# Patient Record
Sex: Female | Born: 1970 | Race: White | Hispanic: No | Smoking: Former smoker
Health system: Southern US, Community
[De-identification: ages and names within clinical notes are randomized; demographics above are authoritative.]

## PROBLEM LIST (undated history)

## (undated) DIAGNOSIS — R112 Nausea with vomiting, unspecified: Secondary | ICD-10-CM

## (undated) DIAGNOSIS — F419 Anxiety disorder, unspecified: Secondary | ICD-10-CM

## (undated) DIAGNOSIS — M1990 Unspecified osteoarthritis, unspecified site: Secondary | ICD-10-CM

## (undated) DIAGNOSIS — Z9889 Other specified postprocedural states: Secondary | ICD-10-CM

## (undated) DIAGNOSIS — M519 Unspecified thoracic, thoracolumbar and lumbosacral intervertebral disc disorder: Secondary | ICD-10-CM

## (undated) DIAGNOSIS — T7840XA Allergy, unspecified, initial encounter: Secondary | ICD-10-CM

## (undated) DIAGNOSIS — I10 Essential (primary) hypertension: Secondary | ICD-10-CM

## (undated) DIAGNOSIS — F32A Depression, unspecified: Secondary | ICD-10-CM

## (undated) DIAGNOSIS — D219 Benign neoplasm of connective and other soft tissue, unspecified: Secondary | ICD-10-CM

## (undated) DIAGNOSIS — J45909 Unspecified asthma, uncomplicated: Secondary | ICD-10-CM

## (undated) DIAGNOSIS — R32 Unspecified urinary incontinence: Secondary | ICD-10-CM

## (undated) DIAGNOSIS — Z8739 Personal history of other diseases of the musculoskeletal system and connective tissue: Secondary | ICD-10-CM

## (undated) DIAGNOSIS — N939 Abnormal uterine and vaginal bleeding, unspecified: Secondary | ICD-10-CM

## (undated) DIAGNOSIS — M75100 Unspecified rotator cuff tear or rupture of unspecified shoulder, not specified as traumatic: Secondary | ICD-10-CM

## (undated) DIAGNOSIS — K219 Gastro-esophageal reflux disease without esophagitis: Secondary | ICD-10-CM

## (undated) DIAGNOSIS — F329 Major depressive disorder, single episode, unspecified: Secondary | ICD-10-CM

## (undated) DIAGNOSIS — K5792 Diverticulitis of intestine, part unspecified, without perforation or abscess without bleeding: Secondary | ICD-10-CM

## (undated) DIAGNOSIS — R011 Cardiac murmur, unspecified: Secondary | ICD-10-CM

## (undated) HISTORY — DX: Unspecified thoracic, thoracolumbar and lumbosacral intervertebral disc disorder: M51.9

## (undated) HISTORY — PX: DIAGNOSTIC LAPAROSCOPY: SUR761

## (undated) HISTORY — PX: MOUTH SURGERY: SHX715

## (undated) HISTORY — PX: GYNECOLOGIC CRYOSURGERY: SHX857

## (undated) HISTORY — PX: PELVIC LAPAROSCOPY: SHX162

## (undated) HISTORY — PX: COLPOSCOPY: SHX161

## (undated) HISTORY — DX: Unspecified urinary incontinence: R32

## (undated) HISTORY — DX: Anxiety disorder, unspecified: F41.9

## (undated) HISTORY — PX: SHOULDER ARTHROSCOPY: SHX128

## (undated) HISTORY — PX: KNEE ARTHROSCOPY: SHX127

## (undated) HISTORY — PX: APPENDECTOMY: SHX54

## (undated) HISTORY — DX: Essential (primary) hypertension: I10

## (undated) HISTORY — DX: Abnormal uterine and vaginal bleeding, unspecified: N93.9

## (undated) HISTORY — DX: Unspecified rotator cuff tear or rupture of unspecified shoulder, not specified as traumatic: M75.100

## (undated) HISTORY — DX: Unspecified asthma, uncomplicated: J45.909

## (undated) HISTORY — DX: Personal history of other diseases of the musculoskeletal system and connective tissue: Z87.39

## (undated) HISTORY — DX: Diverticulitis of intestine, part unspecified, without perforation or abscess without bleeding: K57.92

## (undated) HISTORY — DX: Allergy, unspecified, initial encounter: T78.40XA

## (undated) HISTORY — PX: ANOPLASTY: SHX426

## (undated) HISTORY — DX: Depression, unspecified: F32.A

## (undated) HISTORY — DX: Cardiac murmur, unspecified: R01.1

## (undated) HISTORY — DX: Benign neoplasm of connective and other soft tissue, unspecified: D21.9

## (undated) HISTORY — DX: Major depressive disorder, single episode, unspecified: F32.9

## (undated) SURGERY — Surgical Case
Anesthesia: *Unknown

---

## 1998-03-23 ENCOUNTER — Other Ambulatory Visit: Admission: RE | Admit: 1998-03-23 | Discharge: 1998-03-23 | Payer: Self-pay | Admitting: Obstetrics & Gynecology

## 1998-06-19 ENCOUNTER — Other Ambulatory Visit: Admission: RE | Admit: 1998-06-19 | Discharge: 1998-06-19 | Payer: Self-pay | Admitting: Obstetrics & Gynecology

## 1999-05-14 ENCOUNTER — Other Ambulatory Visit: Admission: RE | Admit: 1999-05-14 | Discharge: 1999-05-14 | Payer: Self-pay | Admitting: Obstetrics and Gynecology

## 2000-04-22 ENCOUNTER — Other Ambulatory Visit: Admission: RE | Admit: 2000-04-22 | Discharge: 2000-04-22 | Payer: Self-pay | Admitting: Obstetrics and Gynecology

## 2002-08-30 ENCOUNTER — Other Ambulatory Visit: Admission: RE | Admit: 2002-08-30 | Discharge: 2002-08-30 | Payer: Self-pay | Admitting: Obstetrics and Gynecology

## 2002-12-22 ENCOUNTER — Observation Stay (HOSPITAL_COMMUNITY): Admission: AD | Admit: 2002-12-22 | Discharge: 2002-12-23 | Payer: Self-pay | Admitting: Obstetrics and Gynecology

## 2003-01-17 ENCOUNTER — Inpatient Hospital Stay (HOSPITAL_COMMUNITY): Admission: AD | Admit: 2003-01-17 | Discharge: 2003-01-19 | Payer: Self-pay | Admitting: Obstetrics and Gynecology

## 2003-01-17 ENCOUNTER — Encounter: Payer: Self-pay | Admitting: Obstetrics and Gynecology

## 2003-02-03 ENCOUNTER — Inpatient Hospital Stay (HOSPITAL_COMMUNITY): Admission: AD | Admit: 2003-02-03 | Discharge: 2003-02-05 | Payer: Self-pay | Admitting: Obstetrics and Gynecology

## 2003-02-05 ENCOUNTER — Encounter: Payer: Self-pay | Admitting: Obstetrics and Gynecology

## 2003-02-25 ENCOUNTER — Inpatient Hospital Stay (HOSPITAL_COMMUNITY): Admission: AD | Admit: 2003-02-25 | Discharge: 2003-02-25 | Payer: Self-pay | Admitting: Obstetrics and Gynecology

## 2003-02-26 ENCOUNTER — Inpatient Hospital Stay (HOSPITAL_COMMUNITY): Admission: AD | Admit: 2003-02-26 | Discharge: 2003-03-02 | Payer: Self-pay | Admitting: Obstetrics and Gynecology

## 2003-03-03 ENCOUNTER — Encounter: Admission: RE | Admit: 2003-03-03 | Discharge: 2003-04-02 | Payer: Self-pay | Admitting: Obstetrics and Gynecology

## 2003-03-30 ENCOUNTER — Other Ambulatory Visit: Admission: RE | Admit: 2003-03-30 | Discharge: 2003-03-30 | Payer: Self-pay | Admitting: Obstetrics and Gynecology

## 2004-01-27 ENCOUNTER — Other Ambulatory Visit: Admission: RE | Admit: 2004-01-27 | Discharge: 2004-01-27 | Payer: Self-pay | Admitting: Obstetrics and Gynecology

## 2004-08-26 ENCOUNTER — Emergency Department (HOSPITAL_COMMUNITY): Admission: EM | Admit: 2004-08-26 | Discharge: 2004-08-26 | Payer: Self-pay | Admitting: Emergency Medicine

## 2005-01-04 ENCOUNTER — Other Ambulatory Visit: Admission: RE | Admit: 2005-01-04 | Discharge: 2005-01-04 | Payer: Self-pay | Admitting: Obstetrics and Gynecology

## 2005-04-09 ENCOUNTER — Ambulatory Visit (HOSPITAL_COMMUNITY): Admission: RE | Admit: 2005-04-09 | Discharge: 2005-04-09 | Payer: Self-pay | Admitting: Obstetrics and Gynecology

## 2005-04-18 ENCOUNTER — Inpatient Hospital Stay (HOSPITAL_COMMUNITY): Admission: AD | Admit: 2005-04-18 | Discharge: 2005-04-18 | Payer: Self-pay | Admitting: Obstetrics and Gynecology

## 2005-08-08 ENCOUNTER — Ambulatory Visit: Payer: Self-pay | Admitting: Internal Medicine

## 2005-08-13 ENCOUNTER — Ambulatory Visit: Payer: Self-pay

## 2005-08-15 ENCOUNTER — Ambulatory Visit: Payer: Self-pay

## 2005-09-01 ENCOUNTER — Inpatient Hospital Stay (HOSPITAL_COMMUNITY): Admission: AD | Admit: 2005-09-01 | Discharge: 2005-09-04 | Payer: Self-pay | Admitting: Obstetrics and Gynecology

## 2005-10-07 ENCOUNTER — Other Ambulatory Visit: Admission: RE | Admit: 2005-10-07 | Discharge: 2005-10-07 | Payer: Self-pay | Admitting: Obstetrics and Gynecology

## 2006-09-08 ENCOUNTER — Encounter: Payer: Self-pay | Admitting: Orthopedic Surgery

## 2006-09-08 ENCOUNTER — Ambulatory Visit (HOSPITAL_COMMUNITY): Admission: RE | Admit: 2006-09-08 | Discharge: 2006-09-08 | Payer: Self-pay | Admitting: Orthopedic Surgery

## 2006-11-05 ENCOUNTER — Ambulatory Visit (HOSPITAL_COMMUNITY): Admission: RE | Admit: 2006-11-05 | Discharge: 2006-11-05 | Payer: Self-pay | Admitting: Orthopedic Surgery

## 2006-11-21 ENCOUNTER — Ambulatory Visit (HOSPITAL_COMMUNITY): Admission: RE | Admit: 2006-11-21 | Discharge: 2006-11-21 | Payer: Self-pay | Admitting: Orthopedic Surgery

## 2009-05-26 DIAGNOSIS — R0602 Shortness of breath: Secondary | ICD-10-CM | POA: Insufficient documentation

## 2009-05-26 DIAGNOSIS — R011 Cardiac murmur, unspecified: Secondary | ICD-10-CM | POA: Insufficient documentation

## 2009-05-26 DIAGNOSIS — J309 Allergic rhinitis, unspecified: Secondary | ICD-10-CM | POA: Insufficient documentation

## 2009-05-26 DIAGNOSIS — D649 Anemia, unspecified: Secondary | ICD-10-CM | POA: Insufficient documentation

## 2009-05-26 DIAGNOSIS — R002 Palpitations: Secondary | ICD-10-CM | POA: Insufficient documentation

## 2009-07-12 ENCOUNTER — Ambulatory Visit (HOSPITAL_COMMUNITY): Admission: RE | Admit: 2009-07-12 | Discharge: 2009-07-12 | Payer: Self-pay | Admitting: Obstetrics

## 2009-08-18 ENCOUNTER — Encounter: Admission: RE | Admit: 2009-08-18 | Discharge: 2009-08-18 | Payer: Self-pay | Admitting: Family Medicine

## 2011-01-13 ENCOUNTER — Encounter: Payer: Self-pay | Admitting: Orthopaedic Surgery

## 2011-01-14 ENCOUNTER — Encounter: Payer: Self-pay | Admitting: Family Medicine

## 2011-05-10 NOTE — H&P (Signed)
NAME:  Patricia Newton, Patricia Newton NO.:  [id number]   MEDICAL RECORD NO.:  [id number]                   PATIENT TYPE:   LOCATION:                                       FACILITY:   PHYSICIAN:  Juluis Mire, M.D.                DATE OF BIRTH:  10/01/1955   DATE OF ADMISSION:  01/17/2003  DATE OF DISCHARGE:                                HISTORY & PHYSICAL   HISTORY OF PRESENT ILLNESS:  This is a 40 year old gravida 1, para 0,  married white female, last menstrual period of 06/17/02, giving an estimated  date of confinement of 03/27/03.  This gives her an estimated gestational age  of 32-1/2 weeks.  This is consistent with initial examination here in the  office at nine weeks, and prior early ultrasound done at 17 weeks.  She is  admitted at the present time with preterm uterine activity requiring  tocolysis.   In relation to the present admission, the patient called the office  complaining of increasing back ache and discomfort.  Was subsequently seen  in triage where regular uterine activity was noted.  Her cervix remained  fingertip and long.  Despite subcutaneous and p.o. terbutaline, she  continued to have regular uterine activity, and was admitted for magnesium  sulfate.  There was no documented cervical change during this time.  It is  of note at her last visit in the office on 01/17/03, it was noted that she  had upper limits of normal amniotic fluid consistent with mild  polyhydramnios.  This may be somewhat related to her preterm uterine  activity.  No further evaluation had been undertaken at that time.  Other  issues, she did have an elevated 50 g Glucola.  Her three hour glucose  tolerance test was within normal limits.   ALLERGIES:  1. PENICILLIN.  2. ERYTHROMYCIN.  3. BENADRYL.   MEDICATIONS:  1. Prenatal vitamins.  2. Terbutaline p.r.n.   PAST MEDICAL HISTORY:  Please see prenatal record.  She does have a history  of a cardiac murmur, and  does not take SBE prophylaxis.   FAMILY HISTORY:  Please see prenatal record.   SOCIAL HISTORY:  Please see prenatal record.   REVIEW OF SYMPTOMS:  Noncontributory.   PHYSICAL EXAMINATION:  VITAL SIGNS:  The patient is afebrile with stable  vital signs.  HEENT:  Normocephalic, atraumatic.  Pupils equal, round, reactive to light  and accommodation.  Extraocular movements were intact.  Sclerae and  conjunctivae are clear.  Oropharynx clear.  NECK:  Without thyromegaly.  BREASTS:  Not examined.  LUNGS:  Clear.  HEART:  Regular rate, 2/6 systolic ejection murmur, no clicks or gallops.  ABDOMEN:  Gravida uterus, slightly increased compared to dates.  Nontender.  Fetal heart tones are audible.  On monitoring, she does have regular uterine  activity, fetal heart is reactive with no decelerations.  PELVIC:  Cervix is long  and fingertip.  Membranes are intact.  No active  bleeding.  EXTREMITIES:  Trace edema.  NEUROLOGIC:  Grossly within normal limits.  Deep tendon reflexes are 2+, no  clonus.   IMPRESSION:  1. Intrauterine pregnancy at 32+ weeks with pre-term uterine activity.  2. Polyhydramnios.   PLAN:  The patient will be admitted for tocolysis and magnesium sulfate.  She will be given IV Cleocin.  Group B Strep cultures obtained.  We will  obtain a followup ultrasound and reassess.                                                 Juluis Mire, M.D.    JSM/MEDQ  D:  01/18/2003  T:  01/18/2003  Job:  [id number]

## 2011-05-10 NOTE — Discharge Summary (Signed)
   NAMEAAMNA, MALLOZZI NO.:  [id number]   MEDICAL RECORD NO.:  [id number]                   PATIENT TYPE:  INP   LOCATION:  9160                                 FACILITY:  [facility]   PHYSICIAN:  Juluis Mire, M.D.                DATE OF BIRTH:  March 24, 1971   DATE OF ADMISSION:  02/03/2003  DATE OF DISCHARGE:  02/05/2003                                 DISCHARGE SUMMARY   ADMITTING DIAGNOSIS:  Intrauterine pregnancy at 14 and 6/7ths weeks with  preterm uterine activity.   DISCHARGE DIAGNOSIS:  Intrauterine pregnancy at 59 and 6/7ths weeks with  preterm uterine activity.   HISTORY AND PHYSICAL:  For complete history and physical, see dictated note.   COURSE IN HOSPITAL:  The patient was admitted for preterm management, and  begun on magnesium sulfate.  Was given betamethasone and IV antibiotics.  Subsequently was converted over to terbutaline pump with good resolution of  uterine activity and weaned of magnesium.  On the morning of discharge,  fetal heart rate did have one deceleration with contraction.  Subsequent  biophysical profile was 8/8 with normal Doppler flow studies and normal  amniotic fluid volume.  The remaining tracing looked reactive without  decelerations.  The patient was discharged home at that time to continue  terbutaline pump at home.   There were no complications during the patient's stay in the hospital.  The  patient was discharged home in stable condition.   The patient will continue on terbutaline pump at home.  She is to be at  rest.  Call with increase in uterine activity, preterm labor.  Warning signs  were given.  Will follow up in the office on Tuesday.                                               Juluis Mire, M.D.    JSM/MEDQ  D:  02/05/2003  T:  02/05/2003  Job:  [id number]

## 2011-05-10 NOTE — H&P (Signed)
   NAME:  BERLIN, VIERECK NO.:  [id number]   MEDICAL RECORD NO.:  [id number]                   PATIENT TYPE:   LOCATION:                                       [facility]ILITY:  [facility]   PHYSICIAN:  Dineen Kid. Rana Snare, M.D.                 DATE OF BIRTH:  14-Sep-1971   DATE OF ADMISSION:  DATE OF DISCHARGE:                                HISTORY & PHYSICAL   HISTORY OF PRESENT ILLNESS:  The patient is a 40 year old G1, P0 at 74 and  6/7 weeks who presented to the office complaining of cramping.  Pregnancy  has been complicated by preterm labor and polyhydramnios.  Ultrasound on  January 31, 2003 shows amniotic fluid index at 27.1 which is 97th  percentile.  She has also been on bed rest and terbutaline q.4h.  In the  office she seemed to be contracting every five to 10 minutes with  irritability in between.  Upon presentation to [facility] she was  given subcutaneous terbutaline twice with only minimal resolution of the  irritability and contractions.  Her cervix was 1, 25%, and high and the  patient was admitted for magnesium sulfate due to persistent contractions  despite terbutaline.  Pregl in pregnancy she had a fetal fibronectin which  was negative.  She has not had one done recently and unknown group B Strep.  However, she was treated with antibiotics last admission.  Estimated date of  confinement is March 27, 2003.   ALLERGIES:  PENICILLIN, BENADRYL, and ERYTHROMYCIN.   CURRENT MEDICATIONS:  1. Terbutaline 2.5 q.4h.  2. Prenatal vitamins.   PHYSICAL EXAMINATION:  VITAL SIGNS:  Blood pressure 122/60.  HEART:  Regular rate and rhythm.  ABDOMEN:  Gravid, nontender.  Fundal height is 35 cm.  PELVIC:  Cervix is 1, 25%, and high.   IMPRESSION AND PLAN:  Intrauterine pregnancy at 68 and 6/7 weeks with  preterm labor not responsive to terbutaline.   PLAN:  Magnesium sulfate tocolysis.  Once quiescent plan to switch her to  terbutaline pump.  Will go ahead  and give her betamethasone shots for  acceleration of fetal lung maturity and will check a group B Strep culture  and treat her with clindamycin prophylactically until the results return.                                               Dineen Kid Rana Snare, M.D.    DCL/MEDQ  D:  02/03/2003  T:  02/03/2003  Job:  [id number]

## 2011-05-10 NOTE — Discharge Summary (Signed)
NAMERIVERLYN, Patricia Newton                       ACCOUNT NO.:  [id number]   MEDICAL RECORD NO.:  [id number]                   PATIENT TYPE:  OBV   LOCATION:  9110                                 FACILITY:  [facility]   PHYSICIAN:  Duke Salvia. Marcelle Overlie, M.D.            DATE OF BIRTH:  February 14, 1971   DATE OF ADMISSION:  12/22/2002  DATE OF DISCHARGE:  12/23/2002                                 DISCHARGE SUMMARY   ADMISSION DIAGNOSES:  1. Intrauterine pregnancy at 26-1/2 weeks.  2. Acute gastroenteritis, bacterial versus viral.   HISTORY OF PRESENT ILLNESS:  Please see the admission H&P.  Briefly, a 40-  year-old at 26-1/2 weeks with sudden onset of nausea, vomiting, diarrhea and  abdominal cramping after eating out at a restaurant earlier the day of  admission.  She had some brief bloody, mucousy diarrhea.   HOSPITAL COURSE:  Her temperature on admission was 99.8.  An IV was started.  She was given anti-emetics and started on Septra for possible Salmonella,  and stool was sent for ova and parasites.  Her main problem during  hospitalization was some abdominal cramping although by the a.m. of December 23, 2001, her diarrhea pretty much stopped.  She was having some abdominal  cramping but was tolerating liquids at that point with only occasional IV  Phenergan dose for nausea.  She did receive Donnatal elixir that she was  using for colon cramps which did seem to help her significantly.  By the  afternoon of December 23, 2001, at 5:30 p.m. she was ready for discharge.  She  was afebrile at that point, keeping liquids down with stable fetal heart  rate with only some mild irritability that had all but stopped.   LABORATORY DATA:  CBC on admission, WBC 12.1, hemoglobin 10.7, hematocrit  31.1, platelets 215,000.  SMA-23 was normal including normal amylase and  lipase.  Stool for ova and parasites pending.  Urinalysis showed 500 glucose  with 40 ketones, otherwise negative.   DISPOSITION:  The patient  is discharged on Septra DS one p.o. b.i.d. x7 days  pending the stool culture.  She was advised a bland diet and liquids  including sports drinks to replenish sodium and potassium.  Phenergan p.r.n.  nausea and Donnatal elixir 60 cc  - 5 cc p.o. t.i.d. for abdominal cramps.  She will return to the office in one week.  Advised to report any recurrence  of significant nausea, vomiting or bloody diarrhea or increased uterine  irritability greater than four to five contractions per hour.   CONDITION ON DISCHARGE:  Good.   ACTIVITY:  Rest at home with graded increase in her activity.                                               Richard  Milana Obey, M.D.    RMH/MEDQ  D:  12/23/2002  T:  12/23/2002  Job:  [id number]

## 2012-03-09 ENCOUNTER — Other Ambulatory Visit: Payer: Self-pay | Admitting: Orthopedic Surgery

## 2012-03-10 ENCOUNTER — Encounter (HOSPITAL_BASED_OUTPATIENT_CLINIC_OR_DEPARTMENT_OTHER): Payer: Self-pay | Admitting: *Deleted

## 2012-03-10 NOTE — Progress Notes (Signed)
Pt states she had some irreg hr-palpatationd during preg-last 06-has had some occ-has not had to see a cardiologist. Denies cp-sob No labs needed

## 2012-03-11 ENCOUNTER — Ambulatory Visit (HOSPITAL_BASED_OUTPATIENT_CLINIC_OR_DEPARTMENT_OTHER): Payer: Managed Care, Other (non HMO) | Admitting: Anesthesiology

## 2012-03-11 ENCOUNTER — Encounter (HOSPITAL_BASED_OUTPATIENT_CLINIC_OR_DEPARTMENT_OTHER): Payer: Self-pay | Admitting: Anesthesiology

## 2012-03-11 ENCOUNTER — Encounter (HOSPITAL_BASED_OUTPATIENT_CLINIC_OR_DEPARTMENT_OTHER)
Admission: RE | Disposition: A | Discharge status: Home / Self Care | Payer: Self-pay | Source: Ambulatory Visit | Attending: Orthopedic Surgery

## 2012-03-11 ENCOUNTER — Encounter (HOSPITAL_BASED_OUTPATIENT_CLINIC_OR_DEPARTMENT_OTHER): Payer: Self-pay | Admitting: Orthopedic Surgery

## 2012-03-11 ENCOUNTER — Ambulatory Visit (HOSPITAL_BASED_OUTPATIENT_CLINIC_OR_DEPARTMENT_OTHER)
Admission: RE | Admit: 2012-03-11 | Discharge: 2012-03-11 | Disposition: A | Discharge status: Home / Self Care | Payer: Managed Care, Other (non HMO) | Source: Ambulatory Visit | Attending: Orthopedic Surgery | Admitting: Orthopedic Surgery

## 2012-03-11 DIAGNOSIS — W269XXA Contact with unspecified sharp object(s), initial encounter: Secondary | ICD-10-CM | POA: Insufficient documentation

## 2012-03-11 DIAGNOSIS — S61209A Unspecified open wound of unspecified finger without damage to nail, initial encounter: Secondary | ICD-10-CM | POA: Insufficient documentation

## 2012-03-11 HISTORY — DX: Other specified postprocedural states: Z98.890

## 2012-03-11 HISTORY — DX: Other specified postprocedural states: R11.2

## 2012-03-11 HISTORY — PX: TENDON EXPLORATION: SHX5112

## 2012-03-11 HISTORY — PX: OTHER SURGICAL HISTORY: SHX169

## 2012-03-11 HISTORY — DX: Unspecified osteoarthritis, unspecified site: M19.90

## 2012-03-11 SURGERY — EXPLORATION, TENDON
Anesthesia: General | Site: Finger | Laterality: Left | Wound class: Clean

## 2012-03-11 MED ORDER — FENTANYL CITRATE 0.05 MG/ML IJ SOLN: 50.0000 ug | INTRAMUSCULAR | Status: DC | PRN

## 2012-03-11 MED ORDER — LORAZEPAM 2 MG/ML IJ SOLN: 1.0000 mg | Freq: Once | INTRAMUSCULAR | Status: DC | PRN

## 2012-03-11 MED ORDER — MIDAZOLAM HCL 5 MG/5ML IJ SOLN
INTRAMUSCULAR | Status: DC | PRN
  Administered 2012-03-11: 2 mg via INTRAVENOUS

## 2012-03-11 MED ORDER — MIDAZOLAM HCL 2 MG/2ML IJ SOLN: 1.0000 mg | INTRAMUSCULAR | Status: DC | PRN

## 2012-03-11 MED ORDER — HYDROMORPHONE HCL PF 1 MG/ML IJ SOLN
0.2500 mg | INTRAMUSCULAR | Status: DC | PRN
  Administered 2012-03-11 (×2): 0.5 mg via INTRAVENOUS

## 2012-03-11 MED ORDER — SCOPOLAMINE 1 MG/3DAYS TD PT72
1.0000 | MEDICATED_PATCH | Freq: Once | TRANSDERMAL | Status: DC
  Administered 2012-03-11: 1.5 mg via TRANSDERMAL

## 2012-03-11 MED ORDER — CEFAZOLIN SODIUM 1-5 GM-% IV SOLN
INTRAVENOUS | Status: DC | PRN
  Administered 2012-03-11: 2 g via INTRAVENOUS

## 2012-03-11 MED ORDER — BUPIVACAINE HCL (PF) 0.25 % IJ SOLN
INTRAMUSCULAR | Status: DC | PRN
  Administered 2012-03-11: 5 mL

## 2012-03-11 MED ORDER — CHLORHEXIDINE GLUCONATE 4 % EX LIQD: 60.0000 mL | Freq: Once | CUTANEOUS | Status: DC

## 2012-03-11 MED ORDER — LACTATED RINGERS IV SOLN
INTRAVENOUS | Status: DC
  Administered 2012-03-11 (×3): via INTRAVENOUS

## 2012-03-11 MED ORDER — LIDOCAINE HCL (CARDIAC) 20 MG/ML IV SOLN
INTRAVENOUS | Status: DC | PRN
  Administered 2012-03-11: 80 mg via INTRAVENOUS

## 2012-03-11 MED ORDER — HYDROCODONE-ACETAMINOPHEN 5-500 MG PO TABS: 1.0000 | ORAL_TABLET | ORAL | Status: AC | PRN

## 2012-03-11 MED ORDER — FENTANYL CITRATE 0.05 MG/ML IJ SOLN
INTRAMUSCULAR | Status: DC | PRN
  Administered 2012-03-11: 100 ug via INTRAVENOUS

## 2012-03-11 MED ORDER — SCOPOLAMINE 1 MG/3DAYS TD PT72: 1.0000 | MEDICATED_PATCH | Freq: Once | TRANSDERMAL | Status: DC

## 2012-03-11 SURGICAL SUPPLY — 77 items
BAG DECANTER FOR FLEXI CONT (MISCELLANEOUS) IMPLANT
BALL CTTN LRG ABS STRL LF (GAUZE/BANDAGES/DRESSINGS)
BANDAGE GAUZE ELAST BULKY 4 IN (GAUZE/BANDAGES/DRESSINGS) ×2 IMPLANT
BLADE MINI RND TIP GREEN BEAV (BLADE) IMPLANT
BLADE SURG 15 STRL LF DISP TIS (BLADE) ×1 IMPLANT
BLADE SURG 15 STRL SS (BLADE) ×2
BNDG CMPR 9X4 STRL LF SNTH (GAUZE/BANDAGES/DRESSINGS) ×1
BNDG COHESIVE 3X5 TAN STRL LF (GAUZE/BANDAGES/DRESSINGS) ×2 IMPLANT
BNDG ESMARK 4X9 LF (GAUZE/BANDAGES/DRESSINGS) ×1 IMPLANT
CHLORAPREP W/TINT 26ML (MISCELLANEOUS) ×2 IMPLANT
CLOTH BEACON ORANGE TIMEOUT ST (SAFETY) ×2 IMPLANT
CORDS BIPOLAR (ELECTRODE) ×2 IMPLANT
COTTONBALL LRG STERILE PKG (GAUZE/BANDAGES/DRESSINGS) IMPLANT
COVER MAYO STAND STRL (DRAPES) ×2 IMPLANT
COVER TABLE BACK 60X90 (DRAPES) ×2 IMPLANT
CUFF TOURNIQUET SINGLE 18IN (TOURNIQUET CUFF) ×1 IMPLANT
DECANTER SPIKE VIAL GLASS SM (MISCELLANEOUS) IMPLANT
DRAIN TLS ROUND 10FR (DRAIN) IMPLANT
DRAPE EXTREMITY T 121X128X90 (DRAPE) ×2 IMPLANT
DRAPE OEC MINIVIEW 54X84 (DRAPES) IMPLANT
DRAPE SURG 17X23 STRL (DRAPES) ×2 IMPLANT
DRSG KUZMA FLUFF (GAUZE/BANDAGES/DRESSINGS) IMPLANT
GAUZE SPONGE 4X4 16PLY XRAY LF (GAUZE/BANDAGES/DRESSINGS) IMPLANT
GAUZE XEROFORM 1X8 LF (GAUZE/BANDAGES/DRESSINGS) ×2 IMPLANT
GLOVE BIO SURGEON STRL SZ 6.5 (GLOVE) ×3 IMPLANT
GLOVE BIOGEL PI IND STRL 6.5 (GLOVE) IMPLANT
GLOVE BIOGEL PI INDICATOR 6.5 (GLOVE) ×1
GLOVE SURG ORTHO 8.0 STRL STRW (GLOVE) ×2 IMPLANT
GOWN BRE IMP PREV XXLGXLNG (GOWN DISPOSABLE) ×2 IMPLANT
GOWN PREVENTION PLUS XLARGE (GOWN DISPOSABLE) ×3 IMPLANT
KWIRE 4.0 X .035IN (WIRE) IMPLANT
LOOP VESSEL MAXI BLUE (MISCELLANEOUS) IMPLANT
NDL KEITH (NEEDLE) IMPLANT
NEEDLE 27GAX1X1/2 (NEEDLE) ×1 IMPLANT
NEEDLE HYPO 22GX1.5 SAFETY (NEEDLE) IMPLANT
NEEDLE KEITH (NEEDLE) IMPLANT
NS IRRIG 1000ML POUR BTL (IV SOLUTION) ×2 IMPLANT
PACK BASIN DAY SURGERY FS (CUSTOM PROCEDURE TRAY) ×2 IMPLANT
PAD CAST 3X4 CTTN HI CHSV (CAST SUPPLIES) ×1 IMPLANT
PADDING CAST ABS 3INX4YD NS (CAST SUPPLIES)
PADDING CAST ABS 4INX4YD NS (CAST SUPPLIES)
PADDING CAST ABS COTTON 3X4 (CAST SUPPLIES) IMPLANT
PADDING CAST ABS COTTON 4X4 ST (CAST SUPPLIES) ×1 IMPLANT
PADDING CAST COTTON 3X4 STRL (CAST SUPPLIES) ×2
SLEEVE SCD COMPRESS KNEE MED (MISCELLANEOUS) ×1 IMPLANT
SPLINT PLASTER CAST XFAST 3X15 (CAST SUPPLIES) IMPLANT
SPLINT PLASTER XTRA FASTSET 3X (CAST SUPPLIES)
SPONGE GAUZE 4X4 12PLY (GAUZE/BANDAGES/DRESSINGS) ×2 IMPLANT
STOCKINETTE 4X48 STRL (DRAPES) ×2 IMPLANT
SUT CHROMIC 5 0 P 3 (SUTURE) IMPLANT
SUT ETHIBOND 3-0 V-5 (SUTURE) IMPLANT
SUT FIBERWIRE 2-0 18 17.9 3/8 (SUTURE)
SUT FIBERWIRE 4-0 18 TAPR NDL (SUTURE)
SUT MERSILENE 2.0 SH NDLE (SUTURE) IMPLANT
SUT MERSILENE 3 0 FS 1 (SUTURE) IMPLANT
SUT MERSILENE 4 0 P 3 (SUTURE) IMPLANT
SUT POLY BUTTON 15MM (SUTURE) IMPLANT
SUT PROLENE 2 0 SH DA (SUTURE) IMPLANT
SUT SILK 2 0 FS (SUTURE) IMPLANT
SUT SILK 4 0 PS 2 (SUTURE) IMPLANT
SUT STEEL 3 0 (SUTURE) IMPLANT
SUT STEEL 4 0 V 26 (SUTURE) IMPLANT
SUT VIC AB 3-0 PS1 18 (SUTURE)
SUT VIC AB 3-0 PS1 18XBRD (SUTURE) IMPLANT
SUT VIC AB 4-0 P-3 18XBRD (SUTURE) IMPLANT
SUT VIC AB 4-0 P3 18 (SUTURE)
SUT VICRYL 4-0 PS2 18IN ABS (SUTURE) IMPLANT
SUT VICRYL RAPID 5 0 P 3 (SUTURE) IMPLANT
SUT VICRYL RAPIDE 4/0 PS 2 (SUTURE) ×2 IMPLANT
SUTURE FIBERWR 2-0 18 17.9 3/8 (SUTURE) IMPLANT
SUTURE FIBERWR 4-0 18 TAPR NDL (SUTURE) IMPLANT
SYR BULB 3OZ (MISCELLANEOUS) ×2 IMPLANT
SYR CONTROL 10ML LL (SYRINGE) ×1 IMPLANT
TOWEL OR 17X24 6PK STRL BLUE (TOWEL DISPOSABLE) ×4 IMPLANT
TUBE FEEDING 5FR 15 INCH (TUBING) IMPLANT
UNDERPAD 30X30 INCONTINENT (UNDERPADS AND DIAPERS) ×2 IMPLANT
WATER STERILE IRR 1000ML POUR (IV SOLUTION) ×1 IMPLANT

## 2012-03-11 NOTE — H&P (Signed)
  Patricia Newton  suffered a laceration of her index finger MCP joint . She was seen at Urgent Care. They steri stripped this. They did not give her any pain medicine or antibiotics. She is complaining of pain at the area of the laceration and pain to resisted flexion of her finger. Sensation is intact. She has no prior history of injury. She has pain with resisted flexion of her finger.    ALLERGIES:   E-mycin, vancomycin.  MEDICATIONS:    Ibuprofen.   SURGICAL HISTORY:    She has had knee surgery, shoulder surgery, appendectomy, and laparoscopy.    FAMILY MEDICAL HISTORY:   Positive for diabetes, heart disease and arthritis.  SOCIAL HISTORY:    She smokes eight cigarettes a day and is advised to quit. She does not drink.  She is single. She works as a Facilities manager.  REVIEW OF SYSTEMS:    Positive for glasses, contacts, otherwise negative 14 points. Patricia Newton is an 41 y.o. female.   Chief Complaint: laceration lt index HPI: see above  Past Medical History  Diagnosis Date  . PONV (postoperative nausea and vomiting)   . Arthritis   . No pertinent past medical history     Past Surgical History  Procedure Date  . Diagnostic laparoscopy     x3 for endometriosis  . Appendectomy   . Shoulder arthroscopy right  . Knee arthroscopy     right-x3    History reviewed. No pertinent family history. Social History:  reports that she has been smoking.  She does not have any smokeless tobacco history on file. She reports that she drinks alcohol. She reports that she does not use illicit drugs.  Allergies:  Allergies  Allergen Reactions  . Vancomycin Cross Reactors Hives    No current facility-administered medications on file as of .   Medications Prior to Admission  Medication Sig Dispense Refill  . traMADol (ULTRAM) 50 MG tablet Take 50 mg by mouth every 6 (six) hours as needed.      . trazodone (DESYREL) 300 MG tablet Take 300 mg by mouth at bedtime.        No  results found for this or any previous visit (from the past 48 hour(s)).  No results found.   Pertinent items are noted in HPI.  Last menstrual period 02/14/2012.  General appearance: alert, cooperative and appears stated age Head: Normocephalic, without obvious abnormality Neck: no adenopathy Resp: clear to auscultation bilaterally Cardio: regular rate and rhythm, S1, S2 normal, no murmur, click, rub or gallop GI: soft, non-tender; bowel sounds normal; no masses,  no organomegaly Extremities: extremities normal, atraumatic, no cyanosis or edema Pulses: 2+ and symmetric Skin: Skin color, texture, turgor normal. No rashes or lesions Neurologic: Grossly normal Incision/Wound: na  Assessment/Plan I am concerned that she may have a partial laceration of the superficialis tendon. This  requires exploration repair. We will place her on Septra for this  Kamarri Lovvorn R 03/11/2012, 7:39 AM

## 2012-03-11 NOTE — Anesthesia Procedure Notes (Signed)
Procedure Name: LMA Insertion Date/Time: 03/11/2012 12:50 PM Performed by: Gar Gibbon Pre-anesthesia Checklist: Patient identified, Emergency Drugs available, Suction available and Patient being monitored Patient Re-evaluated:Patient Re-evaluated prior to inductionOxygen Delivery Method: Circle system utilized Preoxygenation: Pre-oxygenation with 100% oxygen Intubation Type: IV induction Ventilation: Mask ventilation without difficulty LMA: LMA inserted LMA Size: 4.0 Number of attempts: 1 Placement Confirmation: positive ETCO2 and breath sounds checked- equal and bilateral Tube secured with: Tape Dental Injury: Teeth and Oropharynx as per pre-operative assessment

## 2012-03-11 NOTE — Brief Op Note (Signed)
03/11/2012  1:14 PM  PATIENT:  Patricia Newton  41 y.o. female  PRE-OPERATIVE DIAGNOSIS:  LACERATION LEFT INDEX FINGER   POST-OPERATIVE DIAGNOSIS:  LACERATION LEFT INDEX FINGER  PROCEDURE:  Procedure(s) (LRB): TENDON EXPLORATION (Left)  SURGEON:  Surgeon(s) and Role:    * Nicki Reaper, MD - Primary  PHYSICIAN ASSISTANT:   ASSISTANTS: none   ANESTHESIA:   local and general  EBL:     BLOOD ADMINISTERED:none  DRAINS: none   LOCAL MEDICATIONS USED:  MARCAINE     SPECIMEN:  No Specimen  DISPOSITION OF SPECIMEN:  N/A  COUNTS:  YES  TOURNIQUET:   Total Tourniquet Time Documented: Upper Arm (Left) - 18 minutes  DICTATION: .Other Dictation: Dictation Number [id number]  PLAN OF CARE: Discharge to home after PACU  PATIENT DISPOSITION:  PACU - hemodynamically stable.

## 2012-03-11 NOTE — Discharge Instructions (Signed)
[facility] Instructions Hand Surgery  Wound Care: Keep your hand elevated above the level of your heart.  Do not allow it to dangle  by your side.  Keep the dressing dry and do not remove it unless your doctor advises you to do so.  He will usually change it at the time of your post-op visit.  Moving your fingers is advised to stimulate circulation but will depend on the site of your surgery.  If you have a splint applied, your doctor will advise you regarding movement.  Activity: Do not drive or operate machinery today.  Rest today and then you may return to your normal activity and work as indicated by your physician.  Diet:  Drink liquids today or eat a light diet.  You may resume a regular diet tomorrow.    General expectations: Pain for two to three days. Fingers may become slightly swollen.  Call your doctor if any of the following occur: Severe pain not relieved by pain medication. Elevated temperature. Dressing soaked with blood. Inability to move fingers. White or bluish color to fingers.[facility]  [address] [place], [place] [address] [phone]   Post Anesthesia Home Care Instructions  Activity: Get plenty of rest for the remainder of the day. A responsible adult should stay with you for 24 hours following the procedure.  For the next 24 hours, DO NOT: -Drive a car -Operate machinery -Drink alcoholic beverages -Take any medication unless instructed by your physician -Make any legal decisions or sign important papers.  Meals: Start with liquid foods such as gelatin or soup. Progress to regular foods as tolerated. Avoid greasy, spicy, heavy foods. If nausea and/or vomiting occur, drink only clear liquids until the nausea and/or vomiting subsides. Call your physician if vomiting continues.  Special Instructions/Symptoms: Your throat may feel dry or sore from the anesthesia or the breathing tube placed in your throat during surgery. If  this causes discomfort, gargle with warm salt water. The discomfort should disappear within 24 hours.   

## 2012-03-11 NOTE — Anesthesia Postprocedure Evaluation (Signed)
  Anesthesia Post-op Note  Patient: Patricia Newton  Procedure(s) Performed: Procedure(s) (LRB): TENDON EXPLORATION (Left)  Patient Location: PACU  Anesthesia Type: General  Level of Consciousness: awake  Airway and Oxygen Therapy: Patient Spontanous Breathing  Post-op Pain: mild  Post-op Assessment: Post-op Vital signs reviewed, Patient's Cardiovascular Status Stable, Respiratory Function Stable, Patent Airway, No signs of Nausea or vomiting and Pain level controlled  Post-op Vital Signs: stable  Complications: No apparent anesthesia complications

## 2012-03-11 NOTE — Op Note (Signed)
NAME:  Patricia Newton, Patricia Newton NO.:  [id number]  MEDICAL RECORD NO.:  [id number]  LOCATION:                                 FACILITY:  PHYSICIAN:  Cindee Salt, M.D.            DATE OF BIRTH:  DATE OF PROCEDURE:  03/11/2012 DATE OF DISCHARGE:                              OPERATIVE REPORT   PREOPERATIVE DIAGNOSIS:  Laceration, left index finger.  POSTOPERATIVE DIAGNOSIS:  Laceration, left index finger.  OPERATION:  Exploration of flexor tendons, artery, and nerve of left index finger.  SURGEON:  Cindee Salt, M.D.  ANESTHESIA:  General with local infiltration.  ANESTHESIOLOGIST:  Bedelia Person, M.D.  HISTORY:  The patient is a 41 year old female who suffered a laceration of her left index finger.  She complains of pain with resisted flexion of the superficialis tendon.  The laceration was over the volar-radial aspect of the metacarpophalangeal joint area of left index finger.  She was given a period of time to see if this would gradually resolved, but it is not.  She is admitted now for exploration of flexor tendons and possible repair dictated by finding.  In the preoperative area, the patient is seen, the extremity was marked by both the patient and surgeon.  Antibiotic given.  PROCEDURE:  The patient was brought to the operating room where a general anesthetic was carried out without difficulty.  She was prepped using ChloraPrep, supine position, left arm free.  A 3-minute dry time was allowed.  Time-out taken, confirming the patient and procedure.  The limb was exsanguinated with an Esmarch bandage.  Tourniquet placed high on the arm was inflated to 150 mmHg.  A volar Brunner incision was made, carried down through subcutaneous tissue.  Bleeders were electrocauterized with bipolar.  The laceration was directly over the digital nerve, this was found to be intact.  This was followed deep to the flexor tendons.  The flexor tendon sheath was opened.  The  tendons explored.  No partial laceration was noted.  There was a small area of granulation tissue in the radial limb of the superficialis tendon, but no laceration requiring repair.  The wound was copiously irrigated with saline.  The skin then closed with interrupted 4-0 Vicryl Rapide sutures.  Sterile compressive dressing was applied.  On deflation of the tourniquet, all fingers immediately pinked.  She was taken to the recovery room for observation in satisfactory condition.          ______________________________ Cindee Salt, M.D.     GK/MEDQ  D:  03/11/2012  T:  03/11/2012  Job:  [id number]

## 2012-03-11 NOTE — Transfer of Care (Signed)
Immediate Anesthesia Transfer of Care Note  Patient: Patricia Newton  Procedure(s) Performed: Procedure(s) (LRB): TENDON EXPLORATION (Left)  Patient Location: PACU  Anesthesia Type: General  Level of Consciousness: sedated  Airway & Oxygen Therapy: Patient Spontanous Breathing and Patient connected to face mask oxygen  Post-op Assessment: Report given to PACU RN  Post vital signs: Reviewed and stable  Complications: No apparent anesthesia complications

## 2012-03-11 NOTE — Op Note (Signed)
Dictated  Number: [id number]

## 2012-03-11 NOTE — Anesthesia Preprocedure Evaluation (Signed)
Anesthesia Evaluation  Patient identified by MRN, date of birth, ID band Patient awake    History of Anesthesia Complications (+) PONV  Airway Mallampati: I TM Distance: >3 FB Neck ROM: Full    Dental   Pulmonary shortness of breath, Current Smoker,  + rhonchi         Cardiovascular Rhythm:Regular Rate:Normal     Neuro/Psych    GI/Hepatic   Endo/Other    Renal/GU      Musculoskeletal   Abdominal   Peds  Hematology   Anesthesia Other Findings   Reproductive/Obstetrics                           Anesthesia Physical Anesthesia Plan  ASA: II  Anesthesia Plan: General   Post-op Pain Management:    Induction: Intravenous  Airway Management Planned: LMA  Additional Equipment:   Intra-op Plan:   Post-operative Plan: Extubation in OR  Informed Consent: I have reviewed the patients History and Physical, chart, labs and discussed the procedure including the risks, benefits and alternatives for the proposed anesthesia with the patient or authorized representative who has indicated his/her understanding and acceptance.     Plan Discussed with: CRNA and Surgeon  Anesthesia Plan Comments:         Anesthesia Quick Evaluation

## 2012-03-12 ENCOUNTER — Encounter (HOSPITAL_BASED_OUTPATIENT_CLINIC_OR_DEPARTMENT_OTHER): Payer: Self-pay | Admitting: Orthopedic Surgery

## 2012-03-16 ENCOUNTER — Encounter (HOSPITAL_BASED_OUTPATIENT_CLINIC_OR_DEPARTMENT_OTHER): Payer: Self-pay

## 2012-03-25 ENCOUNTER — Encounter (HOSPITAL_COMMUNITY): Payer: Self-pay | Admitting: Pharmacist

## 2012-03-25 ENCOUNTER — Encounter (HOSPITAL_COMMUNITY): Payer: Self-pay | Admitting: *Deleted

## 2012-03-26 ENCOUNTER — Ambulatory Visit (HOSPITAL_COMMUNITY)
Admission: RE | Admit: 2012-03-26 | Discharge: 2012-03-26 | Disposition: A | Discharge status: Home / Self Care | Payer: Managed Care, Other (non HMO) | Source: Ambulatory Visit | Attending: Obstetrics and Gynecology | Admitting: Obstetrics and Gynecology

## 2012-03-26 ENCOUNTER — Other Ambulatory Visit: Payer: Self-pay | Admitting: Obstetrics and Gynecology

## 2012-03-26 ENCOUNTER — Ambulatory Visit (HOSPITAL_COMMUNITY): Payer: Managed Care, Other (non HMO) | Admitting: Anesthesiology

## 2012-03-26 ENCOUNTER — Encounter (HOSPITAL_COMMUNITY)
Admission: RE | Disposition: A | Discharge status: Home / Self Care | Payer: Self-pay | Source: Ambulatory Visit | Attending: Obstetrics and Gynecology

## 2012-03-26 ENCOUNTER — Encounter (HOSPITAL_COMMUNITY): Payer: Self-pay | Admitting: Anesthesiology

## 2012-03-26 ENCOUNTER — Encounter (HOSPITAL_COMMUNITY): Payer: Self-pay | Admitting: *Deleted

## 2012-03-26 DIAGNOSIS — R102 Pelvic and perineal pain: Secondary | ICD-10-CM

## 2012-03-26 DIAGNOSIS — N949 Unspecified condition associated with female genital organs and menstrual cycle: Secondary | ICD-10-CM | POA: Insufficient documentation

## 2012-03-26 DIAGNOSIS — N938 Other specified abnormal uterine and vaginal bleeding: Secondary | ICD-10-CM | POA: Insufficient documentation

## 2012-03-26 DIAGNOSIS — N803 Endometriosis of pelvic peritoneum, unspecified: Secondary | ICD-10-CM | POA: Insufficient documentation

## 2012-03-26 DIAGNOSIS — N840 Polyp of corpus uteri: Secondary | ICD-10-CM | POA: Insufficient documentation

## 2012-03-26 HISTORY — DX: Gastro-esophageal reflux disease without esophagitis: K21.9

## 2012-03-26 HISTORY — PX: LAPAROSCOPY: SHX197

## 2012-03-26 LAB — CBC
Hemoglobin: 13.5 g/dL (ref 12.0–15.0)
MCH: 30.9 pg (ref 26.0–34.0)
RBC: 4.37 MIL/uL (ref 3.87–5.11)
WBC: 9.1 10*3/uL (ref 4.0–10.5)

## 2012-03-26 LAB — HCG, SERUM, QUALITATIVE: Preg, Serum: NEGATIVE

## 2012-03-26 LAB — SURGICAL PCR SCREEN: Staphylococcus aureus: NEGATIVE

## 2012-03-26 SURGERY — DILATATION & CURETTAGE/HYSTEROSCOPY WITH VERSAPOINT RESECTION
Anesthesia: General | Site: Uterus | Wound class: Clean Contaminated

## 2012-03-26 MED ORDER — ROCURONIUM BROMIDE 50 MG/5ML IV SOLN
INTRAVENOUS | Status: AC
  Filled 2012-03-26: qty 1

## 2012-03-26 MED ORDER — DEXAMETHASONE SODIUM PHOSPHATE 10 MG/ML IJ SOLN
INTRAMUSCULAR | Status: DC | PRN
  Administered 2012-03-26: 10 mg via INTRAVENOUS

## 2012-03-26 MED ORDER — DEXAMETHASONE SODIUM PHOSPHATE 10 MG/ML IJ SOLN
INTRAMUSCULAR | Status: AC
  Filled 2012-03-26: qty 1

## 2012-03-26 MED ORDER — MUPIROCIN 2 % EX OINT
TOPICAL_OINTMENT | CUTANEOUS | Status: AC
  Administered 2012-03-26: 1 via NASAL
  Filled 2012-03-26: qty 22

## 2012-03-26 MED ORDER — ONDANSETRON HCL 4 MG/2ML IJ SOLN
INTRAMUSCULAR | Status: AC
  Filled 2012-03-26: qty 2

## 2012-03-26 MED ORDER — MIDAZOLAM HCL 5 MG/5ML IJ SOLN
INTRAMUSCULAR | Status: DC | PRN
  Administered 2012-03-26: 2 mg via INTRAVENOUS

## 2012-03-26 MED ORDER — FENTANYL CITRATE 0.05 MG/ML IJ SOLN
INTRAMUSCULAR | Status: DC | PRN
  Administered 2012-03-26: 25 ug via INTRAVENOUS
  Administered 2012-03-26: 150 ug via INTRAVENOUS
  Administered 2012-03-26: 50 ug via INTRAVENOUS
  Administered 2012-03-26: 25 ug via INTRAVENOUS
  Administered 2012-03-26: 100 ug via INTRAVENOUS

## 2012-03-26 MED ORDER — ROCURONIUM BROMIDE 100 MG/10ML IV SOLN
INTRAVENOUS | Status: DC | PRN
  Administered 2012-03-26: 40 mg via INTRAVENOUS

## 2012-03-26 MED ORDER — LACTATED RINGERS IV SOLN
INTRAVENOUS | Status: DC
  Administered 2012-03-26: 12:00:00 via INTRAVENOUS
  Administered 2012-03-26: 125 mL/h via INTRAVENOUS

## 2012-03-26 MED ORDER — ONDANSETRON HCL 4 MG/2ML IJ SOLN
INTRAMUSCULAR | Status: DC | PRN
  Administered 2012-03-26: 4 mg via INTRAVENOUS

## 2012-03-26 MED ORDER — CEFAZOLIN SODIUM 1-5 GM-% IV SOLN
INTRAVENOUS | Status: AC
  Filled 2012-03-26: qty 50

## 2012-03-26 MED ORDER — LIDOCAINE HCL (CARDIAC) 20 MG/ML IV SOLN
INTRAVENOUS | Status: AC
  Filled 2012-03-26: qty 5

## 2012-03-26 MED ORDER — PANTOPRAZOLE SODIUM 40 MG PO TBEC
DELAYED_RELEASE_TABLET | ORAL | Status: AC
  Administered 2012-03-26: 40 mg via ORAL
  Filled 2012-03-26: qty 1

## 2012-03-26 MED ORDER — CEFAZOLIN SODIUM 1-5 GM-% IV SOLN
1.0000 g | INTRAVENOUS | Status: AC
  Administered 2012-03-26: 1 g via INTRAVENOUS

## 2012-03-26 MED ORDER — NEOSTIGMINE METHYLSULFATE 1 MG/ML IJ SOLN
INTRAMUSCULAR | Status: AC
  Filled 2012-03-26: qty 10

## 2012-03-26 MED ORDER — MUPIROCIN 2 % EX OINT
TOPICAL_OINTMENT | Freq: Two times a day (BID) | CUTANEOUS | Status: DC
  Administered 2012-03-26: 1 via NASAL

## 2012-03-26 MED ORDER — FENTANYL CITRATE 0.05 MG/ML IJ SOLN
25.0000 ug | INTRAMUSCULAR | Status: DC | PRN
  Administered 2012-03-26 (×3): 50 ug via INTRAVENOUS

## 2012-03-26 MED ORDER — PROPOFOL 10 MG/ML IV EMUL
INTRAVENOUS | Status: DC | PRN
  Administered 2012-03-26: 200 mg via INTRAVENOUS

## 2012-03-26 MED ORDER — BUPIVACAINE HCL (PF) 0.25 % IJ SOLN
INTRAMUSCULAR | Status: DC | PRN
  Administered 2012-03-26: 10 mL

## 2012-03-26 MED ORDER — MIDAZOLAM HCL 2 MG/2ML IJ SOLN
INTRAMUSCULAR | Status: AC
  Filled 2012-03-26: qty 2

## 2012-03-26 MED ORDER — OXYCODONE-ACETAMINOPHEN 5-325 MG PO TABS
1.0000 | ORAL_TABLET | ORAL | Status: DC | PRN
  Administered 2012-03-26: 1 via ORAL

## 2012-03-26 MED ORDER — GLYCOPYRROLATE 0.2 MG/ML IJ SOLN
INTRAMUSCULAR | Status: DC | PRN
  Administered 2012-03-26: .15 mg via INTRAVENOUS
  Administered 2012-03-26: 0.6 mg via INTRAVENOUS

## 2012-03-26 MED ORDER — BUPIVACAINE-EPINEPHRINE PF 0.25-1:200000 % IJ SOLN
INTRAMUSCULAR | Status: AC
  Filled 2012-03-26: qty 30

## 2012-03-26 MED ORDER — FENTANYL CITRATE 0.05 MG/ML IJ SOLN
INTRAMUSCULAR | Status: AC
  Filled 2012-03-26: qty 2

## 2012-03-26 MED ORDER — SODIUM CHLORIDE 0.9 % IR SOLN
Status: DC | PRN
  Administered 2012-03-26: 3000 mL

## 2012-03-26 MED ORDER — NEOSTIGMINE METHYLSULFATE 1 MG/ML IJ SOLN
INTRAMUSCULAR | Status: DC | PRN
  Administered 2012-03-26: 4 mg via INTRAVENOUS

## 2012-03-26 MED ORDER — SODIUM CHLORIDE 0.9 % IJ SOLN
INTRAMUSCULAR | Status: DC | PRN
  Administered 2012-03-26: 5 mL via INTRAVENOUS

## 2012-03-26 MED ORDER — KETOROLAC TROMETHAMINE 30 MG/ML IJ SOLN
INTRAMUSCULAR | Status: DC | PRN
  Administered 2012-03-26: 30 mg via INTRAVENOUS

## 2012-03-26 MED ORDER — OXYCODONE-ACETAMINOPHEN 5-325 MG PO TABS
ORAL_TABLET | ORAL | Status: AC
  Administered 2012-03-26: 1 via ORAL
  Filled 2012-03-26: qty 1

## 2012-03-26 MED ORDER — FENTANYL CITRATE 0.05 MG/ML IJ SOLN
INTRAMUSCULAR | Status: AC
  Administered 2012-03-26: 50 ug via INTRAVENOUS
  Filled 2012-03-26: qty 2

## 2012-03-26 MED ORDER — GLYCOPYRROLATE 0.2 MG/ML IJ SOLN
INTRAMUSCULAR | Status: AC
  Filled 2012-03-26: qty 2

## 2012-03-26 MED ORDER — OXYCODONE-ACETAMINOPHEN 5-325 MG PO TABS: 1.0000 | ORAL_TABLET | ORAL | Status: AC | PRN

## 2012-03-26 MED ORDER — INDIGOTINDISULFONATE SODIUM 8 MG/ML IJ SOLN
INTRAMUSCULAR | Status: AC
Start: 2012-03-26 — End: 2012-03-26
  Filled 2012-03-26: qty 5

## 2012-03-26 MED ORDER — PANTOPRAZOLE SODIUM 40 MG PO TBEC
40.0000 mg | DELAYED_RELEASE_TABLET | Freq: Once | ORAL | Status: AC
  Administered 2012-03-26: 40 mg via ORAL

## 2012-03-26 MED ORDER — LIDOCAINE HCL (CARDIAC) 20 MG/ML IV SOLN
INTRAVENOUS | Status: DC | PRN
  Administered 2012-03-26: 60 mg via INTRAVENOUS

## 2012-03-26 MED ORDER — KETOROLAC TROMETHAMINE 30 MG/ML IJ SOLN
INTRAMUSCULAR | Status: AC
  Filled 2012-03-26: qty 1

## 2012-03-26 MED ORDER — PROPOFOL 10 MG/ML IV EMUL
INTRAVENOUS | Status: AC
  Filled 2012-03-26: qty 20

## 2012-03-26 MED ORDER — FENTANYL CITRATE 0.05 MG/ML IJ SOLN
INTRAMUSCULAR | Status: AC
  Filled 2012-03-26: qty 5

## 2012-03-26 MED ORDER — SCOPOLAMINE 1 MG/3DAYS TD PT72
MEDICATED_PATCH | TRANSDERMAL | Status: AC
  Administered 2012-03-26: 1 via TRANSDERMAL
  Filled 2012-03-26: qty 1

## 2012-03-26 SURGICAL SUPPLY — 34 items
ADH SKN CLS APL DERMABOND .7 (GAUZE/BANDAGES/DRESSINGS) ×1
BAG SPEC RTRVL LRG 6X4 10 (ENDOMECHANICALS)
CABLE HIGH FREQUENCY MONO STRZ (ELECTRODE) IMPLANT
CANISTER SUCTION 2500CC (MISCELLANEOUS) ×2 IMPLANT
CATH ROBINSON RED A/P 16FR (CATHETERS) ×2 IMPLANT
CLOTH BEACON ORANGE TIMEOUT ST (SAFETY) ×2 IMPLANT
CONTAINER PREFILL 10% NBF 60ML (FORM) ×4 IMPLANT
DERMABOND ADVANCED (GAUZE/BANDAGES/DRESSINGS) ×1
DERMABOND ADVANCED .7 DNX12 (GAUZE/BANDAGES/DRESSINGS) ×1 IMPLANT
ELECTRODE RT ANGLE VERSAPOINT (CUTTING LOOP) ×2 IMPLANT
FORCEPS CUTTING 33CM 5MM (CUTTING FORCEPS) IMPLANT
FORCEPS CUTTING 45CM 5MM (CUTTING FORCEPS) IMPLANT
GLOVE BIO SURGEON STRL SZ7.5 (GLOVE) ×5 IMPLANT
GOWN PREVENTION PLUS LG XLONG (DISPOSABLE) ×4 IMPLANT
GOWN PREVENTION PLUS XLARGE (GOWN DISPOSABLE) ×2 IMPLANT
GOWN STRL REIN XL XLG (GOWN DISPOSABLE) ×2 IMPLANT
NDL INSUFFLATION 14GA 120MM (NEEDLE) ×1 IMPLANT
NEEDLE INSUFFLATION 14GA 120MM (NEEDLE) ×2 IMPLANT
PACK HYSTEROSCOPY LF (CUSTOM PROCEDURE TRAY) ×2 IMPLANT
PACK LAPAROSCOPY BASIN (CUSTOM PROCEDURE TRAY) ×2 IMPLANT
POUCH SPECIMEN RETRIEVAL 10MM (ENDOMECHANICALS) IMPLANT
PROTECTOR NERVE ULNAR (MISCELLANEOUS) ×2 IMPLANT
SET IRRIG TUBING LAPAROSCOPIC (IRRIGATION / IRRIGATOR) IMPLANT
SLEEVE ADV FIXATION 5X100MM (TROCAR) IMPLANT
SOLUTION ELECTROLUBE (MISCELLANEOUS) ×2 IMPLANT
SUT VICRYL 0 UR6 27IN ABS (SUTURE) ×2 IMPLANT
SUT VICRYL 4-0 PS2 18IN ABS (SUTURE) ×3 IMPLANT
SYR TB 1ML 25GX5/8 (SYRINGE) ×2 IMPLANT
TOWEL OR 17X24 6PK STRL BLUE (TOWEL DISPOSABLE) ×4 IMPLANT
TRAY FOLEY CATH 14FR (SET/KITS/TRAYS/PACK) ×1 IMPLANT
TROCAR Z-THREAD BLADED 11X100M (TROCAR) ×2 IMPLANT
TROCAR Z-THREAD BLADED 5X100MM (TROCAR) IMPLANT
WARMER LAPAROSCOPE (MISCELLANEOUS) ×2 IMPLANT
WATER STERILE IRR 1000ML POUR (IV SOLUTION) ×2 IMPLANT

## 2012-03-26 NOTE — Op Note (Signed)
03/26/2012  12:28 PM  PATIENT:  Patricia Newton  41 y.o. female  PRE-OPERATIVE DIAGNOSIS:  Endometrial Polyp, Pelvic Pain  POST-OPERATIVE DIAGNOSIS:  Endometrial Polyp, Pelvic Pain  PROCEDURE:  Procedure(s): DILATATION & CURETTAGE/HYSTEROSCOPY WITH VERSAPOINT RESECTION LAPAROSCOPY OPERATIVE ABLATION OF PELVIC ENDOMETRIOSIS  SURGEON:  Surgeon(s): Lenoard Aden, MD  ASSISTANTS: none   ANESTHESIA:   local and general  ESTIMATED BLOOD LOSS: * No blood loss amount entered *   DRAINS: none   LOCAL MEDICATIONS USED:  MARCAINE     SPECIMEN:  Source of Specimen:  ENDOMETRIAL CURETTINGS, ENDOMETRIAL POLYPS  DISPOSITION OF SPECIMEN:  PATHOLOGY  COUNTS:  YES  DICTATION #: [id number]  PLAN OF CARE: DC HOME  PATIENT DISPOSITION:  PACU - hemodynamically stable.

## 2012-03-26 NOTE — Anesthesia Preprocedure Evaluation (Addendum)
Anesthesia Evaluation  Patient identified by MRN, date of birth, ID band Patient awake    Reviewed: Allergy & Precautions, H&P , Patient's Chart, lab work & pertinent test results, reviewed documented beta blocker date and time   Airway Mallampati: II TM Distance: >3 FB Neck ROM: full    Dental No notable dental hx. (+) Chipped,    Pulmonary  breath sounds clear to auscultation  Pulmonary exam normal       Cardiovascular + Valvular Problems/Murmurs (not appreciable) Rhythm:regular Rate:Normal     Neuro/Psych    GI/Hepatic GERD-  Medicated,  Endo/Other    Renal/GU      Musculoskeletal   Abdominal   Peds  Hematology   Anesthesia Other Findings   Reproductive/Obstetrics                         Anesthesia Physical Anesthesia Plan  ASA: II  Anesthesia Plan: General   Post-op Pain Management:    Induction: Intravenous  Airway Management Planned: Oral ETT  Additional Equipment:   Intra-op Plan:   Post-operative Plan:   Informed Consent: I have reviewed the patients History and Physical, chart, labs and discussed the procedure including the risks, benefits and alternatives for the proposed anesthesia with the patient or authorized representative who has indicated his/her understanding and acceptance.   Dental Advisory Given and Dental advisory given  Plan Discussed with: CRNA and Surgeon  Anesthesia Plan Comments: (  Discussed  general anesthesia, including possible nausea, instrumentation of airway, sore throat,pulmonary aspiration, etc. I asked if the were any outstanding questions, or  concerns before we proceeded. )        Anesthesia Quick Evaluation

## 2012-03-26 NOTE — Anesthesia Postprocedure Evaluation (Signed)
Anesthesia Post Note  Patient: Patricia Newton  Procedure(s) Performed: Procedure(s) (LRB): DILATATION & CURETTAGE/HYSTEROSCOPY WITH VERSAPOINT RESECTION (N/A) LAPAROSCOPY OPERATIVE (N/A)  Anesthesia type: GA  Patient location: PACU  Post pain: Pain level controlled  Post assessment: Post-op Vital signs reviewed  Last Vitals:  Filed Vitals:   03/26/12 1345  BP:   Pulse:   Temp: 36.7 C  Resp:     Post vital signs: Reviewed  Level of consciousness: sedated  Complications: No apparent anesthesia complications

## 2012-03-26 NOTE — Transfer of Care (Signed)
Immediate Anesthesia Transfer of Care Note  Patient: Patricia Newton  Procedure(s) Performed: Procedure(s) (LRB): DILATATION & CURETTAGE/HYSTEROSCOPY WITH VERSAPOINT RESECTION (N/A) LAPAROSCOPY OPERATIVE (N/A)  Patient Location: PACU  Anesthesia Type: General  Level of Consciousness: oriented and sedated  Airway & Oxygen Therapy: Patient Spontanous Breathing and Patient connected to nasal cannula oxygen  Post-op Assessment: Report given to PACU RN and Post -op Vital signs reviewed and stable  Post vital signs: stable  Complications: No apparent anesthesia complications

## 2012-03-26 NOTE — Progress Notes (Signed)
Patient ID: Patricia Newton, female   DOB: May 20, 1971, 41 y.o.   MRN: [id number] Patient seen and examined. Consent witnessed and signed. No changes noted. Update completed.

## 2012-03-26 NOTE — Discharge Instructions (Signed)
DISCHARGE INSTRUCTIONS: Laparoscopy  **NO IBUPROFEN CONTAINING MEDICINES UNTIL AFTER 6:30 PM**  The following instructions have been prepared to help you care for yourself upon your return home today.  Wound care: Marland Kitchen Do not get the incision wet for the first 24 hours. The incision should be kept clean and dry. . The Band-Aids or dressings may be removed the day after surgery. . Should the incision become sore, red, and swollen after the first week, check with your doctor.  Personal hygiene: . Shower the day after your procedure.  Activity and limitations: . Do NOT drive or operate any equipment today. . Do NOT lift anything more than 15 pounds for 2-3 weeks after surgery. . Do NOT rest in bed all day. . Walking is encouraged. Walk each day, starting slowly with 5-minute walks 3 or 4 times a day. Slowly increase the length of your walks. . Walk up and down stairs slowly. . Do NOT do strenuous activities, such as golfing, playing tennis, bowling, running, biking, weight lifting, gardening, mowing, or vacuuming for 2-4 weeks. Ask your doctor when it is okay to start.  Diet: Eat a light meal as desired this evening. You may resume your usual diet tomorrow.  Return to work: This is dependent on the type of work you do. For the most part you can return to a desk job within a week of surgery. If you are more active at work, please discuss this with your doctor.  What to expect after your surgery: You may have a slight burning sensation when you urinate on the first day. You may have a very small amount of blood in the urine. Expect to have a small amount of vaginal discharge/light bleeding for 1-2 weeks. It is not unusual to have abdominal soreness and bruising for up to 2 weeks. You may be tired and need more rest for about 1 week. You may experience shoulder pain for 24-72 hours. Lying flat in bed may relieve it.  Call your doctor for any of the following: . Develop a fever of 100.4 or  greater . Inability to urinate 6 hours after discharge from hospital . Severe pain not relieved by pain medications . Persistent of heavy bleeding at incision site . Redness or swelling around incision site after a week . Increasing nausea or vomiting  Patient Signature________________________________________ Nurse Signature_________________________________________ DISCHARGE INSTRUCTIONS: Laparoscopy  The following instructions have been prepared to help you care for yourself upon your return home today.  Wound care: Marland Kitchen Do not get the incision wet for the first 24 hours. The incision should be kept clean and dry. . The Band-Aids or dressings may be removed the day after surgery. . Should the incision become sore, red, and swollen after the first week, check with your doctor.  Personal hygiene: . Shower the day after your procedure.  Activity and limitations: . Do NOT drive or operate any equipment today. . Do NOT lift anything more than 15 pounds for 2-3 weeks after surgery. . Do NOT rest in bed all day. . Walking is encouraged. Walk each day, starting slowly with 5-minute walks 3 or 4 times a day. Slowly increase the length of your walks. . Walk up and down stairs slowly. . Do NOT do strenuous activities, such as golfing, playing tennis, bowling, running, biking, weight lifting, gardening, mowing, or vacuuming for 2-4 weeks. Ask your doctor when it is okay to start.  Diet: Eat a light meal as desired this evening. You may resume your usual  diet tomorrow.  Return to work: This is dependent on the type of work you do. For the most part you can return to a desk job within a week of surgery. If you are more active at work, please discuss this with your doctor.  What to expect after your surgery: You may have a slight burning sensation when you urinate on the first day. You may have a very small amount of blood in the urine. Expect to have a small amount of vaginal discharge/light bleeding  for 1-2 weeks. It is not unusual to have abdominal soreness and bruising for up to 2 weeks. You may be tired and need more rest for about 1 week. You may experience shoulder pain for 24-72 hours. Lying flat in bed may relieve it.  Call your doctor for any of the following: . Develop a fever of 100.4 or greater . Inability to urinate 6 hours after discharge from hospital . Severe pain not relieved by pain medications . Persistent of heavy bleeding at incision site . Redness or swelling around incision site after a week . Increasing nausea or vomiting  Patient Signature________________________________________ Nurse Signature_________________________________________

## 2012-03-26 NOTE — H&P (Signed)
NAME:  Patricia Newton, HARTH NO.:  [id number]  MEDICAL RECORD NO.:  [id number]  LOCATION:  [facility]                         FACILITY:  [facility]  PHYSICIAN:  Lenoard Aden, M.D.DATE OF BIRTH:  December 11, 1971  DATE OF ADMISSION:  03/24/2012 DATE OF DISCHARGE:                             HISTORY & PHYSICAL   CHIEF COMPLAINT:  Dysfunctional uterine bleeding and pelvic pain.  She is a 41 year old white female, G3, P2, with a history of dysfunctional uterine bleeding, structural lesion on saline sonohysterogram and history of endometriosis for surgical therapy.  ALLERGIES:  To ERYTHROMYCIN and VANCOMYCIN.  MEDICATIONS:  Include Ultram as needed, Zoloft, ibuprofen as needed and trazodone.  FAMILY HISTORY:  Heart disease, diabetes, breast cancer, history of vaginal delivery x2, miscarriage x1, history of appendectomy, and reportedly history of laparoscopy with reported endometriosis.  PHYSICAL EXAMINATION:  GENERAL: She is a well-developed, well-nourished, white female, in no acute distress. HEENT:  Normal. NECK:  Supple.  Full range of motion. LUNGS:  Clear. ABDOMEN:  Soft and nontender. PELVIC:  Normal size uterus.  Tenderness in the midline.  No adnexal masses.  IMPRESSION: 1. Dysfunctional uterine bleeding with structural lesion. 2. Questionable recurrent pelvic endometriosis.  PLAN:  Proceed with diagnostic laparoscopy, __________ diagnostic hysteroscopy, with Versapoint resection of possible endometrial poly with D and C risks of anesthesia, infection, bleeding, injury to abdominal organs, need for repair was discussed, delayed versus immediate complications to include bowel and bladder injury noted.  The patient acknowledges and wishes to proceed.     Lenoard Aden, M.D.     RJT/MEDQ  D:  03/25/2012  T:  03/26/2012  Job:  [id number]

## 2012-03-27 MED FILL — Heparin Sodium (Porcine) Inj 5000 Unit/ML: INTRAMUSCULAR | Qty: 1 | Status: AC

## 2012-03-27 NOTE — Op Note (Signed)
NAME:  NEIL, ERRICKSON NO.:  [id number]  MEDICAL RECORD NO.:  [id number]  LOCATION:  [facility]                          FACILITY:  [facility]  PHYSICIAN:  Lenoard Aden, M.D.DATE OF BIRTH:  04/07/1971  DATE OF PROCEDURE:  03/26/2012 DATE OF DISCHARGE:  03/26/2012                              OPERATIVE REPORT   DESCRIPTION OF PROCEDURE:  [facility].  After being apprised of risks of anesthesia, infection, bleeding, injury to abdominal organs, need for repair, delayed versus immediate complications include bowel and bladder injury, possible need for repair, and inability to cure pelvic pain, the patient's consents were signed and she was brought to the operating room without issue.  She is administered general anesthetic, prepped and draped in usual sterile fashion, catheterized until the bladder was empty.  Exam under anesthesia reveals a mid positioned uterus and no adnexal masses.  The Hulka tenaculum was placed per vagina.  An infraumbilical incision was made with a scalpel.  Veress needle placed opening pressure -2.  A 3-1/2 L of CO2 insufflated without difficulty.  Trocar placed atraumatically.  Pictures taken.  Normal liver, gallbladder area, there are adhesions of the right cecal area to the right abdominal side wall.  In addition, there are some old- appearing adhesions of the sigmoid mesentery to the pelvic brim.  There was evidence of pelvic endometriosis and master's window in the cul-de- sac.  Anterior cul-de-sac was clear.  Ovaries and tubes appear normal. A 5 mm trocar site was made in the left lower quadrant.  Under direct visualization, trocar was placed.  The Kleppinger bipolar cautery was used to elevate, invert and cauterize the Masters window in the cul-de- sac as well as some superficial hemorrhagic ablation of endometriosis, which were cauterized without difficulty.  Good hemostasis was noted. Both ureters peristalsing normally.  CO2 was  released.  All instruments removed from the abdomen.  Incisions were closed with a 0 Vicryl and 4-0 Vicryl.  Dermabond placed.  Attention then turned to the vaginal portion of procedure whereby a speculum was placed.  A tenaculum was placed. Cervix was easily dilated up to a #31 Pratt dilator.  Hysteroscope placed.  There was a large endometrial polyp along the anterior wall approaching the fundus and 2 smaller polyps on the lateral side walls and these were all resected in total using the VersaPoint right-angle loop in multiple resection passes.  Good hemostasis was noted.  D and C, performed using sharp curettage in a 4 quadrant method.  Revisualization reveals a cavity to be empty of any structural lesions.  No evidence of uterine perforation and bilateral normal tubal ostia.  Fluid deficit of 150 mL as noted.  The instruments were removed.  Dilute Marcaine solution has been placed 20 mL total for a paracervical block.  The patient tolerated the procedure well, was awakened, and transferred to recovery in good condition.     Lenoard Aden, M.D.     RJT/MEDQ  D:  03/26/2012  T:  03/27/2012  Job:  [id number]

## 2012-03-30 ENCOUNTER — Encounter (HOSPITAL_COMMUNITY): Payer: Self-pay | Admitting: Obstetrics and Gynecology

## 2012-05-08 ENCOUNTER — Other Ambulatory Visit: Payer: Self-pay | Admitting: Obstetrics and Gynecology

## 2012-05-08 DIAGNOSIS — Z1231 Encounter for screening mammogram for malignant neoplasm of breast: Secondary | ICD-10-CM

## 2012-05-29 ENCOUNTER — Ambulatory Visit: Payer: Managed Care, Other (non HMO)

## 2012-09-10 ENCOUNTER — Ambulatory Visit: Payer: Managed Care, Other (non HMO)

## 2012-09-10 ENCOUNTER — Telehealth: Payer: Self-pay

## 2012-09-10 ENCOUNTER — Ambulatory Visit (INDEPENDENT_AMBULATORY_CARE_PROVIDER_SITE_OTHER): Payer: Managed Care, Other (non HMO) | Admitting: Family Medicine

## 2012-09-10 VITALS — BP 122/76 | HR 118 | Temp 99.0°F | Resp 17 | Ht 69.0 in | Wt 186.0 lb

## 2012-09-10 DIAGNOSIS — R05 Cough: Secondary | ICD-10-CM

## 2012-09-10 DIAGNOSIS — R059 Cough, unspecified: Secondary | ICD-10-CM

## 2012-09-10 DIAGNOSIS — Z720 Tobacco use: Secondary | ICD-10-CM

## 2012-09-10 DIAGNOSIS — F17200 Nicotine dependence, unspecified, uncomplicated: Secondary | ICD-10-CM

## 2012-09-10 DIAGNOSIS — J209 Acute bronchitis, unspecified: Secondary | ICD-10-CM

## 2012-09-10 DIAGNOSIS — B379 Candidiasis, unspecified: Secondary | ICD-10-CM

## 2012-09-10 DIAGNOSIS — J0100 Acute maxillary sinusitis, unspecified: Secondary | ICD-10-CM

## 2012-09-10 MED ORDER — AZITHROMYCIN 250 MG PO TABS: ORAL_TABLET | ORAL | Status: DC

## 2012-09-10 MED ORDER — AZITHROMYCIN 250 MG PO TABS
ORAL_TABLET | ORAL | Status: DC
Start: 2012-09-10 — End: 2012-09-10

## 2012-09-10 MED ORDER — FLUCONAZOLE 150 MG PO TABS: 150.0000 mg | ORAL_TABLET | Freq: Once | ORAL | Status: DC

## 2012-09-10 MED ORDER — FLUTICASONE PROPIONATE 50 MCG/ACT NA SUSP
NASAL | Status: DC
Start: 2012-09-10 — End: 2014-09-15

## 2012-09-10 NOTE — Patient Instructions (Addendum)
1. Cough  DG Chest 2 View  2. Sinusitis, acute maxillary    3. Acute bronchitis    4. Tobacco abuse       START MUCINEX DM ONE TABLET TWICE DAILY FOR COUGH, CONGESTION

## 2012-09-10 NOTE — Progress Notes (Signed)
[address]   [place], [place]  [address]   [phone]  Subjective:    Patient ID: Patricia Newton, female    DOB: 31-Oct-1971, 41 y.o.   MRN: [id number]  HPIThis 41 y.o. female presents for evaluation of cough.   Onset two weeks ago.  Onset with nasal congestion, ST.  Children have been sick.  After a few days, improvement.  Then, one week ago, onset of coughing.  Has been sick for one week.  Had left over Bactrim DS from wound infection in foot; started taking Bactrim DS bid with improvement.  Sputum production with less brown color to yellow.  Developed severe diarrhea after four days of Bactrim so stopped it.  Still suffering with nasal congestion and hard cough.  Hard to get up sputum; when gets something up from chest, really thick yellow sputum.  No fever/chills/sweats.  +HA.  +sinus pressure.  Horrible HA at night while in bed.  No ear pain now but did in beginning; scratchy throat.  No rhinorrhea; +nasal congestion clear or white or yellow and thick.  +PND  +Sneezing.  No SOB; +fatigue/malaise.  No n/v; diarrhea gone.  NO rash.  Nyquil qhs.  Unable to take pseudoephedrine; Advil.   +tobacco abuse.  Also having yeast infection since taking Bactrim.  PMH:  Endometriosis Psurg: 1. Endometrial ablation x 3-4 with polyp resection.  2. Appendectomy 3.  Knee surgery. All:  Vancomycin Medications:  Trazodone PRN Social:  Homemaker; 2 children; living with 9 total people.   Review of Systems  Constitutional: Positive for fatigue. Negative for fever and chills.  HENT: Positive for congestion, sneezing, postnasal drip and sinus pressure. Negative for ear pain, sore throat, rhinorrhea and mouth sores.   Respiratory: Positive for cough and shortness of breath. Negative for wheezing and stridor.   Cardiovascular: Negative for chest pain.  Gastrointestinal: Positive for diarrhea. Negative for nausea, vomiting and abdominal pain.  Skin: Negative for rash.        Past Medical History    Diagnosis Date  . PONV (postoperative nausea and vomiting)   . Arthritis   . No pertinent past medical history   . GERD (gastroesophageal reflux disease)     tums prn    Past Surgical History  Procedure Date  . Diagnostic laparoscopy     x3 for endometriosis  . Appendectomy   . Shoulder arthroscopy right  . Knee arthroscopy     right-x3  . Tendon exploration 03/11/2012    Procedure: TENDON EXPLORATION;  Surgeon: Nicki Reaper, MD;  Location: [facility];  Service: Orthopedics;  Laterality: Left;  EXPLORATION OF FLEXOR TENDON LEFT INDEX FINGER     . Left hand surgery 03/11/12  . Laparoscopy 03/26/2012    Procedure: LAPAROSCOPY OPERATIVE;  Surgeon: Lenoard Aden, MD;  Location: [facility];  Service: Gynecology;  Laterality: N/A;  with ablation of endometriosis     Prior to Admission medications   Medication Sig Start Date End Date Taking? Authorizing Provider  trazodone (DESYREL) 300 MG tablet Take 300 mg by mouth at bedtime.   Yes Historical Provider, MD  nystatin ointment (MYCOSTATIN) Apply 1 application topically 2 (two) times daily. For yeast infection.    Historical Provider, MD  ondansetron (ZOFRAN-ODT) 4 MG disintegrating tablet Take 4 mg by mouth every 8 (eight) hours as needed.    Historical Provider, MD  traMADol (ULTRAM) 50 MG tablet Take 50 mg by mouth every 6 (six) hours as needed.  Historical Provider, MD    Allergies  Allergen Reactions  . Erythromycin Diarrhea  . Vancomycin Cross Reactors Hives    History   Social History  . Marital Status: Married    Spouse Name: N/A    Number of Children: N/A  . Years of Education: N/A   Occupational History  . Not on file.   Social History Main Topics  . Smoking status: Current Every Day Smoker -- 0.5 packs/day for 15 years    Types: Cigarettes  . Smokeless tobacco: Never Used  . Alcohol Use: Yes     rare  . Drug Use: No  . Sexually Active: Yes    Birth Control/ Protection: None   Other Topics  Concern  . Not on file   Social History Narrative  . No narrative on file    No family history on file.  Objective:   Physical Exam  Nursing note and vitals reviewed. Constitutional: Patricia Newton is oriented to person, place, and time. Patricia Newton appears well-developed and well-nourished. No distress.  HENT:  Head: Normocephalic and atraumatic.  Right Ear: External ear normal.  Left Ear: External ear normal.  Nose: Nose normal.  Mouth/Throat: Oropharynx is clear and moist. No oropharyngeal exudate.  Eyes: Conjunctivae normal are normal. Pupils are equal, round, and reactive to light.  Neck: Normal range of motion. Neck supple. No thyromegaly present.  Cardiovascular: Regular rhythm and intact distal pulses.  Tachycardia present.   Murmur heard. Pulmonary/Chest: Effort normal and breath sounds normal. No respiratory distress. Patricia Newton has no wheezes. Patricia Newton has no rales.  Lymphadenopathy:    Patricia Newton has no cervical adenopathy.  Neurological: Patricia Newton is alert and oriented to person, place, and time. No cranial nerve deficit. Patricia Newton exhibits normal muscle tone.  Skin: Skin is warm and dry. Patricia Newton is not diaphoretic.  Psychiatric: Patricia Newton has a normal mood and affect. Her behavior is normal. Judgment and thought content normal.      [facility] reading (PRIMARY) by  Dr. Katrinka Blazing.  CXR:  NAD.   Assessment & Plan:   1. Cough  DG Chest 2 View  2. Sinusitis, acute maxillary    3. Acute bronchitis    4. Tobacco abuse    5. Candidiasis  fluconazole (DIFLUCAN) 150 MG tablet    1.  Acute Bronchitis: New.  Rx for Zithromax provided; recommend Mucinex DM bid.  Supportive care with rest, fluids. 2.  Acute Sinusitis: New.  Rx for Zithromax provided.  Rx for Flonase also provided.   3.  Tobacco abuse: contemplative; considering cessation and has quit date; encouraged cessation. 4.  Vaginal Candidiasis:  New. Rx for Diflucan provided; if no improvement with rx, RTC for pelvic exam.

## 2012-09-10 NOTE — Telephone Encounter (Signed)
Pt was seen today by Dr Katrinka Blazing. Pt needs stronger med for cough. Please let pt know If this has been called in. [facility].  Pt phone [phone]

## 2012-09-11 ENCOUNTER — Other Ambulatory Visit: Payer: Self-pay | Admitting: Family Medicine

## 2012-09-11 DIAGNOSIS — R059 Cough, unspecified: Secondary | ICD-10-CM

## 2012-09-11 DIAGNOSIS — R05 Cough: Secondary | ICD-10-CM

## 2012-09-11 MED ORDER — HYDROCOD POLST-CHLORPHEN POLST 10-8 MG/5ML PO LQCR: 5.0000 mL | Freq: Two times a day (BID) | ORAL | Status: DC | PRN

## 2012-09-11 NOTE — Telephone Encounter (Signed)
Dr Katrinka Blazing do you want to send something stronger in for pt's cough? (pt has already called this morning to check on the status of this)

## 2012-09-11 NOTE — Telephone Encounter (Signed)
Pt has called several times and says she is miserable and needs a rx for cough medicine in. She is wanting this done as soon as possible.

## 2012-09-11 NOTE — Telephone Encounter (Signed)
Please return call.  Rx for Tussionex sent to [facility] at 19:13.  KMS

## 2012-09-11 NOTE — Telephone Encounter (Signed)
Called pt and advised that rx for Tussionex faxed to pharmacy [facility]. KMS

## 2012-09-14 NOTE — Progress Notes (Signed)
Reviewed and agree.

## 2012-11-10 ENCOUNTER — Telehealth: Payer: Self-pay | Admitting: Radiology

## 2012-11-10 NOTE — Telephone Encounter (Signed)
I have gotten a call from [facility] to verify a Rx written on 09/11/12 I advised we did send in a Rx for Tussionex, on this day. However [facility] [address]. [facility] indicates they have suspicions patient is calling in her own medications, pharmacist will call back to advise further. FYI

## 2013-02-10 ENCOUNTER — Ambulatory Visit (INDEPENDENT_AMBULATORY_CARE_PROVIDER_SITE_OTHER): Payer: Managed Care, Other (non HMO) | Admitting: Family Medicine

## 2013-02-10 ENCOUNTER — Other Ambulatory Visit: Payer: Self-pay | Admitting: *Deleted

## 2013-02-10 VITALS — BP 150/88 | HR 109 | Temp 99.4°F | Resp 16 | Ht 69.0 in | Wt 184.0 lb

## 2013-02-10 DIAGNOSIS — M26609 Unspecified temporomandibular joint disorder, unspecified side: Secondary | ICD-10-CM | POA: Insufficient documentation

## 2013-02-10 DIAGNOSIS — S0340XA Sprain of jaw, unspecified side, initial encounter: Secondary | ICD-10-CM

## 2013-02-10 MED ORDER — DICLOFENAC SODIUM 1 % TD GEL: 2.0000 g | Freq: Two times a day (BID) | TRANSDERMAL | Status: DC

## 2013-02-10 MED ORDER — TRAMADOL HCL 50 MG PO TABS: 50.0000 mg | ORAL_TABLET | Freq: Every evening | ORAL | Status: DC | PRN

## 2013-02-10 NOTE — Assessment & Plan Note (Signed)
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 Patient likely had a possible subluxation when she had that audible click and potentially capsular injury. Patient given to parent child to try topically. Warned of potential side effects Patient also given tramadol to help her with sleep. Patient given exercises and handout about at home and allergies. Patient does have an ENT physician and will followup with them. Patient can follow up here and more importantly with her primary care physician as needed.

## 2013-02-10 NOTE — Progress Notes (Signed)
Chief complaint: Left jaw pain  History of present illness: Patient is a 42 year old female who is coming in with a two-week history of jaw pain. Patient states that it started when she was chewing and she had an audible pop but was very loud. Patient did not have pain immediately but then hours afterward started having discomfort on this left lateral side of her jaw. Patient states over the course of multiple days started becoming very tender to palpation of the leg she was having some swelling. Patient does have a past medical history significant for sinusitis and felt that this could be related to her sinuses but was significantly more tender. Patient denies any fevers or chills or any hearing problems. Patient states as long as she talks carefully she has no pain but any chewing motion seems to be significantly and uncomfortable. Patient has been eating much more liquid foods and has even lost weight secondary to this problem. Patient states that over the course of the last 48 hours and has even woken her up at night and that is when she decided to come in. Patient describes the pain as a dull ache with sharp sensation from time to time. Patient has been trying icing and over-the-counter anti-inflammatories with minimal improvement. Patient denies any significant headache, visual changes, or as stated above any fevers chills or hearing problems.  Past medical history, social, surgical and family history all reviewed.   Review of systems as stated above in history of present illness  Physical exam Blood pressure 150/88, pulse 109, temperature 99.4 F (37.4 C), temperature source Oral, resp. rate 16, height 5\' 9"  (1.753 m), weight 184 lb (83.462 kg), last menstrual period 01/11/2013, SpO2 99.00%. General: No significant distress. Alert and oriented x3 mood normal the patient does have some mild pressure speech. Respiratory: Clear to auscultation bilaterally HEENT: Pupils are equal round react to light  and accommodation, extraocular movements intact, tympanic membranes are visualized bilaterally with no retraction or bulging. There is no signs of infection. Patient is unable to open mouth fully without pain. Posterior pharynx is seen with some mild erythema but no significant postnasal drip or any swelling. Uvula is midline. Patient is very tender to palpation even to light touch over the left TMJ. There is no rash on the skin and no swelling appreciated. Patient does not have any lymphadenopathy. With opening of the jaw patient does have some mild lateral deviation once again hard to quantify secondary to patient's pain limiting her participation in evaluation.

## 2013-02-10 NOTE — Patient Instructions (Signed)
Very nice to meet you. Please go see your ENT physician. 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 Continue to do the exercises I showed you. I'm giving you voltaren gel to try. Apply to the area twice daily I'm also giving you some tramadol to help you at night. Also do icing 20 minutes 4-5 times a day. I hope this helps.  Temporomandibular Problems  Temporomandibular joint (TMJ) dysfunction means there are problems with the joint between your jaw and your skull. This is a joint lined by cartilage like other joints in your body but also has a small disc in the joint which keeps the bones from rubbing on each other. These joints are like other joints and can get inflamed (sore) from arthritis and other problems. When this joint gets sore, it can cause headaches and pain in the jaw and the face. CAUSES  Usually the arthritic types of problems are caused by soreness in the joint. Soreness in the joint can also be caused by overuse. This may come from grinding your teeth. It may also come from mis-alignment in the joint. DIAGNOSIS Diagnosis of this condition can often be made by history and exam. Sometimes your caregiver may need X-rays or an MRI scan to determine the exact cause. It may be necessary to see your dentist to determine if your teeth and jaws are lined up correctly. TREATMENT  Most of the time this problem is not serious; however, sometimes it can persist (become chronic). When this happens medications that will cut down on inflammation (soreness) help. Sometimes a shot of cortisone into the joint will be helpful. If your teeth are not aligned it may help for your dentist to make a splint for your mouth that can help this problem. If no physical problems can be found, the problem may come from tension. If tension is found to be the cause, biofeedback or relaxation techniques may be helpful. HOME CARE INSTRUCTIONS   Later in the day, applications of ice packs may be helpful. Ice can be used in a plastic bag  with a towel around it to prevent frostbite to skin. This may be used about every 2 hours for 20 to 30 minutes, as needed while awake, or as directed by your caregiver.  Only take over-the-counter or prescription medicines for pain, discomfort, or fever as directed by your caregiver.  If physical therapy was prescribed, follow your caregiver's directions.  Wear mouth appliances as directed if they were given. Document Released: 09/03/2001 Document Revised: 03/02/2012 Document Reviewed: 12/11/2008 [facility] Patient Information 2013 [place], [place].

## 2013-05-24 ENCOUNTER — Encounter: Payer: Managed Care, Other (non HMO) | Admitting: Nurse Practitioner

## 2013-06-21 ENCOUNTER — Encounter: Payer: Managed Care, Other (non HMO) | Admitting: Obstetrics and Gynecology

## 2013-06-29 ENCOUNTER — Ambulatory Visit: Payer: Managed Care, Other (non HMO) | Admitting: Gynecology

## 2013-08-02 ENCOUNTER — Ambulatory Visit: Payer: Managed Care, Other (non HMO) | Admitting: Gynecology

## 2013-08-17 ENCOUNTER — Ambulatory Visit: Payer: Managed Care, Other (non HMO) | Admitting: Gynecology

## 2013-09-15 ENCOUNTER — Ambulatory Visit: Payer: Managed Care, Other (non HMO) | Admitting: Gynecology

## 2013-09-26 ENCOUNTER — Ambulatory Visit: Payer: Managed Care, Other (non HMO)

## 2013-09-26 ENCOUNTER — Ambulatory Visit (INDEPENDENT_AMBULATORY_CARE_PROVIDER_SITE_OTHER): Payer: Managed Care, Other (non HMO) | Admitting: Internal Medicine

## 2013-09-26 VITALS — BP 128/76 | HR 95 | Temp 99.7°F | Resp 16 | Ht 69.75 in | Wt 186.2 lb

## 2013-09-26 DIAGNOSIS — M25539 Pain in unspecified wrist: Secondary | ICD-10-CM

## 2013-09-26 DIAGNOSIS — S63501A Unspecified sprain of right wrist, initial encounter: Secondary | ICD-10-CM

## 2013-09-26 DIAGNOSIS — M25531 Pain in right wrist: Secondary | ICD-10-CM

## 2013-09-26 DIAGNOSIS — N809 Endometriosis, unspecified: Secondary | ICD-10-CM

## 2013-09-26 DIAGNOSIS — S63509A Unspecified sprain of unspecified wrist, initial encounter: Secondary | ICD-10-CM

## 2013-09-26 MED ORDER — OXYCODONE-ACETAMINOPHEN 5-325 MG PO TABS: 1.0000 | ORAL_TABLET | Freq: Every evening | ORAL | Status: DC | PRN

## 2013-09-26 NOTE — Progress Notes (Addendum)
This chart was scribed for Patricia Sia, MD by Joaquin Music, ED Scribe. This patient was seen in room Room 1 and the patient's care was started at 5:05 PM   Subjective:    Patient ID: Patricia Newton, female    DOB: 1971/11/30, 42 y.o.   MRN: [id number]  HPI Pt presents to [facility] with right wrist injury onset 3 days. Pt states she was playing with her friends dog and heard a "pop" in her wrist. Pt states the pain in the wrist has worsen since the incident. She has been having a difficult time driving. Pt state she had pain in the 5th digit down to her wrist. Pt states she has bruising and swelling present. Pt states at night the pain generally gets worse. No loss of hand function/finger movement.  Pt takes Tramadol since she has severe Endometriosis, but this is not enough at night  Review of Systems     Objective:   Physical Exam  Nursing note and vitals reviewed. Constitutional: She is oriented to person, place, and time. She appears well-developed and well-nourished. No distress.  HENT:  Head: Normocephalic and atraumatic.  Eyes: Pupils are equal, round, and reactive to light.  Neck: Normal range of motion.  Cardiovascular: Normal rate, regular rhythm and normal heart sounds.   Pulmonary/Chest: Effort normal. No respiratory distress.  Musculoskeletal:  R wrist with ecchymoses over the flexor retinaculum Tender in this area Pain with ulnar dev along 5th finger to mid forearm Rad dev = pain along thumb No swelling there but is swollen over volar carpals Adequate rom fingers w/ pain of resisted movement 4/5 fingers Flex/extens wrist =pain at full range FTO intact  Neurological: She is alert and oriented to person, place, and time.  Skin: Skin is warm and dry.  Psychiatric: She has a normal mood and affect. Her behavior is normal.    [facility] reading (PRIMARY) by  Dr. Josephina Gip fx/disloc of carpals      Assessment & Plan:  P#1 wrist sprain with  Ecchymoses  Brace rom with ice after bid Reck 1 week--to ck [facility] and reck for carpal dissociation/or can call if not well for orthopedic referral Meds ordered this encounter  Medications  . oxyCODONE-acetaminophen (ROXICET) 5-325 MG per tablet    Sig: Take 1 tablet by mouth at bedtime as needed for pain.    Dispense:  12 tablet    Refill:  0     I have reviewed and agree with documentation by scribe. Tadarius Maland P. Merla Riches, M.D.

## 2013-09-27 DIAGNOSIS — N809 Endometriosis, unspecified: Secondary | ICD-10-CM | POA: Insufficient documentation

## 2013-10-15 ENCOUNTER — Ambulatory Visit: Payer: Managed Care, Other (non HMO) | Admitting: Gynecology

## 2013-10-19 ENCOUNTER — Ambulatory Visit (INDEPENDENT_AMBULATORY_CARE_PROVIDER_SITE_OTHER): Payer: Managed Care, Other (non HMO) | Admitting: Internal Medicine

## 2013-10-19 VITALS — BP 124/72 | HR 100 | Temp 98.2°F | Resp 16 | Ht 68.0 in | Wt 182.0 lb

## 2013-10-19 DIAGNOSIS — S39012A Strain of muscle, fascia and tendon of lower back, initial encounter: Secondary | ICD-10-CM

## 2013-10-19 DIAGNOSIS — S339XXA Sprain of unspecified parts of lumbar spine and pelvis, initial encounter: Secondary | ICD-10-CM

## 2013-10-19 MED ORDER — OXYCODONE-ACETAMINOPHEN 5-325 MG PO TABS: 1.0000 | ORAL_TABLET | Freq: Every evening | ORAL | Status: DC | PRN

## 2013-10-19 MED ORDER — MELOXICAM 15 MG PO TABS: 15.0000 mg | ORAL_TABLET | Freq: Every day | ORAL | Status: DC

## 2013-10-19 MED ORDER — CYCLOBENZAPRINE HCL 10 MG PO TABS: 10.0000 mg | ORAL_TABLET | Freq: Every day | ORAL | Status: DC

## 2013-10-19 NOTE — Progress Notes (Signed)
This chart was scribed for Tonye Pearson, MD by Caryn Bee, Medical Scribe. This patient was seen in Room/bed 12 and the patient's care was started at 1:20 PM.  Subjective:    Patient ID: Patricia Newton, female    DOB: July 31, 1971, 42 y.o.   MRN: [id number]  HPI HPI Comments: Patricia Newton is a 42 y.o. female who presents to [facility] complaining of sudden onset moderate back pain onset 2 weeks. Pt states that she can sit down get into car with no pain, but pain is worsened when standing and walking. She also reports that when lying down the pain is improved when she isn't moving. She states that after onset she had severe pain when trying to stand back up from bending down. Pt denies weakness or numbness in her bilateral legs or feet, hip pain, urinary symptoms, constipation, diarrhea, cramps, menstrual pain, fatigue. She has taken ibuprofen and used a heated pad with mild relief.    Review of Systems  Constitutional: Negative for fatigue.  Gastrointestinal: Negative for diarrhea and constipation.  Genitourinary: Negative for dysuria, urgency, frequency, difficulty urinating and menstrual problem.  Musculoskeletal: Positive for back pain.  Neurological: Negative for weakness and numbness.   Past Medical History  Diagnosis Date  . PONV (postoperative nausea and vomiting)   . Arthritis   . No pertinent past medical history   . GERD (gastroesophageal reflux disease)     tums prn  . Allergy   . Asthma   . Heart murmur    History   Social History  . Marital Status: Married    Spouse Name: N/A    Number of Children: N/A  . Years of Education: N/A   Occupational History  . Not on file.   Social History Main Topics  . Smoking status: Current Every Day Smoker -- 0.50 packs/day for 15 years    Types: Cigarettes  . Smokeless tobacco: Never Used  . Alcohol Use: No     Comment: rare  . Drug Use: No  . Sexual Activity: Yes    Birth Control/ Protection: None   Other  Topics Concern  . Not on file   Social History Narrative  . No narrative on file   Past Surgical History  Procedure Laterality Date  . Diagnostic laparoscopy      x3 for endometriosis  . Appendectomy    . Shoulder arthroscopy  right  . Knee arthroscopy      right-x3  . Tendon exploration  03/11/2012    Procedure: TENDON EXPLORATION;  Surgeon: Nicki Reaper, MD;  Location: [facility];  Service: Orthopedics;  Laterality: Left;  EXPLORATION OF FLEXOR TENDON LEFT INDEX FINGER     . Left hand surgery  03/11/12  . Laparoscopy  03/26/2012    Procedure: LAPAROSCOPY OPERATIVE;  Surgeon: Lenoard Aden, MD;  Location: [facility];  Service: Gynecology;  Laterality: N/A;  with ablation of endometriosis    Family History  Problem Relation Age of Onset  . Heart disease Mother   . Diabetes Father   . Asthma Sister   . Heart disease Daughter    Allergies  Allergen Reactions  . Erythromycin Diarrhea  . Vancomycin Cross Reactors Hives        Objective:   Physical Exam  Nursing note and vitals reviewed. Constitutional: She is oriented to person, place, and time. She appears well-developed and well-nourished. She appears distressed.  Neck: Normal range of motion. Neck supple.  Cardiovascular: Normal rate.  Pulmonary/Chest: Effort normal and breath sounds normal.  No posterior rib tenderness  Musculoskeletal:  Straight leg raise is positive on the left at 90 degrees no radiation to the buttock Hip has a good range of motion No sensory loss - left leg  Neurological: She is alert and oriented to person, place, and time. She has normal reflexes. She exhibits normal muscle tone.  Psychiatric: She has a normal mood and affect. Her behavior is normal.      Assessment & Plan:  Lumbosacral strain  Meds ordered this encounter  Medications  . cyclobenzaprine (FLEXERIL) 10 MG tablet    Sig: Take 1 tablet (10 mg total) by mouth at bedtime.    Dispense:  30 tablet    Refill:  0   . meloxicam (MOBIC) 15 MG tablet    Sig: Take 1 tablet (15 mg total) by mouth daily.    Dispense:  30 tablet    Refill:  0  . oxyCODONE-acetaminophen (ROXICET) 5-325 MG per tablet    Sig: Take 1 tablet by mouth at bedtime as needed for pain.    Dispense:  12 tablet    Refill:  0   Referred to physical therapy Followup f4 weeks If not improving will progress to x-rays

## 2013-11-24 ENCOUNTER — Encounter: Payer: Managed Care, Other (non HMO) | Admitting: Internal Medicine

## 2014-01-06 ENCOUNTER — Other Ambulatory Visit: Payer: Self-pay

## 2014-01-06 MED ORDER — CYCLOBENZAPRINE HCL 10 MG PO TABS: 10.0000 mg | ORAL_TABLET | Freq: Every day | ORAL | Status: DC

## 2014-01-06 NOTE — Telephone Encounter (Signed)
I have refilled her Flexeril - I agree with the no refill of percocet without an OV due to it being given for an acute problem.

## 2014-01-06 NOTE — Telephone Encounter (Signed)
Pt would like a refill on oxycodone as well as flexeril for her back pain. Best# [phone]

## 2014-01-06 NOTE — Telephone Encounter (Signed)
Please review. I pended RF of flexeril for review, but didn't think we would be able to RF Oxy w/out recheck.

## 2014-01-07 NOTE — Telephone Encounter (Signed)
Notified pt of status on VM

## 2014-03-02 ENCOUNTER — Ambulatory Visit (INDEPENDENT_AMBULATORY_CARE_PROVIDER_SITE_OTHER): Payer: BC Managed Care – PPO | Admitting: Internal Medicine

## 2014-03-02 ENCOUNTER — Encounter: Payer: Self-pay | Admitting: Internal Medicine

## 2014-03-02 VITALS — BP 134/89 | HR 95 | Temp 99.0°F | Resp 16 | Ht 69.0 in | Wt 191.0 lb

## 2014-03-02 DIAGNOSIS — IMO0001 Reserved for inherently not codable concepts without codable children: Secondary | ICD-10-CM

## 2014-03-02 DIAGNOSIS — Z124 Encounter for screening for malignant neoplasm of cervix: Secondary | ICD-10-CM

## 2014-03-02 DIAGNOSIS — R5383 Other fatigue: Secondary | ICD-10-CM

## 2014-03-02 DIAGNOSIS — Z0000 Encounter for general adult medical examination without abnormal findings: Secondary | ICD-10-CM

## 2014-03-02 DIAGNOSIS — R5381 Other malaise: Secondary | ICD-10-CM

## 2014-03-02 DIAGNOSIS — Z1239 Encounter for other screening for malignant neoplasm of breast: Secondary | ICD-10-CM

## 2014-03-02 LAB — CBC WITH DIFFERENTIAL/PLATELET
BASOS PCT: 0 % (ref 0–1)
Basophils Absolute: 0 10*3/uL (ref 0.0–0.1)
EOS ABS: 0.1 10*3/uL (ref 0.0–0.7)
Eosinophils Relative: 1 % (ref 0–5)
HCT: 44.3 % (ref 36.0–46.0)
HEMOGLOBIN: 14.8 g/dL (ref 12.0–15.0)
LYMPHS ABS: 1.9 10*3/uL (ref 0.7–4.0)
Lymphocytes Relative: 21 % (ref 12–46)
MCH: 30.1 pg (ref 26.0–34.0)
MCHC: 33.4 g/dL (ref 30.0–36.0)
MCV: 90.2 fL (ref 78.0–100.0)
MONO ABS: 0.5 10*3/uL (ref 0.1–1.0)
MONOS PCT: 5 % (ref 3–12)
Neutro Abs: 6.7 10*3/uL (ref 1.7–7.7)
Neutrophils Relative %: 73 % (ref 43–77)
Platelets: 418 10*3/uL — ABNORMAL HIGH (ref 150–400)
RBC: 4.91 MIL/uL (ref 3.87–5.11)
RDW: 13.8 % (ref 11.5–15.5)
WBC: 9.2 10*3/uL (ref 4.0–10.5)

## 2014-03-02 LAB — POCT URINALYSIS DIPSTICK
BILIRUBIN UA: NEGATIVE
Glucose, UA: NEGATIVE
Ketones, UA: NEGATIVE
Leukocytes, UA: NEGATIVE
Nitrite, UA: NEGATIVE
PROTEIN UA: NEGATIVE
RBC UA: NEGATIVE
Spec Grav, UA: 1.015
Urobilinogen, UA: 0.2
pH, UA: 7

## 2014-03-02 LAB — SEDIMENTATION RATE: Sed Rate: 1 mm/hr (ref 0–22)

## 2014-03-02 MED ORDER — OXYCODONE-ACETAMINOPHEN 5-325 MG PO TABS: 1.0000 | ORAL_TABLET | Freq: Every evening | ORAL | Status: DC | PRN

## 2014-03-02 MED ORDER — CYCLOBENZAPRINE HCL 10 MG PO TABS
10.0000 mg | ORAL_TABLET | Freq: Every day | ORAL | Status: DC
Start: 2014-03-02 — End: 2014-06-17

## 2014-03-02 NOTE — Progress Notes (Signed)
   Subjective:    Patient ID: Patricia Newton, female    DOB: 08-24-1971, 43 y.o.   MRN: [id number]  HPI    Review of Systems  HENT: Positive for congestion, postnasal drip and sneezing.   Cardiovascular: Positive for palpitations.  Gastrointestinal: Positive for diarrhea.  Musculoskeletal: Positive for back pain and myalgias.       Objective:   Physical Exam        Assessment & Plan:

## 2014-03-02 NOTE — Progress Notes (Signed)
This chart was scribed for Leandrew Koyanagi, MD by Eston Mould, ED Scribe. This patient was seen in room Room/bed 24 and the patient's care was started at 3:07 PM. Subjective:    Patient ID: Patricia Newton, female    DOB: Dec 29, 1970, 43 y.o.   MRN: [id number]  HPI Patricia Newton is a 43 y.o. female who presents to the [facility] for F/U apt. Pt states she has been well but states she is "having some old age issues". Pt states she is taking Trazadon (for the past 5 years) with Clonoden (prescribed by Dr. Jobie Quaker) and reports having relief and getting adequate sleep. 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   Pt states she sneezes excessively indoor and outdoors. She denies taking Claritin.   Pt states she has palpitations that occur occasionally with SOB. She states the palpitations are short-live that last about 5 mins. Pt denies exercising but reports working an increase amount. She states when she over exerts herself, her HR increases.   Pt states she has had diarrhea "forever". She denies having stress and as a result does not suspect this is stress induced. She denies diarrhea being a new complaint.  Pt states her lower back pain is intermittent that worsens with strenuous labor. She states she occasional has arthralgias and myalgias.   Pt states she has had "lower energy" than normal. She states she is unsure why this is the reason. She states she feels she sleeps well but wakes up feeling different. Pt denies falling asleep while watching tv after work. She states she "does not feel sleepy, just has an energy issue".  Pt has a hx of endometriosis which was laparoscopic. She states she has been "regular" and denies bleeding excessively. Pt has a family hx of breast cancer.  Pt states she has had numbness to her R knee, which is a new CC. She states her last surgery was in the late 1990's. Pt states she is also having numbness to R foot.  Patient Active Problem List   Diagnosis Date Noted  . Endometriosis 09/27/2013  . TMJ (temporomandibular joint syndrome) 02/10/2013  . ANEMIA-NOS 05/26/2009  . ALLERGIC RHINITIS 05/26/2009  . PALPITATIONS 05/26/2009  . CARDIAC MURMUR 05/26/2009  . DYSPNEA 05/26/2009   Current outpatient prescriptions:cloNIDine (CATAPRES) 0.1 MG tablet, , Disp: , Rfl: ;  trazodone (DESYREL) 300 MG tablet, Take 300 mg by mouth at bedtime., Disp: , Rfl: ;  cyclobenzaprine (FLEXERIL) 10 MG tablet, Take 1 tablet (10 mg total) by mouth at bedtime., Disp: 30 tablet, Rfl: 0;  fluticasone (FLONASE) 50 MCG/ACT nasal spray, 2 sprays each nostril daily, Disp: 16 g, Rfl: 6 oxyCODONE-acetaminophen (ROXICET) 5-325 MG per tablet, Take 1 tablet by mouth at bedtime as needed for pain., Disp: 12 tablet, Rfl: 0;  traMADol (ULTRAM) 50 MG tablet, Take 1 tablet (50 mg total) by mouth at bedtime as needed for pain., Disp: 30 tablet, Rfl: 0   Review of Systems  HENT: Positive for congestion, postnasal drip and sneezing.   Cardiovascular: Positive for palpitations.  Gastrointestinal: Positive for diarrhea.  Musculoskeletal: Positive for arthralgias, back pain and myalgias.  All other systems reviewed and are negative.   Objective:   Physical Exam  Nursing note and vitals reviewed. Constitutional: She is oriented to person, place, and time. She appears well-developed and well-nourished. No distress.  HENT:  Head: Normocephalic and atraumatic.  Right Ear: External ear normal.  Left Ear: External ear normal.  Nose: Nose normal.  Mouth/Throat: Oropharynx is clear  and moist.  Eyes: Conjunctivae and EOM are normal. Pupils are equal, round, and reactive to light.  Neck: Normal range of motion. Neck supple.  Cardiovascular: Normal rate, regular rhythm, normal heart sounds and intact distal pulses.   No murmur heard. Pulmonary/Chest: Effort normal and breath sounds normal. No respiratory distress. She has no wheezes. She has no rales.  Abdominal: Soft. Bowel  sounds are normal. She exhibits no distension and no mass. There is no tenderness. There is no rebound and no guarding. Hernia confirmed negative in the right inguinal area and confirmed negative in the left inguinal area.  Genitourinary: Vagina normal and uterus normal. No breast swelling, tenderness or discharge. There is no lesion on the right labia. There is no lesion on the left labia. Uterus is not enlarged and not tender. Cervix exhibits no motion tenderness, no discharge and no friability. Right adnexum displays no mass and no tenderness. Left adnexum displays no mass and no tenderness.  No masses to bilateral breast.  Musculoskeletal:  Negative straight leg raise. Tenderness over the lumbar area with forward bend. Slight numbness along lateral aspect to R knee.   Lymphadenopathy:    She has no cervical adenopathy.  Neurological: She is alert and oriented to person, place, and time.  Psychiatric: She has a normal mood and affect. Her behavior is normal. Thought content normal.    Assessment & Plan:   I have completed the patient encounter in its entirety as documented by the scribe, with editing by me where necessary. Elvera Almario P. Laney Pastor, M.D. Screening for breast cancer - Plan: MM Digital Screening  Routine general medical examination at a health care facility - Plan: POCT urinalysis dipstick, Lipid panel, Sedimentation Rate  Screening for cervical cancer - Plan: Pap IG and HPV (high risk) DNA detection  Other malaise and fatigue - Plan: CBC with Differential, Comprehensive metabolic panel, TSH, Sedimentation Rate  Myalgia and myositis - Plan: Sedimentation Rate,   Chronic back pain-musculoskeletal without disc signs--- referred yoga (PT too expensive)  Okay for intermittent medicines- oxycodone and Flexeril  Palpitations and shortness of breath-unclear etiology but suggesting deconditioning  Meds ordered this encounter  Medications  . oxyCODONE-acetaminophen (ROXICET) 5-325  MG per tablet    Sig: Take 1 tablet by mouth at bedtime as needed.    Dispense:  20 tablet    Refill:  0  . cyclobenzaprine (FLEXERIL) 10 MG tablet    Sig: Take 1 tablet (10 mg total) by mouth at bedtime.    Dispense:  30 tablet    Refill:  3

## 2014-03-03 LAB — LIPID PANEL
Cholesterol: 191 mg/dL (ref 0–200)
HDL: 74 mg/dL (ref >39)
LDL Cholesterol: 83 mg/dL (ref 0–99)
TRIGLYCERIDES: 172 mg/dL — AB (ref <150)
Total CHOL/HDL Ratio: 2.6 Ratio
VLDL: 34 mg/dL (ref 0–40)

## 2014-03-03 LAB — COMPREHENSIVE METABOLIC PANEL
ALK PHOS: 36 U/L — AB (ref 39–117)
ALT: 9 U/L (ref 0–35)
AST: 12 U/L (ref 0–37)
Albumin: 4.7 g/dL (ref 3.5–5.2)
BUN: 10 mg/dL (ref 6–23)
CO2: 26 meq/L (ref 19–32)
Calcium: 9.9 mg/dL (ref 8.4–10.5)
Chloride: 103 mEq/L (ref 96–112)
Creat: 0.81 mg/dL (ref 0.50–1.10)
Glucose, Bld: 94 mg/dL (ref 70–99)
Potassium: 4.4 mEq/L (ref 3.5–5.3)
Sodium: 137 mEq/L (ref 135–145)
Total Bilirubin: 0.4 mg/dL (ref 0.2–1.2)
Total Protein: 7.4 g/dL (ref 6.0–8.3)

## 2014-03-03 LAB — PAP IG AND HPV HIGH-RISK: HPV DNA High Risk: NOT DETECTED

## 2014-03-03 LAB — TSH: TSH: 1.143 u[IU]/mL (ref 0.350–4.500)

## 2014-03-05 ENCOUNTER — Encounter: Payer: Self-pay | Admitting: Internal Medicine

## 2014-03-09 ENCOUNTER — Other Ambulatory Visit: Payer: Self-pay | Admitting: Internal Medicine

## 2014-03-09 ENCOUNTER — Encounter: Payer: Managed Care, Other (non HMO) | Admitting: Internal Medicine

## 2014-03-09 DIAGNOSIS — Z1231 Encounter for screening mammogram for malignant neoplasm of breast: Secondary | ICD-10-CM

## 2014-03-16 ENCOUNTER — Telehealth: Payer: Self-pay

## 2014-03-16 MED ORDER — OXYCODONE-ACETAMINOPHEN 5-325 MG PO TABS
1.0000 | ORAL_TABLET | Freq: Three times a day (TID) | ORAL | Status: DC | PRN
Start: 2014-03-16 — End: 2014-09-15

## 2014-03-16 NOTE — Telephone Encounter (Signed)
Accidentally threw away in store bag. Also pharmacy stated the way the rx was written need to either be changed or the medication needs to be change.  It is too early for her to p/u the refill the way it was written.

## 2014-03-16 NOTE — Telephone Encounter (Signed)
Patient states she called MOnday - she threw away her script for oxyCODONE-acetaminophen (ROXICET) 5-325 MG per tablet And is requesting a new one.   It also needs to be written differently for the insurance company to cover the prescription.    Please call [phone]

## 2014-03-16 NOTE — Telephone Encounter (Signed)
This needs to be addressed by Dr. Laney Pastor.

## 2014-03-16 NOTE — Telephone Encounter (Signed)
Ok to replace Meds ordered this encounter  Medications  . oxyCODONE-acetaminophen (ROXICET) 5-325 MG per tablet    Sig: Take 1 tablet by mouth every 8 (eight) hours as needed.    Dispense:  20 tablet    Refill:  0

## 2014-03-17 NOTE — Telephone Encounter (Signed)
In Dr [employer] box for signature.

## 2014-03-17 NOTE — Telephone Encounter (Signed)
Patient came in to 104 to ask about status of rx for oxycodone I told her that Dr. Ninfa Meeker assistant or someone clinical will call her when ready for pick up.

## 2014-03-18 NOTE — Telephone Encounter (Signed)
Notified pt Rx ready on VM.

## 2014-03-24 ENCOUNTER — Telehealth: Payer: Self-pay

## 2014-03-24 NOTE — Telephone Encounter (Signed)
Can we change medication to Tramadol?

## 2014-03-24 NOTE — Telephone Encounter (Signed)
PT STATES THE PAIN MEDICINE SHE WAS GIVEN BY DR DOOLITTLE MAKES HER REALLY SLEEPY AND GROGGY, WOULD LIKE TO TRY TRAMADOL INSTEAD SINCE THAT SEEMS TO WORK GOOD FOR HER. PLEASE CALL PT AT [phone]     [facility] [address], PT STATED WE SENT HER MEDS TO THE WRONG PHARMACY THE LAST TIME AND SHE ALWAYS WANT TO USE THE [facility]

## 2014-03-25 MED ORDER — TRAMADOL HCL 50 MG PO TABS: ORAL_TABLET | ORAL | Status: DC

## 2014-03-25 NOTE — Telephone Encounter (Signed)
Ok to call in tramadol 1-2 q6h prn #[id number]

## 2014-03-25 NOTE — Telephone Encounter (Signed)
Pt notified that rx was called in

## 2014-04-06 ENCOUNTER — Ambulatory Visit: Payer: Managed Care, Other (non HMO)

## 2014-04-07 ENCOUNTER — Other Ambulatory Visit: Payer: Self-pay | Admitting: Internal Medicine

## 2014-04-08 NOTE — Telephone Encounter (Signed)
Called in and pt notified on VM about f/up needed.

## 2014-04-08 NOTE — Telephone Encounter (Signed)
Needs f/u for back pain before another refill

## 2014-04-14 ENCOUNTER — Ambulatory Visit: Payer: Managed Care, Other (non HMO)

## 2014-05-04 ENCOUNTER — Ambulatory Visit: Payer: BC Managed Care – PPO | Admitting: Internal Medicine

## 2014-05-09 ENCOUNTER — Ambulatory Visit (INDEPENDENT_AMBULATORY_CARE_PROVIDER_SITE_OTHER): Payer: BC Managed Care – PPO | Admitting: Internal Medicine

## 2014-05-09 VITALS — BP 120/88 | HR 111 | Temp 98.7°F | Resp 18 | Ht 69.0 in | Wt 184.0 lb

## 2014-05-09 DIAGNOSIS — J019 Acute sinusitis, unspecified: Secondary | ICD-10-CM

## 2014-05-09 DIAGNOSIS — R5381 Other malaise: Secondary | ICD-10-CM

## 2014-05-09 DIAGNOSIS — R61 Generalized hyperhidrosis: Secondary | ICD-10-CM

## 2014-05-09 DIAGNOSIS — R059 Cough, unspecified: Secondary | ICD-10-CM

## 2014-05-09 DIAGNOSIS — R5383 Other fatigue: Secondary | ICD-10-CM

## 2014-05-09 DIAGNOSIS — R05 Cough: Secondary | ICD-10-CM

## 2014-05-09 MED ORDER — HYDROCOD POLST-CHLORPHEN POLST 10-8 MG/5ML PO LQCR: 5.0000 mL | Freq: Two times a day (BID) | ORAL | Status: DC | PRN

## 2014-05-09 MED ORDER — AZITHROMYCIN 500 MG PO TABS: 500.0000 mg | ORAL_TABLET | Freq: Every day | ORAL | Status: DC

## 2014-05-09 NOTE — Patient Instructions (Addendum)
Cough, Adult  A cough is a reflex that helps clear your throat and airways. It can help heal the body or may be a reaction to an irritated airway. A cough may only last 2 or 3 weeks (acute) or may last more than 8 weeks (chronic).  CAUSES Acute cough:  Viral or bacterial infections. Chronic cough:  Infections.  Allergies.  Asthma.  Post-nasal drip.  Smoking.  Heartburn or acid reflux.  Some medicines.  Chronic lung problems (COPD).  Cancer. SYMPTOMS   Cough.  Fever.  Chest pain.  Increased breathing rate.  High-pitched whistling sound when breathing (wheezing).  Colored mucus that you cough up (sputum). TREATMENT   A bacterial cough may be treated with antibiotic medicine.  A viral cough must run its course and will not respond to antibiotics.  Your caregiver may recommend other treatments if you have a chronic cough. HOME CARE INSTRUCTIONS   Only take over-the-counter or prescription medicines for pain, discomfort, or fever as directed by your caregiver. Use cough suppressants only as directed by your caregiver.  Use a cold steam vaporizer or humidifier in your bedroom or home to help loosen secretions.  Sleep in a semi-upright position if your cough is worse at night.  Rest as needed.  Stop smoking if you smoke. SEEK IMMEDIATE MEDICAL CARE IF:   You have pus in your sputum.  Your cough starts to worsen.  You cannot control your cough with suppressants and are losing sleep.  You begin coughing up blood.  You have difficulty breathing.  You develop pain which is getting worse or is uncontrolled with medicine.  You have a fever. MAKE SURE YOU:   Understand these instructions.  Will watch your condition.  Will get help right away if you are not doing well or get worse. Document Released: 06/07/2011 Document Revised: 03/02/2012 Document Reviewed: 06/07/2011 [employer] Patient Information 2014 [employer]. Sinusitis Sinusitis is redness,  soreness, and swelling (inflammation) of the paranasal sinuses. Paranasal sinuses are air pockets within the bones of your face (beneath the eyes, the middle of the forehead, or above the eyes). In healthy paranasal sinuses, mucus is able to drain out, and air is able to circulate through them by way of your nose. However, when your paranasal sinuses are inflamed, mucus and air can become trapped. This can allow bacteria and other germs to grow and cause infection. Sinusitis can develop quickly and last only a short time (acute) or continue over a long period (chronic). Sinusitis that lasts for more than 12 weeks is considered chronic.  CAUSES  Causes of sinusitis include:  Allergies.  Structural abnormalities, such as displacement of the cartilage that separates your nostrils (deviated septum), which can decrease the air flow through your nose and sinuses and affect sinus drainage.  Functional abnormalities, such as when the small hairs (cilia) that line your sinuses and help remove mucus do not work properly or are not present. SYMPTOMS  Symptoms of acute and chronic sinusitis are the same. The primary symptoms are pain and pressure around the affected sinuses. Other symptoms include:  Upper toothache.  Earache.  Headache.  Bad breath.  Decreased sense of smell and taste.  A cough, which worsens when you are lying flat.  Fatigue.  Fever.  Thick drainage from your nose, which often is green and may contain pus (purulent).  Swelling and warmth over the affected sinuses. DIAGNOSIS  Your caregiver will perform a physical exam. During the exam, your caregiver may:  Look in   your nose for signs of abnormal growths in your nostrils (nasal polyps).  Tap over the affected sinus to check for signs of infection.  View the inside of your sinuses (endoscopy) with a special imaging device with a light attached (endoscope), which is inserted into your sinuses. If your caregiver suspects  that you have chronic sinusitis, one or more of the following tests may be recommended:  Allergy tests.  Nasal culture A sample of mucus is taken from your nose and sent to a lab and screened for bacteria.  Nasal cytology A sample of mucus is taken from your nose and examined by your caregiver to determine if your sinusitis is related to an allergy. TREATMENT  Most cases of acute sinusitis are related to a viral infection and will resolve on their own within 10 days. Sometimes medicines are prescribed to help relieve symptoms (pain medicine, decongestants, nasal steroid sprays, or saline sprays).  However, for sinusitis related to a bacterial infection, your caregiver will prescribe antibiotic medicines. These are medicines that will help kill the bacteria causing the infection.  Rarely, sinusitis is caused by a fungal infection. In theses cases, your caregiver will prescribe antifungal medicine. For some cases of chronic sinusitis, surgery is needed. Generally, these are cases in which sinusitis recurs more than 3 times per year, despite other treatments. HOME CARE INSTRUCTIONS   Drink plenty of water. Water helps thin the mucus so your sinuses can drain more easily.  Use a humidifier.  Inhale steam 3 to 4 times a day (for example, sit in the bathroom with the shower running).  Apply a warm, moist washcloth to your face 3 to 4 times a day, or as directed by your caregiver.  Use saline nasal sprays to help moisten and clean your sinuses.  Take over-the-counter or prescription medicines for pain, discomfort, or fever only as directed by your caregiver. SEEK IMMEDIATE MEDICAL CARE IF:  You have increasing pain or severe headaches.  You have nausea, vomiting, or drowsiness.  You have swelling around your face.  You have vision problems.  You have a stiff neck.  You have difficulty breathing. MAKE SURE YOU:   Understand these instructions.  Will watch your condition.  Will get  help right away if you are not doing well or get worse. Document Released: 12/09/2005 Document Revised: 03/02/2012 Document Reviewed: 12/24/2011 [employer] Patient Information 2014 [employer].  

## 2014-05-09 NOTE — Progress Notes (Signed)
   Subjective:    Patient ID: Patricia Newton, female    DOB: 10-15-1971, 43 y.o.   MRN: [id number]  HPI 43 year old pt here for sinus congestion, post nasal draining, cough, and a scratchy throat. Her symptoms around 43 weeks ago. Pt says it has become worse within the last 43 days. She has been taking nyquil at night to help with the cough. She also tried Zyrtec to see if it was from allergies, but no relief. Her children have also had some sinus congestion. When she blows her nose during the day it is clear mucous; first in the morning it will be yellowish mucous. She is more concerned with the cough because it is keeping her up at night.    Review of Systems     Objective:   Physical Exam        Assessment & Plan:

## 2014-06-17 ENCOUNTER — Other Ambulatory Visit: Payer: Self-pay | Admitting: Internal Medicine

## 2014-06-20 ENCOUNTER — Other Ambulatory Visit: Payer: Self-pay | Admitting: Internal Medicine

## 2014-06-21 NOTE — Telephone Encounter (Signed)
Faxed

## 2014-06-22 ENCOUNTER — Ambulatory Visit: Payer: BC Managed Care – PPO | Admitting: Internal Medicine

## 2014-06-29 ENCOUNTER — Ambulatory Visit (INDEPENDENT_AMBULATORY_CARE_PROVIDER_SITE_OTHER): Payer: BC Managed Care – PPO | Admitting: Family Medicine

## 2014-06-29 VITALS — BP 122/80 | HR 98 | Temp 98.7°F | Resp 16 | Ht 69.0 in | Wt 186.0 lb

## 2014-06-29 DIAGNOSIS — R0789 Other chest pain: Secondary | ICD-10-CM

## 2014-06-29 DIAGNOSIS — R071 Chest pain on breathing: Secondary | ICD-10-CM

## 2014-06-29 DIAGNOSIS — B029 Zoster without complications: Secondary | ICD-10-CM

## 2014-06-29 MED ORDER — VALACYCLOVIR HCL 1 G PO TABS: 1000.0000 mg | ORAL_TABLET | Freq: Three times a day (TID) | ORAL | Status: DC

## 2014-06-29 MED ORDER — HYDROCODONE-ACETAMINOPHEN 5-325 MG PO TABS: 1.0000 | ORAL_TABLET | Freq: Four times a day (QID) | ORAL | Status: DC | PRN

## 2014-06-29 NOTE — Progress Notes (Signed)
Subjective:    Patient ID: Patricia Newton, female    DOB: 23-Mar-1971, 43 y.o.   MRN: [id number]  HPI Patricia Newton is a 43 y.o. female  Slight rash on L breast - first noticed dt pain from sheet in bed 2 nights ago.  Noticed small rash on the breast then. Hurts for anything touching that skin, burning pain, sharp shooting pain at times. NKI, no new exercises or activities.   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   Tx: ibuprofen.  Has tramadol at home (hx of low back pain)- few doses used - not helping much. Has had upset stomach in past possibly with hydrocodone, but other med prescribed by Dr. Laney Pastor that started with O tolerated better. Did tolerate cough syrup with hydrocodone in May.    Patient Active Problem List   Diagnosis Date Noted  . Endometriosis 09/27/2013  . TMJ (temporomandibular joint syndrome) 02/10/2013  . ANEMIA-NOS 05/26/2009  . ALLERGIC RHINITIS 05/26/2009  . PALPITATIONS 05/26/2009  . CARDIAC MURMUR 05/26/2009  . DYSPNEA 05/26/2009   Past Medical History  Diagnosis Date  . PONV (postoperative nausea and vomiting)   . Arthritis   . No pertinent past medical history   . GERD (gastroesophageal reflux disease)     tums prn  . Allergy   . Asthma   . Heart murmur    Past Surgical History  Procedure Laterality Date  . Diagnostic laparoscopy      x3 for endometriosis  . Appendectomy    . Shoulder arthroscopy  right  . Knee arthroscopy      right-x3  . Tendon exploration  03/11/2012    Procedure: TENDON EXPLORATION;  Surgeon: Wynonia Sours, MD;  Location: [facility];  Service: Orthopedics;  Laterality: Left;  EXPLORATION OF FLEXOR TENDON LEFT INDEX FINGER     . Left hand surgery  03/11/12  . Laparoscopy  03/26/2012    Procedure: LAPAROSCOPY OPERATIVE;  Surgeon: Lovenia Kim, MD;  Location: [facility];  Service: Gynecology;  Laterality: N/A;  with ablation of  endometriosis    Allergies  Allergen Reactions  . Erythromycin Diarrhea  . Vancomycin Cross Reactors Hives   Prior to Admission medications   Medication Sig Start Date End Date Taking? Authorizing Provider  cloNIDine (CATAPRES) 0.1 MG tablet  10/05/13  Yes Historical Provider, MD  cyclobenzaprine (FLEXERIL) 10 MG tablet TAKE 1 TABLET BY MOUTH AT BEDTIME   Yes Leandrew Koyanagi, MD  traMADol (ULTRAM) 50 MG tablet TAKE 1 TO 2 TABLETS BY MOUTH EVERY 6 HOURS AS NEEDED   Yes Leandrew Koyanagi, MD  trazodone (DESYREL) 300 MG tablet Take 300 mg by mouth at bedtime.   Yes Historical Provider, MD  azithromycin (ZITHROMAX) 500 MG tablet Take 1 tablet (500 mg total) by mouth daily. 05/09/14   Orma Flaming, MD  chlorpheniramine-HYDROcodone [facility] PENNKINETIC ER) 10-8 MG/5ML LQCR Take 5 mLs by mouth every 12 (twelve) hours as needed for cough. 05/09/14   Orma Flaming, MD  fluticasone Asencion Islam) 50 MCG/ACT nasal spray 2 sprays each nostril daily 09/10/12   Wardell Honour, MD  oxyCODONE-acetaminophen (ROXICET) 5-325 MG per tablet Take 1 tablet by mouth every 8 (eight) hours as needed. 03/16/14   Leandrew Koyanagi, MD   History   Social History  . Marital Status: Married    Spouse Name: N/A    Number of Children: N/A  .  Years of Education: N/A   Occupational History  . Not on file.   Social History Main Topics  . Smoking status: Current Every Day Smoker -- 0.50 packs/day for 15 years    Types: Cigarettes  . Smokeless tobacco: Never Used  . Alcohol Use: No     Comment: rare  . Drug Use: No  . Sexual Activity: Yes    Birth Control/ Protection: None   Other Topics Concern  . Not on file   Social History Narrative  . No narrative on file        Review of Systems  Constitutional: Negative for fever and chills.  Respiratory: Negative for cough and shortness of breath.   Cardiovascular: Negative for chest pain (chest wall/skin only. ).  Skin: Positive for rash.       Objective:    Physical Exam  Vitals reviewed. Constitutional: She is oriented to person, place, and time. She appears well-developed and well-nourished.  HENT:  Head: Normocephalic and atraumatic.  Eyes: Conjunctivae and EOM are normal. Pupils are equal, round, and reactive to light.  Neck: Carotid bruit is not present.  Cardiovascular: Normal rate, regular rhythm, normal heart sounds and intact distal pulses.   Pulmonary/Chest: Effort normal and breath sounds normal. She exhibits tenderness. Right breast exhibits no nipple discharge. Left breast exhibits tenderness. Left breast exhibits no nipple discharge. Breasts are symmetrical.    Abdominal: Soft. She exhibits no pulsatile midline mass. There is no tenderness.  Neurological: She is alert and oriented to person, place, and time.  Skin: Skin is warm and dry.  Psychiatric: She has a normal mood and affect. Her behavior is normal.   Filed Vitals:   06/29/14 1120  BP: 122/80  Pulse: 98  Temp: 98.7 F (37.1 C)  TempSrc: Oral  Resp: 16  Height: 5\' 9"  (1.753 m)  Weight: 186 lb (84.369 kg)  SpO2: 100%    Chart reviewed - last Rx of 5/325mg  #20 on 03/16/14.  No concerning findings on controlled substance database.      Assessment & Plan:   Sacred Roa is a 43 y.o. female Chest wall pain - Plan: valACYclovir (VALTREX) 1000 MG tablet, HYDROcodone-acetaminophen (NORCO/VICODIN) 5-325 MG per tablet  Shingles - Plan: valACYclovir (VALTREX) 1000 MG tablet, HYDROcodone-acetaminophen (NORCO/VICODIN) 5-325 MG per tablet  Suspected early zoster at approx t3-4 on left   - start valtrex, ibuprofen for sx care, and if needed - hydrocodone for more severe pain. SED.  Has tolerated hydrocodone cough syrup, and was sedated during day with oxycodone prior.  start hydrocodone, but if no relief with this - consider oxycodone.   -RTC precautions.   Meds ordered this encounter  Medications  . valACYclovir (VALTREX) 1000 MG tablet    Sig: Take 1 tablet  (1,000 mg total) by mouth 3 (three) times daily.    Dispense:  21 tablet    Refill:  0  . HYDROcodone-acetaminophen (NORCO/VICODIN) 5-325 MG per tablet    Sig: Take 1-2 tablets by mouth every 6 (six) hours as needed for moderate pain.    Dispense:  30 tablet    Refill:  0   Patient Instructions  Start the Valtrex today.  Keep rash clean and covered - see information below. Ibuprofen up to 600mg  every 6 hours with food. Hydrocodone 1-2 every 6 hours as needed for more severe pain.  Return to the clinic or go to the nearest emergency room if any of your symptoms worsen or new symptoms occur.   Shingles  Shingles (herpes zoster) is an infection that is caused by the same virus that causes chickenpox (varicella). The infection causes a painful skin rash and fluid-filled blisters, which eventually break open, crust over, and heal. It may occur in any area of the body, but it usually affects only one side of the body or face. The pain of shingles usually lasts about 1 month. However, some people with shingles may develop long-term (chronic) pain in the affected area of the body. Shingles often occurs many years after the person had chickenpox. It is more common:  In people older than 50 years.  In people with weakened immune systems, such as those with HIV, AIDS, or cancer.  In people taking medicines that weaken the immune system, such as transplant medicines.  In people under great stress. CAUSES  Shingles is caused by the varicella zoster virus (VZV), which also causes chickenpox. After a person is infected with the virus, it can remain in the person's body for years in an inactive state (dormant). To cause shingles, the virus reactivates and breaks out as an infection in a nerve root. The virus can be spread from person to person (contagious) through contact with open blisters of the shingles rash. It will only spread to people who have not had chickenpox. When these people are exposed to the  virus, they may develop chickenpox. They will not develop shingles. Once the blisters scab over, the person is no longer contagious and cannot spread the virus to others. SYMPTOMS  Shingles shows up in stages. The initial symptoms may be pain, itching, and tingling in an area of the skin. This pain is usually described as burning, stabbing, or throbbing.In a few days or weeks, a painful red rash will appear in the area where the pain, itching, and tingling were felt. The rash is usually on one side of the body in a band or belt-like pattern. Then, the rash usually turns into fluid-filled blisters. They will scab over and dry up in approximately 2-3 weeks. Flu-like symptoms may also occur with the initial symptoms, the rash, or the blisters. These may include:  Fever.  Chills.  Headache.  Upset stomach. DIAGNOSIS  Your caregiver will perform a skin exam to diagnose shingles. Skin scrapings or fluid samples may also be taken from the blisters. This sample will be examined under a microscope or sent to a lab for further testing. TREATMENT  There is no specific cure for shingles. Your caregiver will likely prescribe medicines to help you manage the pain, recover faster, and avoid long-term problems. This may include antiviral drugs, anti-inflammatory drugs, and pain medicines. HOME CARE INSTRUCTIONS   Take a cool bath or apply cool compresses to the area of the rash or blisters as directed. This may help with the pain and itching.   Only take over-the-counter or prescription medicines as directed by your caregiver.   Rest as directed by your caregiver.  Keep your rash and blisters clean with mild soap and cool water or as directed by your caregiver.  Do not pick your blisters or scratch your rash. Apply an anti-itch cream or numbing creams to the affected area as directed by your caregiver.  Keep your shingles rash covered with a loose bandage (dressing).  Avoid skin contact  with:  Babies.   Pregnant women.   Children with eczema.   Elderly people with transplants.   People with chronic illnesses, such as leukemia or AIDS.   Wear loose-fitting clothing to help ease the  pain of material rubbing against the rash.  Keep all follow-up appointments with your caregiver.If the area involved is on your face, you may receive a referral for follow-up to a specialist, such as an eye doctor (ophthalmologist) or an ear, nose, and throat (ENT) doctor. Keeping all follow-up appointments will help you avoid eye complications, chronic pain, or disability.  SEEK IMMEDIATE MEDICAL CARE IF:   You have facial pain, pain around the eye area, or loss of feeling on one side of your face.  You have ear pain or ringing in your ear.  You have loss of taste.  Your pain is not relieved with prescribed medicines.   Your redness or swelling spreads.   You have more pain and swelling.  Your condition is worsening or has changed.   You have a feveror persistent symptoms for more than 2-3 days.  You have a fever and your symptoms suddenly get worse. MAKE SURE YOU:  Understand these instructions.  Will watch your condition.  Will get help right away if you are not doing well or get worse. Document Released: 12/09/2005 Document Revised: 09/02/2012 Document Reviewed: 07/23/2012 [facility] Patient Information 2015 [place], [place]. This information is not intended to replace advice given to you by your health care provider. Make sure you discuss any questions you have with your health care provider.

## 2014-06-29 NOTE — Patient Instructions (Addendum)
Start the Valtrex today.  Keep rash clean and covered - see information below. Ibuprofen up to 600mg  every 6 hours with food. Hydrocodone 1-2 every 6 hours as needed for more severe pain.  Return to the clinic or go to the nearest emergency room if any of your symptoms worsen or new symptoms occur.   Shingles Shingles (herpes zoster) is an infection that is caused by the same virus that causes chickenpox (varicella). The infection causes a painful skin rash and fluid-filled blisters, which eventually break open, crust over, and heal. It may occur in any area of the body, but it usually affects only one side of the body or face. The pain of shingles usually lasts about 1 month. However, some people with shingles may develop long-term (chronic) pain in the affected area of the body. Shingles often occurs many years after the person had chickenpox. It is more common:  In people older than 50 years.  In people with weakened immune systems, such as those with HIV, AIDS, or cancer.  In people taking medicines that weaken the immune system, such as transplant medicines.  In people under great stress. CAUSES  Shingles is caused by the varicella zoster virus (VZV), which also causes chickenpox. After a person is infected with the virus, it can remain in the person's body for years in an inactive state (dormant). To cause shingles, the virus reactivates and breaks out as an infection in a nerve root. The virus can be spread from person to person (contagious) through contact with open blisters of the shingles rash. It will only spread to people who have not had chickenpox. When these people are exposed to the virus, they may develop chickenpox. They will not develop shingles. Once the blisters scab over, the person is no longer contagious and cannot spread the virus to others. SYMPTOMS  Shingles shows up in stages. The initial symptoms may be pain, itching, and tingling in an area of the skin. This pain is  usually described as burning, stabbing, or throbbing.In a few days or weeks, a painful red rash will appear in the area where the pain, itching, and tingling were felt. The rash is usually on one side of the body in a band or belt-like pattern. Then, the rash usually turns into fluid-filled blisters. They will scab over and dry up in approximately 2-3 weeks. Flu-like symptoms may also occur with the initial symptoms, the rash, or the blisters. These may include:  Fever.  Chills.  Headache.  Upset stomach. DIAGNOSIS  Your caregiver will perform a skin exam to diagnose shingles. Skin scrapings or fluid samples may also be taken from the blisters. This sample will be examined under a microscope or sent to a lab for further testing. TREATMENT  There is no specific cure for shingles. Your caregiver will likely prescribe medicines to help you manage the pain, recover faster, and avoid long-term problems. This may include antiviral drugs, anti-inflammatory drugs, and pain medicines. HOME CARE INSTRUCTIONS   Take a cool bath or apply cool compresses to the area of the rash or blisters as directed. This may help with the pain and itching.   Only take over-the-counter or prescription medicines as directed by your caregiver.   Rest as directed by your caregiver.  Keep your rash and blisters clean with mild soap and cool water or as directed by your caregiver.  Do not pick your blisters or scratch your rash. Apply an anti-itch cream or numbing creams to the affected area  as directed by your caregiver.  Keep your shingles rash covered with a loose bandage (dressing).  Avoid skin contact with:  Babies.   Pregnant women.   Children with eczema.   Elderly people with transplants.   People with chronic illnesses, such as leukemia or AIDS.   Wear loose-fitting clothing to help ease the pain of material rubbing against the rash.  Keep all follow-up appointments with your caregiver.If  the area involved is on your face, you may receive a referral for follow-up to a specialist, such as an eye doctor (ophthalmologist) or an ear, nose, and throat (ENT) doctor. Keeping all follow-up appointments will help you avoid eye complications, chronic pain, or disability.  SEEK IMMEDIATE MEDICAL CARE IF:   You have facial pain, pain around the eye area, or loss of feeling on one side of your face.  You have ear pain or ringing in your ear.  You have loss of taste.  Your pain is not relieved with prescribed medicines.   Your redness or swelling spreads.   You have more pain and swelling.  Your condition is worsening or has changed.   You have a feveror persistent symptoms for more than 2-3 days.  You have a fever and your symptoms suddenly get worse. MAKE SURE YOU:  Understand these instructions.  Will watch your condition.  Will get help right away if you are not doing well or get worse. Document Released: 12/09/2005 Document Revised: 09/02/2012 Document Reviewed: 07/23/2012 [facility] Patient Information 2015 [place], [place]. This information is not intended to replace advice given to you by your health care provider. Make sure you discuss any questions you have with your health care provider.

## 2014-07-01 ENCOUNTER — Other Ambulatory Visit: Payer: Self-pay | Admitting: Internal Medicine

## 2014-07-03 ENCOUNTER — Telehealth: Payer: Self-pay

## 2014-07-03 DIAGNOSIS — R0789 Other chest pain: Secondary | ICD-10-CM

## 2014-07-03 DIAGNOSIS — B029 Zoster without complications: Secondary | ICD-10-CM

## 2014-07-03 NOTE — Telephone Encounter (Signed)
Pt. Called in stating that she was in the office on Wednesday 06/29/14 and saw Dr. Carlota Raspberry for Shingles. He gave her a RX for HYDROcodone-acetaminophen (NORCO/VICODIN) 5-325 MG per tablet she stated that she was told to take one tab every 6 hours but that wasn't taking the pain away so she started taking 2 tabs. She spoke with her pharmacist and he told her to call back up here and get the doctor to give her a RX for the Hydrocodone 10MG  instead of the 5MG  that way she will only have to take one tab. Also she stated that since she upped the dose that she is almost out of pills and needs a refill.   She would like someone to call her back and let her know something. Her number is [phone] and she said that her phone sometimes doesn't ring but that it is ok to leave a detailed message on her voicemail.

## 2014-07-04 MED ORDER — HYDROCODONE-ACETAMINOPHEN 5-325 MG PO TABS: 1.0000 | ORAL_TABLET | Freq: Four times a day (QID) | ORAL | Status: DC | PRN

## 2014-07-04 NOTE — Telephone Encounter (Signed)
Rx is up front for p/u when she calls back.

## 2014-07-04 NOTE — Telephone Encounter (Signed)
Left message on machine to call back  

## 2014-07-04 NOTE — Telephone Encounter (Signed)
Pt notified of Dr. Vonna Kotyk instructions.

## 2014-07-04 NOTE — Telephone Encounter (Signed)
Patient is still having problems with Shingles and states she will run out of her meds tomorrow. Please advise  [phone]

## 2014-07-04 NOTE — Telephone Encounter (Signed)
Ok to take 1 to 2 of the hydrocodone every 6 hours as needed for pain.  As symptoms improve may only need one tablet, so would keep same dose for now. Recheck in next week if not improving. Sooner if worse. Ready for pickup.

## 2014-07-04 NOTE — Telephone Encounter (Signed)
Dr. Carlota Raspberry, Please advise.  Thank you.

## 2014-07-05 NOTE — Telephone Encounter (Signed)
Faxed

## 2014-07-08 ENCOUNTER — Telehealth: Payer: Self-pay

## 2014-07-08 NOTE — Telephone Encounter (Signed)
Pt states the pain pill is causing her to itch. She would like Tramadol 50mg  tablet. Advised her to stop taking Hydrocodone. Can she have a refill on the Tramadol?

## 2014-07-08 NOTE — Telephone Encounter (Signed)
The hydrocodone she was prescribed for shingles is making her itch, would like to know if she could be re prescribed tramadol, and if so, she said she would need more than 20 because it does not work as well

## 2014-07-09 MED ORDER — TRAMADOL HCL 50 MG PO TABS: 50.0000 mg | ORAL_TABLET | Freq: Four times a day (QID) | ORAL | Status: DC | PRN

## 2014-07-09 NOTE — Telephone Encounter (Signed)
Noted.  Ultram as planned and RTC if not improving with course of this ultram. Thanks.

## 2014-07-09 NOTE — Telephone Encounter (Signed)
Prior message incomplete.  Should read: She is now 12 days into this, so should have some improvement soon. Additionally, when did she start to itch as I prescribed hydrocodone on both the 8th and the 13th? I can prescribe ultram, 1-2 every 6 hours as needed, but repeat OV needed prior to further refills after this to make sure no other eval or different treatment needed.

## 2014-07-09 NOTE — Telephone Encounter (Signed)
LMOM of this info Dr. Carlota Raspberry, Juluis Rainier

## 2014-07-09 NOTE — Telephone Encounter (Signed)
As she is now 10 days into this, the pain should be improving soon. I prescribed hydrocodone  Will send in Rx for Ultram, but will need to be seen for recheck prior to this Rx running out if still in pain to make sure no other cause or different treatment needed.

## 2014-07-09 NOTE — Telephone Encounter (Signed)
Patient calling to check the status of her medication being changed from Hydrocodone to Tramadol. Patient states when she got her 2nd prescription "hydrocodone" filled on 07/06/14 she started itching on her face and neck. Patient has small children and "hydrocodone" makes her feel out of it Patient is requesting Tramadol because she has taking it before and it does not cause her any itching and it isn't as strong. Please contact patient at [phone]

## 2014-07-22 ENCOUNTER — Telehealth: Payer: Self-pay

## 2014-07-22 NOTE — Telephone Encounter (Signed)
Pt would like to see if she can get a refill on the medications she was prescribed. She is unable to come back to clinic for another visit. She does not have the money for a co-pay again. 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 The rash is the same as the one that was on her breast when she was seen.

## 2014-07-22 NOTE — Telephone Encounter (Signed)
Advised pt to RTC 

## 2014-07-22 NOTE — Telephone Encounter (Signed)
Patient has a new rash develop on her leg.  She is currently being treated for shingles.    Please call she has questions  [phone]

## 2014-07-22 NOTE — Telephone Encounter (Signed)
She needs to RTC for evaluation of this new rash

## 2014-07-23 NOTE — Telephone Encounter (Signed)
Agree with RTC, as want to make sure we are treating the right condition, and if this rash just started, recommend eval on Sunday (as if it does look like shingles, early treatment with antiviral may be indicated).

## 2014-07-23 NOTE — Telephone Encounter (Signed)
It would be rare that she has shingles again and in a different place since she just had it 1 month ago.  An OV would be most appropriate.

## 2014-07-24 ENCOUNTER — Telehealth: Payer: Self-pay

## 2014-07-24 NOTE — Telephone Encounter (Signed)
I spoke with patient, and advised that it is best that she RTC per Dr Carlota Raspberry. Patient says she does not have the money to come back in because she just lost her job. I advised that we can see about billing her for this one visit. She then asked why we can just send in the medication to pharmacy, I advised that our doctors feel that seeing her again is the most appropriate course of action for her.  Patient states she is going to check with her mom to see if she has anything that she can use to treat the rash.

## 2014-08-17 ENCOUNTER — Ambulatory Visit: Payer: BC Managed Care – PPO | Admitting: Internal Medicine

## 2014-08-19 ENCOUNTER — Telehealth: Payer: Self-pay

## 2014-08-19 NOTE — Telephone Encounter (Signed)
Pt of Dr. Laney Pastor,  Pt is prescribed trazodone (DESYREL) 300 MG tablet [[id number]] and cloNIDine (CATAPRES) 0.1 MG tablet [[id number]] by Dr Hues, her phychiatric Dr, for insomnia, but she says it is becoming too expensive to go back every 32m/o to have these refills done. She would like to know if Dr. Laney Pastor would be willing to prescribe her these?  Also, she would like a refill on  traMADol (ULTRAM) 50 MG tablet [[id number]] for her back ( Says Dr. Laney Pastor is the prescribing Dr. On this medication)   [phone]

## 2014-08-20 NOTE — Telephone Encounter (Signed)
Ok to call in tramadol as last written by me I can do her prescriptions for traz and klonopin if needed

## 2014-08-21 MED ORDER — TRAMADOL HCL 50 MG PO TABS: 50.0000 mg | ORAL_TABLET | Freq: Four times a day (QID) | ORAL | Status: DC | PRN

## 2014-08-21 MED ORDER — TRAZODONE HCL 300 MG PO TABS: 300.0000 mg | ORAL_TABLET | Freq: Every day | ORAL | Status: DC

## 2014-08-21 MED ORDER — CLONIDINE HCL 0.1 MG PO TABS: 0.1000 mg | ORAL_TABLET | Freq: Every day | ORAL | Status: DC

## 2014-08-21 NOTE — Telephone Encounter (Signed)
Clonidine

## 2014-08-21 NOTE — Telephone Encounter (Signed)
Tramadol called in. Can you RF the trazodone and clonidine for her please. Thanks.

## 2014-08-21 NOTE — Telephone Encounter (Signed)
OK TRAZ ?CLONIDINE OR KLONOPIN

## 2014-08-21 NOTE — Addendum Note (Signed)
Addended by: Leandrew Koyanagi on: 08/21/2014 01:26 PM   Modules accepted: Orders

## 2014-08-22 ENCOUNTER — Telehealth: Payer: Self-pay | Admitting: Family Medicine

## 2014-08-22 NOTE — Telephone Encounter (Signed)
[facility] called because trazodone no longer comes in 300 mg so wanted to see if they could fill 150 mg 2 tabs hs #60. I gave them verbal ok to change

## 2014-08-26 ENCOUNTER — Other Ambulatory Visit: Payer: Self-pay

## 2014-08-26 MED ORDER — TRAZODONE HCL 300 MG PO TABS
300.0000 mg | ORAL_TABLET | Freq: Every day | ORAL | Status: DC
Start: 2014-08-26 — End: 2014-09-21

## 2014-08-26 NOTE — Telephone Encounter (Signed)
Pt reqd change to 90 day supplies. Resent.

## 2014-08-30 ENCOUNTER — Other Ambulatory Visit: Payer: Self-pay | Admitting: Internal Medicine

## 2014-08-31 ENCOUNTER — Other Ambulatory Visit: Payer: Self-pay | Admitting: Internal Medicine

## 2014-08-31 NOTE — Telephone Encounter (Signed)
Faxed

## 2014-09-09 ENCOUNTER — Telehealth: Payer: Self-pay

## 2014-09-09 ENCOUNTER — Other Ambulatory Visit: Payer: Self-pay | Admitting: Internal Medicine

## 2014-09-09 NOTE — Telephone Encounter (Signed)
Pt's traMADol (ULTRAM) 50 MG tablet [[id number]] was denied because pt needs an OV. Pt states that she has an Ov scheduled, and would like to know if he could fill her script up until that appt.

## 2014-09-09 NOTE — Telephone Encounter (Signed)
Whoops--thought req was for [employer] The tram has been written by dr Carlota Raspberry for a specific purpose --he requested f/u //he will need to decide on this refill

## 2014-09-09 NOTE — Telephone Encounter (Signed)
This med is not on her med list or past history in meds--we must not be prescriber Not used with trazadone cause both [employer] same thing

## 2014-09-12 NOTE — Telephone Encounter (Deleted)
I treated her in July for chest wall pain that was thought to be shingles, and instructed RTC if further meds needed on subsequent messages.  See 08/19/14 phone note "Also, she would like a refill on traMADol (ULTRAM) 50 MG tablet [[id number]] for her back ( Says Dr. Laney Pastor is the prescribing Dr. On this medication)"   It appears she may be calling for refill for

## 2014-09-12 NOTE — Telephone Encounter (Signed)
Dr. Carlota Raspberry please advise refill. I have pended medication.

## 2014-09-12 NOTE — Telephone Encounter (Addendum)
I treated her in July for chest wall pain that was thought to be shingles, and instructed RTC if further meds needed on subsequent messages.  See 08/19/14 phone note "Also, she would like a refill on traMADol (ULTRAM) 50 MG tablet [[id number]] for her back ( Says Dr. Laney Pastor is the prescribing Dr. On this medication)".   It appears she may be calling for refill of ultram for back pain - this was treated by Dr. Laney Pastor, so up to him if she needs OV prior to refill of this med. It appears she received #30 on 08/30/14.  If she is still taking for chest wall symptoms, I am unable to refill prior to office visit as noted on prior messages.

## 2014-09-12 NOTE — Addendum Note (Signed)
Addended by: Jethro Bolus A on: 09/12/2014 09:25 AM   Modules accepted: Orders

## 2014-09-12 NOTE — Addendum Note (Signed)
Addended by: Merri Ray R on: 09/12/2014 10:16 PM   Modules accepted: Orders

## 2014-09-13 ENCOUNTER — Other Ambulatory Visit: Payer: Self-pay | Admitting: *Deleted

## 2014-09-13 MED ORDER — CLONIDINE HCL 0.1 MG PO TABS: 0.1000 mg | ORAL_TABLET | Freq: Every day | ORAL | Status: DC

## 2014-09-13 NOTE — Telephone Encounter (Signed)
Received fax from pharmacy- pt needs #90 or they will not cover medication. Pt has an appt in Oct with Dr. Laney Pastor.

## 2014-09-14 MED ORDER — TRAMADOL HCL 50 MG PO TABS: ORAL_TABLET | ORAL | Status: DC

## 2014-09-14 NOTE — Telephone Encounter (Signed)
Meds ordered this encounter  Medications  . traMADol (ULTRAM) 50 MG tablet    Sig: TAKE 1 TO 2 TABLETS BY MOUTH EVERY 6 HOURS AS NEEDED    Dispense:  30 tablet    Refill:  0

## 2014-09-14 NOTE — Telephone Encounter (Signed)
Faxed rx to pharmacy  

## 2014-09-14 NOTE — Telephone Encounter (Signed)
Pt.notified

## 2014-09-15 ENCOUNTER — Ambulatory Visit (INDEPENDENT_AMBULATORY_CARE_PROVIDER_SITE_OTHER): Payer: BC Managed Care – PPO | Admitting: Physician Assistant

## 2014-09-15 ENCOUNTER — Telehealth: Payer: Self-pay

## 2014-09-15 VITALS — BP 116/80 | HR 106 | Temp 98.1°F | Resp 18 | Ht 69.25 in | Wt 191.8 lb

## 2014-09-15 DIAGNOSIS — R059 Cough, unspecified: Secondary | ICD-10-CM

## 2014-09-15 DIAGNOSIS — R509 Fever, unspecified: Secondary | ICD-10-CM

## 2014-09-15 DIAGNOSIS — N611 Abscess of the breast and nipple: Secondary | ICD-10-CM

## 2014-09-15 DIAGNOSIS — N61 Mastitis without abscess: Secondary | ICD-10-CM

## 2014-09-15 DIAGNOSIS — R05 Cough: Secondary | ICD-10-CM

## 2014-09-15 LAB — POCT CBC
Granulocyte percent: 71.1 %G (ref 37–80)
HCT, POC: 40.4 % (ref 37.7–47.9)
HEMOGLOBIN: 13.1 g/dL (ref 12.2–16.2)
LYMPH, POC: 1.7 (ref 0.6–3.4)
MCH, POC: 30.6 pg (ref 27–31.2)
MCHC: 32.6 g/dL (ref 31.8–35.4)
MCV: 94.2 fL (ref 80–97)
MID (cbc): 0.3 (ref 0–0.9)
MPV: 7.3 fL (ref 0–99.8)
POC Granulocyte: 4.9 (ref 2–6.9)
POC LYMPH %: 24.9 % (ref 10–50)
POC MID %: 4 % (ref 0–12)
Platelet Count, POC: 304 10*3/uL (ref 142–424)
RBC: 4.29 M/uL (ref 4.04–5.48)
RDW, POC: 14.6 %
WBC: 6.9 10*3/uL (ref 4.6–10.2)

## 2014-09-15 MED ORDER — HYDROCOD POLST-CHLORPHEN POLST 10-8 MG/5ML PO LQCR: 5.0000 mL | Freq: Two times a day (BID) | ORAL | Status: DC | PRN

## 2014-09-15 MED ORDER — SULFAMETHOXAZOLE-TRIMETHOPRIM 800-160 MG PO TABS: 1.0000 | ORAL_TABLET | Freq: Two times a day (BID) | ORAL | Status: DC

## 2014-09-15 NOTE — Telephone Encounter (Signed)
Patient requesting an order for a Diagnostic MMG to be sent to [facility] [phone]. Patient has been running a fever and felt like she had some type of virus the last couple of day. But today she felt a hardness area under her nipple on right breast. Per patient she pressed down on it and white discharge came out of the nipple. Patient called the breast center and told her she should have her pcp Dr. Laney Pastor send over an order to have it checked. Patient was suppose to have a regular MMG done this past march but states she never did. Patients call back number is [phone]

## 2014-09-15 NOTE — Progress Notes (Signed)
Subjective:    Patient ID: Patricia Newton, female    DOB: March 04, 1971, 43 y.o.   MRN: [id number]  HPI Pt presents to clinic with right breast pain for 24h and cold symptoms for about 2 days.  She has congestion and a dry cough that is worse at night with a slight sore throat in the am but today it has been better.  Last night she noticed that her right breast was tender and then this am the pain was worse and it has progressively gotten worse throughout the day.  She noticed this am a slight clear discharge from her nipple that got more milky as the day went on.  She had a mammogram years ago which was normal.  She has had fevers yesterday and chills that is controlled with Motrin.  She has some myalgias that seems to mimic the timing of the fever.  Both her daughters have been sick with colds.  She does not feel like her cold is as bad as she feels.  Review of Systems  Constitutional: Positive for fever. Negative for chills.  HENT: Positive for congestion, postnasal drip and sore throat (am only).   Respiratory: Positive for cough (dry Increased at night).        Objective:   Physical Exam  Constitutional: She is oriented to person, place, and time. She appears well-developed and well-nourished.  HENT:  Head: Normocephalic and atraumatic.  Right Ear: Hearing, tympanic membrane, external ear and ear canal normal.  Left Ear: Hearing, tympanic membrane, external ear and ear canal normal.  Nose: Mucosal edema (red) present.  Mouth/Throat: Uvula is midline, oropharynx is clear and moist and mucous membranes are normal.  Eyes: Conjunctivae are normal.  Neck: Normal range of motion.  Cardiovascular: Normal rate, regular rhythm and normal heart sounds.   No murmur heard. Pulmonary/Chest: Effort normal and breath sounds normal. She has no wheezes.  Genitourinary: There is breast swelling, tenderness and discharge.  Right breast - slight erythema just inferior to the breast on the outer lower  portion near the areola.  She has TTP and induration about 3x4 cm at the 6-7o'clock position - that is TTP.  When palpated purulent yellow/green material is expressed that was sent for culture.  Lymphadenopathy:       Head (right side): Submandibular adenopathy present. No preauricular and no occipital adenopathy present.       Head (left side): Submandibular adenopathy present. No preauricular and no occipital adenopathy present.    She has cervical adenopathy.       Right cervical: Superficial cervical adenopathy present.    She has no axillary adenopathy.       Right: No supraclavicular adenopathy present.       Left: No supraclavicular adenopathy present.  Neurological: She is alert and oriented to person, place, and time.  Skin: Skin is warm and dry.  Psychiatric: She has a normal mood and affect. Her behavior is normal. Judgment and thought content normal.   Results for orders placed in visit on 09/15/14  POCT CBC      Result Value Ref Range   WBC 6.9  4.6 - 10.2 K/uL   Lymph, poc 1.7  0.6 - 3.4   POC LYMPH PERCENT 24.9  10 - 50 %L   MID (cbc) 0.3  0 - 0.9   POC MID % 4.0  0 - 12 %M   POC Granulocyte 4.9  2 - 6.9   Granulocyte percent 71.1  37 - 80 %  G   RBC 4.29  4.04 - 5.48 M/uL   Hemoglobin 13.1  12.2 - 16.2 g/dL   HCT, POC 40.4  37.7 - 47.9 %   MCV 94.2  80 - 97 fL   MCH, POC 30.6  27 - 31.2 pg   MCHC 32.6  31.8 - 35.4 g/dL   RDW, POC 14.6     Platelet Count, POC 304  142 - 424 K/uL   MPV 7.3  0 - 99.8 fL        Assessment & Plan:  Abscess of breast, right - Plan: POCT CBC, Wound culture, sulfamethoxazole-trimethoprim (SEPTRA DS) 800-160 MG per tablet, US BREAST COMPLETE UNI LEFT INC AXILLA, MM Digital Diagnostic Bilat (the be done tomorrow am due to no radiologist being at the facility for a mammogram now.)  She will use warm compresses tonight and tomorrow prior to her appt.  Fever, unspecified - Plan: POCT CBC  Cough - Plan: chlorpheniramine-HYDROcodone Amanda Cockayne  PENNKINETIC ER) 10-8 MG/5ML Gaspar Skeeters  Windell Hummingbird PA-C  [facility] 09/15/2014 6:28 PM

## 2014-09-15 NOTE — Telephone Encounter (Signed)
Patient called back stated per the [facility] they would need the order today in order to get the patient in by tomorrow. Patient is very concern and request if our office could please send the order asap. Patient call back number [phone]

## 2014-09-15 NOTE — Telephone Encounter (Signed)
Advised pt she needs an OV before a diagnostic MM referral can be made.

## 2014-09-16 ENCOUNTER — Other Ambulatory Visit: Payer: Self-pay | Admitting: Physician Assistant

## 2014-09-16 ENCOUNTER — Ambulatory Visit
Admission: RE | Admit: 2014-09-16 | Discharge: 2014-09-16 | Disposition: A | Discharge status: Home / Self Care | Payer: BC Managed Care – PPO | Source: Ambulatory Visit | Attending: Physician Assistant | Admitting: Physician Assistant

## 2014-09-16 ENCOUNTER — Other Ambulatory Visit: Payer: Self-pay

## 2014-09-16 ENCOUNTER — Other Ambulatory Visit: Payer: Self-pay | Admitting: Internal Medicine

## 2014-09-16 ENCOUNTER — Telehealth: Payer: Self-pay | Admitting: *Deleted

## 2014-09-16 DIAGNOSIS — N611 Abscess of the breast and nipple: Secondary | ICD-10-CM

## 2014-09-16 NOTE — Telephone Encounter (Signed)
She can take 2 Ultram every 6 hours.  Please let the patient know that the cough medication I gave her has the same active ingredient as Norco.

## 2014-09-16 NOTE — Telephone Encounter (Signed)
Pt is requesting something stronger for pain. She has Tramadol and that is not working. She had an allergic reaction to Norco. Please advise.

## 2014-09-16 NOTE — Telephone Encounter (Signed)
[facility] called and needs the order for the aspiration signed. It has been sent to [facility].

## 2014-09-16 NOTE — Telephone Encounter (Signed)
Pt of [facility] see for breast infection, received a caugh medicine to take every 12 hours, cough medicine worked for pain and cough, would like to know if she can use this as a pine medicine, or if there is something else she could use?

## 2014-09-16 NOTE — Telephone Encounter (Signed)
She can use it every 12h for either the cough or the pain.

## 2014-09-17 ENCOUNTER — Ambulatory Visit (INDEPENDENT_AMBULATORY_CARE_PROVIDER_SITE_OTHER): Payer: BC Managed Care – PPO | Admitting: Physician Assistant

## 2014-09-17 VITALS — BP 104/62 | HR 103 | Temp 98.3°F | Resp 18 | Ht 69.0 in | Wt 189.8 lb

## 2014-09-17 DIAGNOSIS — N61 Mastitis without abscess: Secondary | ICD-10-CM

## 2014-09-17 DIAGNOSIS — N644 Mastodynia: Secondary | ICD-10-CM

## 2014-09-17 DIAGNOSIS — N611 Abscess of the breast and nipple: Secondary | ICD-10-CM

## 2014-09-17 LAB — POCT CBC
GRANULOCYTE PERCENT: 82.1 % — AB (ref 37–80)
HEMATOCRIT: 40.3 % (ref 37.7–47.9)
Hemoglobin: 13 g/dL (ref 12.2–16.2)
Lymph, poc: 1 (ref 0.6–3.4)
MCH, POC: 30.5 pg (ref 27–31.2)
MCHC: 32.3 g/dL (ref 31.8–35.4)
MCV: 94.5 fL (ref 80–97)
MID (cbc): 0.4 (ref 0–0.9)
MPV: 7.2 fL (ref 0–99.8)
PLATELET COUNT, POC: 281 10*3/uL (ref 142–424)
POC Granulocyte: 6.2 (ref 2–6.9)
POC LYMPH PERCENT: 12.8 %L (ref 10–50)
POC MID %: 5.1 %M (ref 0–12)
RBC: 4.27 M/uL (ref 4.04–5.48)
RDW, POC: 14.4 %
WBC: 7.6 10*3/uL (ref 4.6–10.2)

## 2014-09-17 MED ORDER — OXYCODONE-ACETAMINOPHEN 5-325 MG PO TABS: 1.0000 | ORAL_TABLET | Freq: Three times a day (TID) | ORAL | Status: DC | PRN

## 2014-09-17 MED ORDER — CLINDAMYCIN HCL 300 MG PO CAPS: 300.0000 mg | ORAL_CAPSULE | Freq: Four times a day (QID) | ORAL | Status: DC

## 2014-09-17 NOTE — Progress Notes (Signed)
Subjective:    Patient ID: Patricia Newton, female    DOB: 1971-01-11, 43 y.o.   MRN: [id number]  HPI Patient presents for follow up two days later for breast abscess that is more swollen and painful. She states that she has been compliant with her antibiotic which is well tolerated. Also using a heating pad and ibuprofen which have not helped. She wears a bra for support, but says her breast still feels heavy/full.   Has additional URI symptoms that are being treated symptomatically.   Review of Systems  Constitutional: Negative for fever, chills and fatigue.  HENT: Positive for congestion, ear discharge, ear pain, rhinorrhea, sneezing and sore throat.   Respiratory: Positive for cough. Negative for chest tightness, shortness of breath and wheezing.   Cardiovascular: Negative for chest pain.  Gastrointestinal: Negative for nausea, vomiting and abdominal pain.  Skin: Negative for color change and rash.       Nipple able to express pus. R breast more swollen and painful.       Objective:   Physical Exam  Constitutional: She is oriented to person, place, and time. She appears well-developed and well-nourished. No distress.  HENT:  Head: Normocephalic and atraumatic.  Right Ear: External ear normal.  Left Ear: External ear normal.  Neck: Neck supple. No thyromegaly present.  Cardiovascular: Normal rate, regular rhythm and normal heart sounds.  Exam reveals no gallop and no friction rub.   No murmur heard. Pulmonary/Chest: Effort normal and breath sounds normal. She has no wheezes. She has no rales.  Abdominal: Soft. Bowel sounds are normal. She exhibits no distension and no mass. There is no tenderness.  Genitourinary: There is breast swelling (with erythema), tenderness and discharge (pus expressed from nipple). No breast bleeding.  Lymphadenopathy:    She has no cervical adenopathy.  Neurological: She is alert and oriented to person, place, and time.  Skin: Skin is warm and  dry. She is not diaphoretic. There is erythema.   Blood pressure 104/62, pulse 103, temperature 98.3 F (36.8 C), temperature source Oral, resp. rate 18, height 5\' 9"  (1.753 m), weight 189 lb 12.8 oz (86.093 kg), last menstrual period 09/04/2014, SpO2 99.00%.  CBC    Component Value Date/Time   WBC 7.6 09/17/2014 1638   WBC 9.2 03/02/2014 1556   RBC 4.27 09/17/2014 1638   RBC 4.91 03/02/2014 1556   HGB 13.0 09/17/2014 1638   HGB 14.8 03/02/2014 1556   HCT 40.3 09/17/2014 1638   HCT 44.3 03/02/2014 1556   PLT 418* 03/02/2014 1556   MCV 94.5 09/17/2014 1638   MCV 90.2 03/02/2014 1556   MCH 30.5 09/17/2014 1638   MCH 30.1 03/02/2014 1556   MCHC 32.3 09/17/2014 1638   MCHC 33.4 03/02/2014 1556   RDW 13.8 03/02/2014 1556   LYMPHSABS 1.9 03/02/2014 1556   MONOABS 0.5 03/02/2014 1556   EOSABS 0.1 03/02/2014 1556   BASOSABS 0.0 03/02/2014 1556    BREAST ULTRASOUND: IMPRESSION per Curlene Dolphin, MD:  Clinical imaging findings consistent with mastitis involving the  inferior periareolar right breast. There is some complex fluid  within some of the subareolar ducts that may be pus within the  ducts. The patient's questions were answered. It was discussed with  the patient today that while abscess is not present currently, one  could potentially develop, and she should contact her physician if  she does not experience clinical improvement with antibiotic  therapy. She has follow-up with her physician tomorrow. It is also  discussed with the patient that smoking is associated with the  increased rates of subareolar periductal mastitis. Smoking cessation  should be considered.      Assessment & Plan:   1. Abscess of breast, right/ breast pain Switching antibiotic from Septra to Cleocin due to infection of breast worsening and increase in granulocytes% from 71.1 to 82.1. - POCT CBC - clindamycin (CLEOCIN) 300 MG capsule; Take 1 capsule (300 mg total) by mouth 4 (four) times daily.  Dispense: 40  capsule; Refill: 0 - oxyCODONE-acetaminophen (ROXICET) 5-325 MG per tablet; Take 1 tablet by mouth every 8 (eight) hours as needed for severe pain.  Dispense: 20 tablet; Refill: 0 - Encouraged to manually express pus from breast or use breast pump and increase use of heating pad. - Return for follow up on Tuesday 09/20/14 unless pain is worse.

## 2014-09-17 NOTE — Patient Instructions (Signed)
1. Your antibiotic has been switched.  2. You will need to manually express pus from breast or use a breast pump to express pus. 3. Increase heating pad use.

## 2014-09-17 NOTE — Telephone Encounter (Signed)
Patient notified and voiced understanding.

## 2014-09-18 LAB — WOUND CULTURE
Gram Stain: NONE SEEN
Gram Stain: NONE SEEN
Organism ID, Bacteria: NO GROWTH

## 2014-09-18 NOTE — Progress Notes (Signed)
I was directly involved with the patient's care and agree with the physical, diagnosis and treatment plan.  

## 2014-09-20 ENCOUNTER — Encounter: Payer: Self-pay | Admitting: Physician Assistant

## 2014-09-20 ENCOUNTER — Ambulatory Visit (INDEPENDENT_AMBULATORY_CARE_PROVIDER_SITE_OTHER): Payer: BC Managed Care – PPO | Admitting: Physician Assistant

## 2014-09-20 ENCOUNTER — Telehealth: Payer: Self-pay

## 2014-09-20 VITALS — BP 112/70 | HR 99 | Temp 98.7°F | Resp 18 | Ht 69.0 in | Wt 189.0 lb

## 2014-09-20 DIAGNOSIS — N61 Mastitis without abscess: Secondary | ICD-10-CM

## 2014-09-20 LAB — POCT CBC
Granulocyte percent: 67.9 %G (ref 37–80)
HEMATOCRIT: 43.6 % (ref 37.7–47.9)
Hemoglobin: 14.5 g/dL (ref 12.2–16.2)
LYMPH, POC: 1.6 (ref 0.6–3.4)
MCH: 30.8 pg (ref 27–31.2)
MCHC: 33.3 g/dL (ref 31.8–35.4)
MCV: 92.6 fL (ref 80–97)
MID (cbc): 0.4 (ref 0–0.9)
MPV: 7.2 fL (ref 0–99.8)
POC Granulocyte: 4.2 (ref 2–6.9)
POC LYMPH %: 26.4 % (ref 10–50)
POC MID %: 5.7 % (ref 0–12)
Platelet Count, POC: 493 10*3/uL — AB (ref 142–424)
RBC: 4.71 M/uL (ref 4.04–5.48)
RDW, POC: 14.7 %
WBC: 6.2 10*3/uL (ref 4.6–10.2)

## 2014-09-20 MED ORDER — TRAMADOL HCL 50 MG PO TABS: ORAL_TABLET | ORAL | Status: DC

## 2014-09-20 NOTE — Progress Notes (Signed)
   Subjective:    Patient ID: Patricia Newton, female    DOB: February 09, 1971, 43 y.o.   MRN: [id number]  HPI Pt presents to clinic for recheck mastitis. She feels like the area in her breast is not as hard - she has been manually expressing and still getting pus out but she is getting less pus out.  Her pain has improved.  She started only taking her Clindamycin tid because of side effects - she is tolerating it ok.  She is also having problems with constipation - she has not had a BM in a week but she has also not been eating a large amount of food because of her decreased appetite.  She has used a glycerin suppository but it is not helping that much - she has tried miralax but does not feel like it is helping.  Review of Systems  Constitutional: Negative for fever and chills.       Objective:   Physical Exam  Vitals reviewed. Constitutional: She is oriented to person, place, and time. She appears well-developed and well-nourished.  HENT:  Head: Normocephalic and atraumatic.  Right Ear: External ear normal.  Left Ear: External ear normal.  Pulmonary/Chest: Effort normal.  Genitourinary: There is breast tenderness and discharge (yellow purulence). No breast swelling. Pelvic exam was performed with patient supine.  Right breast area is not as indurated but the area that is indurated is smaller - no erythema or warmth on skin -    Neurological: She is alert and oriented to person, place, and time.  Skin: Skin is warm and dry.  Psychiatric: She has a normal mood and affect. Her behavior is normal. Judgment and thought content normal.       Assessment & Plan:  Acute mastitis of right breast - Plan: POCT CBC Constipation -  Continue abx. Warm compresses -   Windell Hummingbird PA-C  [facility] 09/20/2014 11:57 AM

## 2014-09-20 NOTE — Telephone Encounter (Signed)
Ready

## 2014-09-20 NOTE — Telephone Encounter (Signed)
Patient saw Windell Hummingbird this am and forgot to tell her she needs a refill on her Tramadol   Best phone for pt is [phone]  Pharmacy [phone]

## 2014-09-20 NOTE — Patient Instructions (Signed)
Take 6 doses of Miralax within 2 hours today - if you do not have a bowel movement today repeat this again tomorrow - if you do have a BM today - use the Miralax 2x.day for the next week or so to regulate Continue to take the colace 400mg  daily until you have regular soft stools

## 2014-09-21 ENCOUNTER — Telehealth: Payer: Self-pay

## 2014-09-21 MED ORDER — TRAZODONE HCL 150 MG PO TABS: 300.0000 mg | ORAL_TABLET | Freq: Every day | ORAL | Status: DC

## 2014-09-21 MED ORDER — CLONIDINE HCL 0.1 MG PO TABS: 0.1000 mg | ORAL_TABLET | Freq: Every day | ORAL | Status: DC

## 2014-09-21 NOTE — Telephone Encounter (Signed)
Pt called and stated that two of her Rxs need to be sent back in w/corrections. Her clonidine is written for 1-2 tablets Qhs and she has been taking 2 Qhs. The quantity sent was only enough for 1 Qhs. Also pharm has been unable to get the 300 mg Trazodone and she has been taking 150 mg, 2 tabs Qhs. She needs this to be changed. Dr Laney Pastor had recently written Rxs for 6 mos and I am sending in corrected Rxs for 90 day supplies (per ins).

## 2014-09-21 NOTE — Telephone Encounter (Signed)
Called in and pt notified on VM.

## 2014-09-26 ENCOUNTER — Other Ambulatory Visit: Payer: BC Managed Care – PPO

## 2014-09-26 ENCOUNTER — Other Ambulatory Visit: Payer: Self-pay | Admitting: Physician Assistant

## 2014-09-27 ENCOUNTER — Telehealth: Payer: Self-pay | Admitting: Physician Assistant

## 2014-09-27 ENCOUNTER — Other Ambulatory Visit: Payer: Self-pay | Admitting: Physician Assistant

## 2014-09-27 ENCOUNTER — Ambulatory Visit
Admission: RE | Admit: 2014-09-27 | Discharge: 2014-09-27 | Disposition: A | Discharge status: Home / Self Care | Payer: BC Managed Care – PPO | Source: Ambulatory Visit | Attending: Physician Assistant | Admitting: Physician Assistant

## 2014-09-27 DIAGNOSIS — N611 Abscess of the breast and nipple: Secondary | ICD-10-CM

## 2014-09-27 NOTE — Telephone Encounter (Signed)
Pt has an appt for that [place] 2 wks from today. She has an appt with Dr. Laney Pastor tomorrow that she is going to keep and do a f/u because she has a low grade temp and is not feeling well. The radiologist said that there is still some pus in her wound and that she is still expressing some too.

## 2014-09-27 NOTE — Telephone Encounter (Signed)
Please call and check on the patient.  It looks like we need to repeat her [place] on her breast in 2 weeks.  Please let me know if I need to order than or if she was told that the breast center would order it for her.

## 2014-09-28 ENCOUNTER — Encounter: Payer: Self-pay | Admitting: Internal Medicine

## 2014-09-28 ENCOUNTER — Ambulatory Visit (INDEPENDENT_AMBULATORY_CARE_PROVIDER_SITE_OTHER): Payer: BC Managed Care – PPO | Admitting: Internal Medicine

## 2014-09-28 VITALS — BP 120/70 | HR 104 | Temp 100.0°F | Resp 16 | Ht 69.0 in | Wt 183.0 lb

## 2014-09-28 DIAGNOSIS — N61 Inflammatory disorders of breast: Secondary | ICD-10-CM

## 2014-09-28 DIAGNOSIS — R059 Cough, unspecified: Secondary | ICD-10-CM

## 2014-09-28 DIAGNOSIS — R5081 Fever presenting with conditions classified elsewhere: Secondary | ICD-10-CM

## 2014-09-28 DIAGNOSIS — N611 Abscess of the breast and nipple: Secondary | ICD-10-CM

## 2014-09-28 DIAGNOSIS — R05 Cough: Secondary | ICD-10-CM

## 2014-09-28 MED ORDER — HYDROCOD POLST-CHLORPHEN POLST 10-8 MG/5ML PO LQCR
5.0000 mL | Freq: Two times a day (BID) | ORAL | Status: DC | PRN
Start: 2014-09-28 — End: 2014-12-08

## 2014-09-28 MED ORDER — CYCLOBENZAPRINE HCL 10 MG PO TABS: ORAL_TABLET | ORAL | Status: DC

## 2014-09-28 MED ORDER — TRAMADOL HCL 50 MG PO TABS: ORAL_TABLET | ORAL | Status: DC

## 2014-09-28 MED ORDER — DOXYCYCLINE HYCLATE 100 MG PO TABS: 100.0000 mg | ORAL_TABLET | Freq: Two times a day (BID) | ORAL | Status: DC

## 2014-09-28 NOTE — Patient Instructions (Signed)
Dr. Estill Batten Dr. [facility]. [place]

## 2014-09-28 NOTE — Progress Notes (Signed)
   Subjective:    Patient ID: Dessire Grimes, female    DOB: 06-08-1971, 43 y.o.   MRN: [id number]  HPIdiarrhea w/ clinda Fever 2 days Breast more tender Also resp sxtoms--several days--occas productive Back pain-long hx--patient care on thurs and fri always seems to make this worse  Seems muscular as in past and hasn't been able to afford PT  Flex   Review of Systems  Constitutional: Negative for fatigue and unexpected weight change.  Eyes: Negative for visual disturbance.  Respiratory: Negative for cough and chest tightness.   Cardiovascular: Negative for chest pain, palpitations and leg swelling.  Gastrointestinal: Negative for abdominal pain.  Genitourinary: Negative for dysuria, frequency and difficulty urinating.  Musculoskeletal: Negative for joint swelling and neck pain.  Skin: Negative for rash.  Neurological: Negative for headaches.  Psychiatric/Behavioral: Negative for sleep disturbance.       Objective:   Physical Exam BP 120/70  Pulse 104  Temp(Src) 100 F (37.8 C) (Oral)  Resp 16  Ht 5\' 9"  (1.753 m)  Wt 183 lb (83.008 kg)  BMI 27.01 kg/m2  SpO2 100%  LMP 09/04/2014 HEENT cl x boggy turbs No nodes Chest clear R breast w/ 2cm firmness inf to nipple with pus expressable from ducts Not red or swollen/is tender SLR to 90 bilat-no sens loss/dtrs symm      culture Assessment & Plan:  Abscess of breast, right - Plan: Wound culture  Cough  Fever presenting with conditions classified elsewhere   Meds ordered this encounter  Medications  . cyclobenzaprine (FLEXERIL) 10 MG tablet    Sig: TAKE 1 TABLET BY MOUTH AT BEDTIME    Dispense:  30 tablet    Refill:  5  . chlorpheniramine-HYDROcodone (TUSSIONEX PENNKINETIC ER) 10-8 MG/5ML LQCR    Sig: Take 5 mLs by mouth every 12 (twelve) hours as needed for cough.    Dispense:  115 mL    Refill:  0  . doxycycline (VIBRA-TABS) 100 MG tablet    Sig: Take 1 tablet (100 mg total) by mouth 2 (two) times daily.      Dispense:  20 tablet    Refill:  0  . traMADol (ULTRAM) 50 MG tablet    Sig: TAKE 1 TO 2 TABLETS BY MOUTH EVERY 6 HOURS AS NEEDED    Dispense:  30 tablet    Refill:  0   Awaiting cult//reimage 2 weeks

## 2014-10-01 ENCOUNTER — Encounter: Payer: Self-pay | Admitting: Internal Medicine

## 2014-10-01 LAB — WOUND CULTURE
GRAM STAIN: NONE SEEN
GRAM STAIN: NONE SEEN
Gram Stain: NONE SEEN

## 2014-10-02 ENCOUNTER — Telehealth: Payer: Self-pay

## 2014-10-02 NOTE — Telephone Encounter (Signed)
PATIENT STATES DR. Laney Pastor DID A CULTURE OF HER (R) BREAST ON Wednesday. HE TOLD HER THE RESULTS WOULD BE BACK ON Saturday, BUT SHE NEVER HEARD BACK FROM [place]. SHE WOULD LIKE TO GET THOSE RESULTS PLEASE. BEST PHONE [phone] (CELL)  [facility] [address]  [place]

## 2014-10-04 ENCOUNTER — Other Ambulatory Visit: Payer: Self-pay | Admitting: Internal Medicine

## 2014-10-05 ENCOUNTER — Telehealth: Payer: Self-pay

## 2014-10-05 ENCOUNTER — Other Ambulatory Visit: Payer: Self-pay | Admitting: Internal Medicine

## 2014-10-05 MED ORDER — TRAMADOL HCL 50 MG PO TABS: ORAL_TABLET | ORAL | Status: DC

## 2014-10-05 NOTE — Telephone Encounter (Signed)
Pt in need of her Tramadol 50mg s. Please call [phone]

## 2014-10-05 NOTE — Telephone Encounter (Signed)
Faxed

## 2014-10-06 ENCOUNTER — Telehealth: Payer: Self-pay

## 2014-10-06 NOTE — Telephone Encounter (Signed)
Pt is calling regarding the status of her refill request for tramadol Please call and advise

## 2014-10-06 NOTE — Telephone Encounter (Signed)
Pt is needing refill on doxycycline (VIBRA-TABS) 100 MG tablet

## 2014-10-06 NOTE — Telephone Encounter (Signed)
Pt called back in and said that her pharmacy has not received the Tramadol fax

## 2014-10-06 NOTE — Telephone Encounter (Signed)
She still has infection in breast, it is still tender, and will refer to breast surgeon if infection persists.  She will need a refill on her antibiotic to remain on it until her exam as stated at her OV. Please advise

## 2014-10-07 NOTE — Telephone Encounter (Signed)
Previous phone message is not clear.  Chart reviewed. If she needs a refill of the antibiotic, she needs re-evaluation. Tramadol Rx faxed 10/05/2014.

## 2014-10-11 ENCOUNTER — Ambulatory Visit
Admission: RE | Admit: 2014-10-11 | Discharge: 2014-10-11 | Disposition: A | Discharge status: Home / Self Care | Payer: BC Managed Care – PPO | Source: Ambulatory Visit | Attending: Physician Assistant | Admitting: Physician Assistant

## 2014-10-11 ENCOUNTER — Other Ambulatory Visit: Payer: BC Managed Care – PPO

## 2014-10-11 DIAGNOSIS — N611 Abscess of the breast and nipple: Secondary | ICD-10-CM

## 2014-10-11 NOTE — Telephone Encounter (Signed)
Pt has f/up US breast today. I don't see that Dr Laney Pastor wanted pt to remain on Abx until next [place]. Abx was Rxd for 10 days, and [place] scheduled for 2 weeks. Dr Laney Pastor, please advise if I can do anything to help after you get [place] results back.

## 2014-10-11 NOTE — Telephone Encounter (Signed)
This was refaxed, see notes on duplicate message.

## 2014-10-12 NOTE — Telephone Encounter (Signed)
Call her---Ultrasnd shows resolution of abscess---stop trying to express any fluid for 1-2 weeks to see if mass returns!!!!

## 2014-10-13 NOTE — Telephone Encounter (Signed)
LMOM for pt to CB.  

## 2014-10-18 ENCOUNTER — Other Ambulatory Visit: Payer: Self-pay | Admitting: Internal Medicine

## 2014-10-19 NOTE — Telephone Encounter (Signed)
Faxed to pharm 

## 2014-10-24 ENCOUNTER — Telehealth: Payer: Self-pay

## 2014-10-24 MED ORDER — TRAMADOL HCL 50 MG PO TABS: ORAL_TABLET | ORAL | Status: DC

## 2014-10-24 NOTE — Telephone Encounter (Deleted)
Patient informed [facility]am[facility] [address]

## 2014-10-24 NOTE — Telephone Encounter (Signed)
Patient informed [facility]am[facility] [address].
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
## 2014-10-24 NOTE — Telephone Encounter (Signed)
Meds ordered this encounter  Medications  . traMADol (ULTRAM) 50 MG tablet    Sig: TAKE 1 TABLET BY MOUTH EVERY 4 TO 6 HOURS AS NEEDED    Dispense:  30 tablet    Refill:  2  expect pain to resolve soon

## 2014-10-28 NOTE — Telephone Encounter (Signed)
Unable to be find if this has been faxed/called. Called pt and she did get this rx.

## 2014-10-31 NOTE — Telephone Encounter (Signed)
Pt picked up script on 11/8.

## 2014-11-06 ENCOUNTER — Other Ambulatory Visit: Payer: Self-pay

## 2014-11-06 NOTE — Telephone Encounter (Signed)
Patient is requesting medication refill to tramadol sent to [facility]. Patient also wants Dr. Laney Pastor that her next appointment is in regards to pains she's been having.

## 2014-11-07 MED ORDER — TRAMADOL HCL 50 MG PO TABS: ORAL_TABLET | ORAL | Status: DC

## 2014-11-07 NOTE — Telephone Encounter (Signed)
Faxed. Pt notified 

## 2014-11-22 ENCOUNTER — Telehealth: Payer: Self-pay

## 2014-11-22 MED ORDER — TRAMADOL HCL 50 MG PO TABS: ORAL_TABLET | ORAL | Status: DC

## 2014-11-22 NOTE — Telephone Encounter (Signed)
Pt states she is in need of her TRAMADOL 50mg s, made an appt for next week with him. Please call [phone]

## 2014-11-23 ENCOUNTER — Other Ambulatory Visit: Payer: Self-pay

## 2014-11-23 NOTE — Telephone Encounter (Signed)
Phoned in rx. 

## 2014-11-23 NOTE — Telephone Encounter (Signed)
Please call patient when medication refill request for tramadol is approved. Thank you! She was calling to check status of it  [phone]

## 2014-11-30 ENCOUNTER — Encounter: Payer: Self-pay | Admitting: Internal Medicine

## 2014-11-30 ENCOUNTER — Ambulatory Visit (INDEPENDENT_AMBULATORY_CARE_PROVIDER_SITE_OTHER): Payer: BC Managed Care – PPO | Admitting: Internal Medicine

## 2014-11-30 VITALS — BP 140/86 | HR 123 | Temp 98.0°F | Resp 16 | Ht 68.75 in | Wt 186.6 lb

## 2014-11-30 DIAGNOSIS — R5382 Chronic fatigue, unspecified: Secondary | ICD-10-CM

## 2014-11-30 DIAGNOSIS — M545 Low back pain, unspecified: Secondary | ICD-10-CM

## 2014-11-30 DIAGNOSIS — M791 Myalgia: Secondary | ICD-10-CM

## 2014-11-30 DIAGNOSIS — M609 Myositis, unspecified: Secondary | ICD-10-CM

## 2014-11-30 DIAGNOSIS — IMO0001 Reserved for inherently not codable concepts without codable children: Secondary | ICD-10-CM

## 2014-11-30 LAB — COMPREHENSIVE METABOLIC PANEL
ALT: 12 U/L (ref 0–35)
AST: 13 U/L (ref 0–37)
Albumin: 4.6 g/dL (ref 3.5–5.2)
Alkaline Phosphatase: 36 U/L — ABNORMAL LOW (ref 39–117)
BUN: 12 mg/dL (ref 6–23)
CO2: 22 meq/L (ref 19–32)
CREATININE: 0.72 mg/dL (ref 0.50–1.10)
Calcium: 9.7 mg/dL (ref 8.4–10.5)
Chloride: 105 mEq/L (ref 96–112)
Glucose, Bld: 88 mg/dL (ref 70–99)
Potassium: 4.2 mEq/L (ref 3.5–5.3)
Sodium: 140 mEq/L (ref 135–145)
Total Bilirubin: 0.3 mg/dL (ref 0.2–1.2)
Total Protein: 7.5 g/dL (ref 6.0–8.3)

## 2014-11-30 LAB — CK: Total CK: 70 U/L (ref 7–177)

## 2014-11-30 MED ORDER — CYCLOBENZAPRINE HCL 10 MG PO TABS: ORAL_TABLET | ORAL | Status: DC

## 2014-11-30 MED ORDER — TRAMADOL HCL 50 MG PO TABS: ORAL_TABLET | ORAL | Status: DC

## 2014-11-30 NOTE — Progress Notes (Signed)
Subjective:    Patient ID: Patricia Newton, female    DOB: [birth year], 43 y.o.   MRN: [id number]  Back Pain Pertinent negatives include no abdominal pain or chest pain.   Patricia Newton is here today for a recheck of her back pain.  Her back pain started during her pregnancy with her second child 9 yrs ago with significant mid-back pain causing her to present to the ED several times for c/f labor.  She also had significant back labor when she delivered.  She has had intermittent mid back pain since then.  The pain spread to her lower back over time and was intermittent until a year ago.  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  She does cooking, cleaning and often lifts her patients in and out of bed.  She is on her feet all day at her job.  She started working 2 days a week and increased to 3-4 days a week a month ago, and the pain has continued to worsen since then.  She used to be able to take tramadol and flexeril prn and feel better for a week, but now her back stays sore and she is using up to 3 flexeril a day at least 3 times a week and is taking 2 tramadol TID scheduled.  She has tried doing stretches to help her pain, but states that these are more painful and not significantly helpful.  She also tried advil 83m TID for 2 weeks and did not find any relief with that.  She uses a heating pad for her pain and this is very helpful.  She states that her pain is so bad that she lays down with the heating pad when she gets off work and on her days off, and does little housework.  This is becoming a point of frustration between her and her husband.  She states her insurance does not cover a chiropractor and minimally covers PT so she has not tried these.   She also endorses having frequent episodes of morning soreness that she notices as soon a she wakes up and that slowly improves over 2-3 days.  It is in  her BUE and BLE, worst in her thighs and forearms.  It feels "like soreness with the flu" or "what you would have after working out at the gym."  Her muscles are tender to the touch and sore, but not weak.  The soreness lasts all day and nothing makes it significantly better, but it often resolves on its own after a few days.  Does not always occur after working the day prior.  She denies fevers, night sweats or wt loss.  No family h/o autoimmune diseases.    Breast abscess has cleared up, no pain, swelling, erythema or other issues since         Review of Systems  Respiratory: Negative for cough and shortness of breath.   Cardiovascular: Negative for chest pain.  Gastrointestinal: Negative for abdominal pain.  Musculoskeletal: Positive for myalgias, back pain and arthralgias. Negative for joint swelling.  Psychiatric/Behavioral: Negative for confusion.       Objective:   Physical Exam  Constitutional: She is oriented to person, place, and time. She appears well-developed and well-nourished. No distress.  HENT:  Head: Normocephalic and atraumatic.  Eyes: Conjunctivae and EOM are normal.  Neck: Neck supple.  Cardiovascular: Regular rhythm, S1 normal and S2  normal.   Pulmonary/Chest: Effort normal and breath sounds normal. No respiratory distress. She has no wheezes. She has no rales.  Abdominal: Soft.  Musculoskeletal:  No tenderness to palpation of thoracic or lumbar spine.  Patellar and achilles reflexes intact and 2+ bilaterally.  Tender to palpation and significant paraspinal muscle tightness on the right at lower thoracic/upper lumbar region.  Mild tenderness to palpation and palpable tight paraspinal muscles in lumbar region bilaterally.  Negative straight leg raise.  Decreased ROM of bilateral shoulders and hips.       Neurological: She is alert and oriented to person, place, and time. She displays normal reflexes. She exhibits normal muscle tone.  Skin: Skin is warm and dry. No rash  noted.  Psychiatric: She has a normal mood and affect.   BP 140/86 mmHg  Pulse 123  Temp(Src) 98 F (36.7 C) (Oral)  Resp 16  Ht 5' 8.75" (1.746 m)  Wt 186 lb 9.6 oz (84.641 kg)  BMI 27.76 kg/m2  SpO2 99%  LMP 11/09/2014  Wt Readings from Last 3 Encounters:  11/30/14 186 lb 9.6 oz (84.641 kg)  09/28/14 183 lb (83.008 kg)  09/20/14 189 lb (85.73 kg)       Assessment & Plan:   Low back pain -Most likely myofascial pain that started with pregnancy  9 yrs ago and has continued to worsen with deconditioning and weakness of abdominal muscles and worsening weakness and strain of low back muscles -Explained typical time course and expectations -Recommend [facility] for at least a few visits.  Will send in referral -Recommend beginner triad yoga.  Start with 1-2x a week and cont to build up as tolerated -Recommend daily mobic and tramadol q4-6 hrs prn and nightly flexeril  Muscle soreness -Worse in the am and improves over 2-3 days.  No weakness -Will obtain ESR, ANA, CMP and CPK to r/o autoimmune or inflammatory etiology and electrolyte deficiency  -If lab work negative, likely soreness is 2/2 deconditioning   Meds ordered this encounter  Medications  . cyclobenzaprine (FLEXERIL) 10 MG tablet    Sig: TAKE 1 TABLET BY MOUTH AT BEDTIME    Dispense:  90 tablet    Refill:  5  . traMADol (ULTRAM) 50 MG tablet    Sig: TAKE 1 TABLET BY MOUTH EVERY 4 TO 6 HOURS AS NEEDED    Dispense:  120 tablet    Refill:  5   Addendum laboratory Results for orders placed or performed in visit on 11/30/14  CK  Result Value Ref Range   Total CK 70 7 - 177 U/L  Sedimentation rate  Result Value Ref Range   Sed Rate 4 0 - 22 mm/hr  Comprehensive metabolic panel  Result Value Ref Range   Sodium 140 135 - 145 mEq/L   Potassium 4.2 3.5 - 5.3 mEq/L   Chloride 105 96 - 112 mEq/L   CO2 22 19 - 32 mEq/L   Glucose, Bld 88 70 - 99 mg/dL   BUN 12 6 - 23 mg/dL   Creat 0.72 0.50 - 1.10 mg/dL     Total Bilirubin 0.3 0.2 - 1.2 mg/dL   Alkaline Phosphatase 36 (L) 39 - 117 U/L   AST 13 0 - 37 U/L   ALT 12 0 - 35 U/L   Total Protein 7.5 6.0 - 8.3 g/dL   Albumin 4.6 3.5 - 5.2 g/dL   Calcium 9.7 8.4 - 10.5 mg/dL

## 2014-12-01 ENCOUNTER — Telehealth: Payer: Self-pay

## 2014-12-01 LAB — ANA: Anti Nuclear Antibody(ANA): NEGATIVE

## 2014-12-01 LAB — SEDIMENTATION RATE: Sed Rate: 4 mm/hr (ref 0–22)

## 2014-12-01 NOTE — Telephone Encounter (Signed)
Pt states that this medication was suppose to be TID- can we change this at the pharmacy.

## 2014-12-01 NOTE — Telephone Encounter (Signed)
Pt of Dr. Laney Pastor: Saw him 12/9 to change directions on flexeril to 3 times a day, but [facility] [address] and it still says take one at bedtime. Please advise

## 2014-12-01 NOTE — Telephone Encounter (Signed)
Called in change to pharmacy.

## 2014-12-01 NOTE — Telephone Encounter (Signed)
My mistake---tid  Is correct

## 2014-12-06 ENCOUNTER — Telehealth: Payer: Self-pay | Admitting: Internal Medicine

## 2014-12-06 ENCOUNTER — Encounter: Payer: Self-pay | Admitting: Internal Medicine

## 2014-12-06 MED ORDER — TIZANIDINE HCL 6 MG PO CAPS: 6.0000 mg | ORAL_CAPSULE | Freq: Three times a day (TID) | ORAL | Status: DC

## 2014-12-06 NOTE — Telephone Encounter (Signed)
Read through Dr. Ninfa Meeker note. He suggested pt try alternative methods to help with her back pain; along with the medication. Are we able to send in any other medication or should we have pt RTC for follow up and discuss options?

## 2014-12-06 NOTE — Telephone Encounter (Signed)
Patient states that the Flexeril she was given to help her back pain is not strong enough. Can she have something else?  [phone]

## 2014-12-06 NOTE — Telephone Encounter (Signed)
We could try Zanaflex 6 mg instead of Flexeril to see if better

## 2014-12-06 NOTE — Telephone Encounter (Signed)
Sent in Zanaflex. Pt advised and will stop taking Flexeril and try this instead.

## 2014-12-07 NOTE — Telephone Encounter (Signed)
Patient stated the new medication she was given "Zanaflex" helped her sleep better last night with no spasms. Per patient when she woke up this morning and took it when she started walking around the spasms started back again. Patient not sure what to do now. She stated she really has to go to work but is unable to work like this. Per patient the tramadol she has helps some but feels she may need "Oxycodone". Per patient previously when she was having spasm she was given this medicine for a short time and it really helped her. Please call patient back at [phone]. [facility] [address]

## 2014-12-07 NOTE — Telephone Encounter (Signed)
Please advise 

## 2014-12-08 ENCOUNTER — Telehealth: Payer: Self-pay

## 2014-12-08 MED ORDER — METAXALONE 800 MG PO TABS: 800.0000 mg | ORAL_TABLET | Freq: Three times a day (TID) | ORAL | Status: DC

## 2014-12-08 NOTE — Telephone Encounter (Signed)
Pt called and said that Zanaflex helps tremendously at night with her pain but unable to take during the day. She is hurting a lot during the day esp with walking. Tramadol is not helping at all and she has been taking Ibuprofen 800 BID as well without relief during the day. Wants to know if we can give her something else to take so that she can go to work tomorrow. Unable to wait until Dr. Laney Pastor comes back. Please advise. Thanks.

## 2014-12-08 NOTE — Telephone Encounter (Signed)
She should increase her motrin to 800mg  tid.  I will also send some skelaxin to the pharmacy that will hopefully not make sure sleepy that she can take during the day.

## 2014-12-08 NOTE — Telephone Encounter (Signed)
Spoke to pt, relayed message. She voiced an understanding.  She was concerned with this medication making her sleepy. i explained there is no way of knowing how a medication with affect her. She understood and stated she would pick this new medication up.

## 2014-12-08 NOTE — Telephone Encounter (Signed)
Patient likes the new medication for pain.  She now has pain when she walks around.  Wants something else till the muscle heals.  Wants Oxycodone please call [phone]

## 2014-12-17 NOTE — Telephone Encounter (Signed)
Patient states she is having trouble getting her RX refilled. States her back pain is not easing.   [phone]

## 2014-12-18 ENCOUNTER — Telehealth: Payer: Self-pay

## 2014-12-18 MED ORDER — TRAMADOL HCL 50 MG PO TABS: 50.0000 mg | ORAL_TABLET | Freq: Four times a day (QID) | ORAL | Status: DC

## 2014-12-18 NOTE — Telephone Encounter (Signed)
Pt called in inquiring about a script for Tramadol. She states she called yesterday but I do not see a message concerning this matter. Please advise. Thank you

## 2014-12-18 NOTE — Telephone Encounter (Signed)
Strained her back last week. Increased her Tramadol use to 2 tablets QID x 7 days, and has run out.  Because of the instructions on the label, the pharmacist is unable to refill it until tomorrow.  Discussed with Dr. Laney Pastor. OK to adjust instructions to better reflect the way she is actually taking the product, refill x 4.

## 2014-12-19 NOTE — Telephone Encounter (Signed)
See notes under 12/18/14 phone mess.

## 2015-01-06 ENCOUNTER — Telehealth: Payer: Self-pay

## 2015-01-06 NOTE — Telephone Encounter (Signed)
Pharm also called and LM about this to get approval for early fill. I will also send to PA pool in Dr Doolittle's absence.

## 2015-01-06 NOTE — Telephone Encounter (Signed)
Certainly ok

## 2015-01-06 NOTE — Telephone Encounter (Signed)
Patients uncle passed away.  She needs to leave town early Monday morning.   Requesting early refill on Tramadol for Saturday pick up at [facility] on [address]   Dr. Laney Pastor   [phone]

## 2015-01-07 NOTE — Telephone Encounter (Signed)
Please make sure that the pharmacy is called and the medication can be filled early.

## 2015-01-07 NOTE — Telephone Encounter (Signed)
Done and pt notified. 

## 2015-02-08 ENCOUNTER — Telehealth: Payer: Self-pay

## 2015-02-09 ENCOUNTER — Telehealth: Payer: Self-pay

## 2015-02-09 NOTE — Telephone Encounter (Signed)
Patient is calling to request cough syrup medication (isn't sure of the name). Patient states that she coughs a lot only at night and hasn't been able to sleep. Patient also states that she tried to get the medication through the pharmacy and she was directed to call us. Please call patient for further questions [phone].

## 2015-02-10 NOTE — Telephone Encounter (Signed)
Please call, pt needs ov for eval.

## 2015-02-13 NOTE — Telephone Encounter (Signed)
Spoke with pt, she was told she had to come in. Pt understood.

## 2015-02-14 ENCOUNTER — Other Ambulatory Visit: Payer: Self-pay | Admitting: Physician Assistant

## 2015-02-14 NOTE — Telephone Encounter (Signed)
Dr Laney Pastor, at first I thought pt should still have RFs, but if taking max dose, she could have used them all by now.

## 2015-02-15 ENCOUNTER — Other Ambulatory Visit: Payer: Self-pay | Admitting: Internal Medicine

## 2015-02-15 NOTE — Telephone Encounter (Signed)
Faxed

## 2015-02-16 NOTE — Telephone Encounter (Signed)
Faxed

## 2015-02-22 ENCOUNTER — Other Ambulatory Visit: Payer: Self-pay | Admitting: Internal Medicine

## 2015-02-23 ENCOUNTER — Telehealth: Payer: Self-pay

## 2015-02-23 NOTE — Telephone Encounter (Signed)
Physical therapy with Dr. Belia Heman Continue medications  another month before reevaluation Meds ordered this encounter  Medications  . traMADol (ULTRAM) 50 MG tablet    Sig: TAKE 1 to 2 TABLETS BY MOUTH EVERY 6 HOURS AS NEEDED FOR BACK PAIN.    Dispense:  120 tablet    Refill:  1  . tiZANidine (ZANAFLEX) 2 MG tablet    Sig:     Refill:  0

## 2015-02-23 NOTE — Telephone Encounter (Signed)
Patient is calling for Dr. Sonia Baller. She states that Dr. Laney Pastor forgot to refill her tramadol medication. Patient needs refill for tramadol sent to [facility]!

## 2015-02-24 NOTE — Telephone Encounter (Signed)
Faxed Rx and notified pt.

## 2015-02-25 ENCOUNTER — Telehealth: Payer: Self-pay

## 2015-02-25 NOTE — Telephone Encounter (Signed)
Pt is needing to talk with dr Laney Pastor to have her tramodal   Refilled early patient is going out of town    [facility] on [address] [phone]

## 2015-02-25 NOTE — Telephone Encounter (Signed)
Ok this time and then we may be cahnging meds depending on her completion of PT

## 2015-02-25 NOTE — Telephone Encounter (Signed)
Pharm called concerned because this is not a one time request for an early RF. Pt has done this multiple times. Please advise. Thanks

## 2015-02-26 NOTE — Telephone Encounter (Signed)
Pt is currently out of town now. Will do what she can with her pain and then RF her meds when she gets back

## 2015-03-11 ENCOUNTER — Other Ambulatory Visit: Payer: Self-pay | Admitting: Internal Medicine

## 2015-03-13 ENCOUNTER — Telehealth: Payer: Self-pay

## 2015-03-13 MED ORDER — CLONIDINE HCL 0.1 MG PO TABS: 0.1000 mg | ORAL_TABLET | Freq: Every day | ORAL | Status: DC

## 2015-03-13 MED ORDER — TRAZODONE HCL 150 MG PO TABS: 300.0000 mg | ORAL_TABLET | Freq: Every day | ORAL | Status: DC

## 2015-03-13 NOTE — Telephone Encounter (Signed)
Pharm called to verify that pt's tramadol was supposed to have a RF on it from 02/23/15 Rx. I gave them this verbally bc it did not transfer with RF. Also wanted RFs of clonidine and trazadone. I gave 30 days only for these two w/pharm agreeing to tell pt that she needs OV for more, and if she wants 90 day supply, she can go ahead and come in right away instead of getting the 30 day RFs.

## 2015-03-14 ENCOUNTER — Other Ambulatory Visit: Payer: Self-pay | Admitting: Internal Medicine

## 2015-03-25 ENCOUNTER — Telehealth: Payer: Self-pay

## 2015-03-25 NOTE — Telephone Encounter (Signed)
Pt needs a refill on tramadol and would like a couple of refills since she does not have an appt with dr Laney Pastor til may 2016 and would like to make sure that the [facility] and that it is changed in her chart    Best number [phone]

## 2015-03-27 ENCOUNTER — Other Ambulatory Visit: Payer: Self-pay | Admitting: Internal Medicine

## 2015-03-27 MED ORDER — TRAMADOL HCL 50 MG PO TABS: ORAL_TABLET | ORAL | Status: DC

## 2015-03-27 NOTE — Telephone Encounter (Signed)
[name] Still needs meds f/u in may Meds ordered this encounter  Medications  . traMADol (ULTRAM) 50 MG tablet    Sig: TAKE 1 to 2 TABLETS BY MOUTH EVERY 6 HOURS AS NEEDED FOR BACK PAIN.    Dispense:  120 tablet    Refill:  2

## 2015-03-27 NOTE — Telephone Encounter (Addendum)
Called in. Notified pt who agreed to keep her appt in May, and did also verify that she does see [name] currently.

## 2015-03-29 ENCOUNTER — Encounter: Payer: Self-pay | Admitting: Internal Medicine

## 2015-04-20 ENCOUNTER — Other Ambulatory Visit: Payer: Self-pay | Admitting: Internal Medicine

## 2015-05-02 ENCOUNTER — Telehealth: Payer: Self-pay

## 2015-05-02 NOTE — Telephone Encounter (Signed)
cloNIDine (CATAPRES) 0.1 MG tablet traZODone (DESYREL) 150 MG tablet   Patient cancelled her appointment with Dr. Laney Pastor for tomorrow and knows she has to see him before any more refills.   [phone] (H)

## 2015-05-02 NOTE — Telephone Encounter (Signed)
Can get her thru til July for these 2 if needed

## 2015-05-02 NOTE — Telephone Encounter (Signed)
Rescheduled for July 6th.

## 2015-05-03 ENCOUNTER — Encounter: Payer: Self-pay | Admitting: Internal Medicine

## 2015-05-03 MED ORDER — CLONIDINE HCL 0.1 MG PO TABS: 0.1000 mg | ORAL_TABLET | Freq: Every day | ORAL | Status: DC

## 2015-05-03 MED ORDER — TRAZODONE HCL 150 MG PO TABS: 300.0000 mg | ORAL_TABLET | Freq: Every day | ORAL | Status: DC

## 2015-05-03 MED ORDER — TRAMADOL HCL 50 MG PO TABS: ORAL_TABLET | ORAL | Status: DC

## 2015-05-03 NOTE — Telephone Encounter (Signed)
Rx sent in. Pt notified. 

## 2015-05-03 NOTE — Addendum Note (Signed)
Addended by: Jannette Spanner on: 05/03/2015 03:42 PM   Modules accepted: Orders, Medications

## 2015-05-03 NOTE — Telephone Encounter (Signed)
Ok to call in both

## 2015-05-03 NOTE — Telephone Encounter (Signed)
Patient would like her medication sent to [facility] on [address]

## 2015-05-03 NOTE — Telephone Encounter (Signed)
Yes, can you write her for these 2 until July 6th when her appt is with you? [facility] on [address].

## 2015-05-03 NOTE — Telephone Encounter (Signed)
Pt states she needs Tramadol also.

## 2015-05-03 NOTE — Addendum Note (Signed)
Addended by: Leandrew Koyanagi on: 05/03/2015 06:10 PM   Modules accepted: Orders

## 2015-05-03 NOTE — Telephone Encounter (Signed)
Pt
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
## 2015-05-16 ENCOUNTER — Other Ambulatory Visit: Payer: Self-pay | Admitting: Internal Medicine

## 2015-05-17 NOTE — Telephone Encounter (Signed)
This was written 5/11 - with a refill the patient should not need this

## 2015-05-31 ENCOUNTER — Other Ambulatory Visit: Payer: Self-pay | Admitting: Internal Medicine

## 2015-06-02 NOTE — Telephone Encounter (Signed)
Dr Alecia Lemming RFs of this until July appt, just not controlled.

## 2015-06-21 ENCOUNTER — Ambulatory Visit: Payer: Self-pay | Admitting: Internal Medicine

## 2015-06-28 ENCOUNTER — Ambulatory Visit (INDEPENDENT_AMBULATORY_CARE_PROVIDER_SITE_OTHER): Payer: BLUE CROSS/BLUE SHIELD | Admitting: Internal Medicine

## 2015-06-28 ENCOUNTER — Encounter: Payer: Self-pay | Admitting: Internal Medicine

## 2015-06-28 VITALS — BP 127/89 | HR 105 | Temp 98.8°F | Resp 16 | Ht 69.0 in | Wt 183.0 lb

## 2015-06-28 DIAGNOSIS — R232 Flushing: Secondary | ICD-10-CM

## 2015-06-28 DIAGNOSIS — M545 Low back pain, unspecified: Secondary | ICD-10-CM

## 2015-06-28 DIAGNOSIS — G4700 Insomnia, unspecified: Secondary | ICD-10-CM

## 2015-06-28 DIAGNOSIS — N951 Menopausal and female climacteric states: Secondary | ICD-10-CM

## 2015-06-28 DIAGNOSIS — Z0000 Encounter for general adult medical examination without abnormal findings: Secondary | ICD-10-CM | POA: Diagnosis not present

## 2015-06-28 DIAGNOSIS — Z01419 Encounter for gynecological examination (general) (routine) without abnormal findings: Secondary | ICD-10-CM

## 2015-06-28 LAB — COMPREHENSIVE METABOLIC PANEL
ALT: 11 U/L (ref 0–35)
AST: 11 U/L (ref 0–37)
Albumin: 4.7 g/dL (ref 3.5–5.2)
Alkaline Phosphatase: 36 U/L — ABNORMAL LOW (ref 39–117)
BILIRUBIN TOTAL: 0.4 mg/dL (ref 0.2–1.2)
BUN: 14 mg/dL (ref 6–23)
CALCIUM: 9.4 mg/dL (ref 8.4–10.5)
CO2: 18 mEq/L — ABNORMAL LOW (ref 19–32)
Chloride: 105 mEq/L (ref 96–112)
Creat: 0.74 mg/dL (ref 0.50–1.10)
Glucose, Bld: 90 mg/dL (ref 70–99)
Potassium: 4.1 mEq/L (ref 3.5–5.3)
Sodium: 138 mEq/L (ref 135–145)
TOTAL PROTEIN: 7.5 g/dL (ref 6.0–8.3)

## 2015-06-28 LAB — CBC WITH DIFFERENTIAL/PLATELET
BASOS ABS: 0 10*3/uL (ref 0.0–0.1)
Basophils Relative: 0 % (ref 0–1)
EOS PCT: 0 % (ref 0–5)
Eosinophils Absolute: 0 10*3/uL (ref 0.0–0.7)
HCT: 43.2 % (ref 36.0–46.0)
Hemoglobin: 14.7 g/dL (ref 12.0–15.0)
Lymphocytes Relative: 14 % (ref 12–46)
Lymphs Abs: 1.6 10*3/uL (ref 0.7–4.0)
MCH: 31.3 pg (ref 26.0–34.0)
MCHC: 34 g/dL (ref 30.0–36.0)
MCV: 91.9 fL (ref 78.0–100.0)
MONO ABS: 0.5 10*3/uL (ref 0.1–1.0)
MPV: 9.6 fL (ref 8.6–12.4)
Monocytes Relative: 4 % (ref 3–12)
NEUTROS ABS: 9.5 10*3/uL — AB (ref 1.7–7.7)
Neutrophils Relative %: 82 % — ABNORMAL HIGH (ref 43–77)
Platelets: 382 10*3/uL (ref 150–400)
RBC: 4.7 MIL/uL (ref 3.87–5.11)
RDW: 14.3 % (ref 11.5–15.5)
WBC: 11.6 10*3/uL — AB (ref 4.0–10.5)

## 2015-06-28 LAB — LIPID PANEL
CHOL/HDL RATIO: 2.3 ratio
Cholesterol: 190 mg/dL (ref 0–200)
HDL: 84 mg/dL (ref >=46)
LDL Cholesterol: 80 mg/dL (ref 0–99)
TRIGLYCERIDES: 130 mg/dL (ref <150)
VLDL: 26 mg/dL (ref 0–40)

## 2015-06-28 LAB — TSH: TSH: 0.969 u[IU]/mL (ref 0.350–4.500)

## 2015-06-28 MED ORDER — TRAMADOL HCL 50 MG PO TABS: 100.0000 mg | ORAL_TABLET | Freq: Two times a day (BID) | ORAL | Status: DC | PRN

## 2015-06-28 MED ORDER — CLONIDINE HCL 0.2 MG PO TABS: 0.2000 mg | ORAL_TABLET | Freq: Every day | ORAL | Status: DC

## 2015-06-28 MED ORDER — TRAZODONE HCL 150 MG PO TABS: 300.0000 mg | ORAL_TABLET | Freq: Every day | ORAL | Status: DC

## 2015-06-28 MED ORDER — CYCLOBENZAPRINE HCL 10 MG PO TABS: ORAL_TABLET | ORAL | Status: DC

## 2015-06-28 NOTE — Progress Notes (Signed)
PCP is Patricia Newton, Linton Ham, MD Referring Provider is Leandrew Koyanagi, MD  Chief Complaint  Patient presents with  . Annual Exam  . 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 PONV (postoperative nausea and vomiting)   . Arthritis   . No pertinent past medical history   . GERD (gastroesophageal reflux disease)     tums prn  . Allergy   . Asthma   . Heart murmur     Past Surgical History  Procedure Laterality Date  . Diagnostic laparoscopy      x3 for endometriosis  . Appendectomy    . Shoulder arthroscopy  right  . Knee arthroscopy      right-x3  . Tendon exploration  03/11/2012    Procedure: TENDON EXPLORATION;  Surgeon: Wynonia Sours, MD;  Location: [facility];  Service: Orthopedics;  Laterality: Left;  EXPLORATION OF FLEXOR TENDON LEFT INDEX FINGER     . Left hand surgery  03/11/12  . Laparoscopy  03/26/2012    Procedure: LAPAROSCOPY OPERATIVE;  Surgeon: Lovenia Kim, MD;  Location: [facility];  Service: Gynecology;  Laterality: N/A;  with ablation of endometriosis   . Mouth surgery      Family History  Problem Relation Age of Onset  . Heart disease Mother   . Diabetes Father   . Hyperlipidemia Father   . Asthma Sister   . Heart disease Daughter     Social History History  Substance Use Topics  . Smoking status: Current Every Day Smoker -- 0.50 packs/day for 15 years    Types: Cigarettes  . Smokeless tobacco: Never Used  . Alcohol Use: Yes     Comment: rare    Current Outpatient Prescriptions  Medication Sig Dispense Refill  . cloNIDine (CATAPRES) 0.2 MG tablet Take 1 tablet (0.2 mg total) by mouth at bedtime. 90 tablet 3  . cyclobenzaprine (FLEXERIL) 10 MG tablet TAKE 1 TABLET BY MOUTH AT BEDTIME 30  tablet 5  . traMADol (ULTRAM) 50 MG tablet Take 2 tablets (100 mg total) by mouth every 12 (twelve) hours as needed. TAKE 1 to 2 TABLETS BY MOUTH EVERY 6 HOURS AS NEEDED FOR BACK PAIN. 50 tablet 1  . traZODone (DESYREL) 150 MG tablet Take 2 tablets (300 mg total) by mouth at bedtime. 180 tablet 3   No current facility-administered medications for this visit.    Allergies  Allergen Reactions  . Erythromycin Diarrhea  . Vancomycin Cross Reactors Hives    Review of Systems 14 pt ros per form negative except continued problems with endometriosis//3-4 mos of hot flashes without change in freq of menses and occas trouble with sleep-falling and staying but no hypersom,snoring or apnea. traz longterm has helped    Physical Exam  Musculoskeletal:  -SLR No sens losses Mild tend lumbar palp   BP 127/89 mmHg  Pulse 105  Temp(Src) 98.8 F (37.1 C)  Resp 16  Ht $R'5\' 9"'KR$  (1.753 m)  Wt 183 lb (83.008 kg)  BMI 27.01 kg/m2  LMP 06/05/2015  Physical Exam  Constitutional: She is oriented to person, place, and time. She appears well-developed and well-nourished. No distress.  HENT:  Head: Normocephalic and atraumatic.  Right Ear: External ear normal.  Left Ear: External ear  normal.  Nose: Nose normal.  Mouth/Throat: Oropharynx is clear and moist. No oropharyngeal exudate.  Eyes: Conjunctivae are normal. Pupils are equal, round, and reactive to light.  Neck: Normal range of motion. Neck supple. No JVD present. No thyromegaly present.  Cardiovascular: Normal rate, regular rhythm, normal heart sounds and intact distal pulses.   Pulmonary/Chest: Effort normal and breath sounds normal. Right breast exhibits no inverted nipple, no mass, no nipple discharge, no skin change and no tenderness. Left breast exhibits no inverted nipple, no mass, no nipple discharge, no skin change and no tenderness. Breasts are symmetrical.  Abdominal: Soft. Bowel sounds are normal. She exhibits no distension and no mass.  There is no tenderness. There is no rebound and no guarding.  Genitourinary: Vagina normal. Pelvic exam was performed with patient supine. There is no rash, tenderness, lesion or injury on the right labia. There is no rash, tenderness, lesion or injury on the left labia. Cervix exhibits no motion tenderness and no discharge. No vaginal discharge found. No rectocele or cystocele.  Musculoskeletal: Normal range of motion. She exhibits no edema or tenderness.  Lymphadenopathy:    She has no cervical adenopathy.  Neurological: She is alert and oriented to person, place, and time. She has normal reflexes.  Skin: Skin is warm and dry. She is not diaphoretic.  Psychiatric: She has a normal mood and affect. Her behavior is normal. Judgment and thought content normal.  Vitals reviewed.   I/P Annual physical exam - Plan: CBC with Differential/Platelet, Comprehensive metabolic panel, Lipid panel, Pap IG and HPV (high risk) DNA detection  Hot flashes - Plan: TSH  Midline low back pain without sciatica  Needs more PT/yoga long term/wt loss/we disc tramadol as possible addic prob(see hx Lortab)  Disc possible CBT   Insomnia  Cont traz/clonodine  Overweight  Meds ordered this encounter  Medications  . cyclobenzaprine (FLEXERIL) 10 MG tablet    Sig: TAKE 1 TABLET BY MOUTH AT BEDTIME    Dispense:  30 tablet    Refill:  5  . traMADol (ULTRAM) 50 MG tablet    Sig: Take 2 tablets (100 mg total) by mouth every 12 (twelve) hours as needed. TAKE 1 to 2 TABLETS BY MOUTH EVERY 6 HOURS AS NEEDED FOR BACK PAIN.    Dispense:  50 tablet    Refill:  1  . cloNIDine (CATAPRES) 0.2 MG tablet    Sig: Take 1 tablet (0.2 mg total) by mouth at bedtime.    Dispense:  90 tablet    Refill:  3  . traZODone (DESYREL) 150 MG tablet    Sig: Take 2 tablets (300 mg total) by mouth at bedtime.    Dispense:  180 tablet    Refill:  3    Addend-labs 06/30/15 Results for orders placed or performed in visit on 06/28/15   CBC with Differential/Platelet  Result Value Ref Range   WBC 11.6 (H) 4.0 - 10.5 K/uL   RBC 4.70 3.87 - 5.11 MIL/uL   Hemoglobin 14.7 12.0 - 15.0 g/dL   HCT 86.1 33.6 - 49.3 %   MCV 91.9 78.0 - 100.0 fL   MCH 31.3 26.0 - 34.0 pg   MCHC 34.0 30.0 - 36.0 g/dL   RDW 22.7 90.0 - 75.4 %   Platelets 382 150 - 400 K/uL   MPV 9.6 8.6 - 12.4 fL   Neutrophils Relative % 82 (H) 43 - 77 %   Neutro Abs 9.5 (H) 1.7 - 7.7 K/uL  Lymphocytes Relative 14 12 - 46 %   Lymphs Abs 1.6 0.7 - 4.0 K/uL   Monocytes Relative 4 3 - 12 %   Monocytes Absolute 0.5 0.1 - 1.0 K/uL   Eosinophils Relative 0 0 - 5 %   Eosinophils Absolute 0.0 0.0 - 0.7 K/uL   Basophils Relative 0 0 - 1 %   Basophils Absolute 0.0 0.0 - 0.1 K/uL   Smear Review Criteria for review not met   Comprehensive metabolic panel  Result Value Ref Range   Sodium 138 135 - 145 mEq/L   Potassium 4.1 3.5 - 5.3 mEq/L   Chloride 105 96 - 112 mEq/L   CO2 18 (L) 19 - 32 mEq/L   Glucose, Bld 90 70 - 99 mg/dL   BUN 14 6 - 23 mg/dL   Creat 0.74 0.50 - 1.10 mg/dL   Total Bilirubin 0.4 0.2 - 1.2 mg/dL   Alkaline Phosphatase 36 (L) 39 - 117 U/L   AST 11 0 - 37 U/L   ALT 11 0 - 35 U/L   Total Protein 7.5 6.0 - 8.3 g/dL   Albumin 4.7 3.5 - 5.2 g/dL   Calcium 9.4 8.4 - 10.5 mg/dL  TSH  Result Value Ref Range   TSH 0.969 0.350 - 4.500 uIU/mL  Lipid panel  Result Value Ref Range   Cholesterol 190 0 - 200 mg/dL   Triglycerides 130 <150 mg/dL   HDL 84 >=46 mg/dL   Total CHOL/HDL Ratio 2.3 Ratio   VLDL 26 0 - 40 mg/dL   LDL Cholesterol 80 0 - 99 mg/dL  Pap IG and HPV (high risk) DNA detection  Result Value Ref Range   HPV DNA High Risk Not Detected    Specimen adequacy: SEE NOTE    FINAL DIAGNOSIS: SEE NOTE    Cytotechnologist: SEE NOTE

## 2015-06-28 NOTE — Patient Instructions (Signed)
Kegel Exercises The goal of Kegel exercises is to isolate and exercise your pelvic floor muscles. These muscles act as a hammock that supports the rectum, vagina, small intestine, and uterus. As the muscles weaken, the hammock sags and these organs are displaced from their normal positions. Kegel exercises can strengthen your pelvic floor muscles and help you to improve bladder and bowel control, improve sexual response, and help reduce many problems and some discomfort during pregnancy. Kegel exercises can be done anywhere and at any time. HOW TO PERFORM KEGEL EXERCISES 1. Locate your pelvic floor muscles. To do this, squeeze (contract) the muscles that you use when you try to stop the flow of urine. You will feel a tightness in the vaginal area (women) and a tight lift in the rectal area (men and women). 2. When you begin, contract your pelvic muscles tight for 2-5 seconds, then relax them for 2-5 seconds. This is one set. Do 4-5 sets with a short pause in between. 3. Contract your pelvic muscles for 8-10 seconds, then relax them for 8-10 seconds. Do 4-5 sets. If you cannot contract your pelvic muscles for 8-10 seconds, try 5-7 seconds and work your way up to 8-10 seconds. Your goal is 4-5 sets of 10 contractions each day. Keep your stomach, buttocks, and legs relaxed during the exercises. Perform sets of both short and long contractions. Vary your positions. Perform these contractions 3-4 times per day. Perform sets while you are:   Lying in bed in the morning.  Standing at lunch.  Sitting in the late afternoon.  Lying in bed at night. You should do 40-50 contractions per day. Do not perform more Kegel exercises per day than recommended. Overexercising can cause muscle fatigue. Continue these exercises for for at least 15-20 weeks or as directed by your caregiver. Document Released: 11/25/2012 Document Reviewed: 11/25/2012 [facility] Patient Information 2015 [place]. This information is  not intended to replace advice given to you by your health care provider. Make sure you discuss any questions you have with your health care provider.   Perimenopause Perimenopause is the time when your body begins to move into the menopause (no menstrual period for 12 straight months). It is a natural process. Perimenopause can begin 2-8 years before the menopause and usually lasts for 1 year after the menopause. During this time, your ovaries may or may not produce an egg. The ovaries vary in their production of estrogen and progesterone hormones each month. This can cause irregular menstrual periods, difficulty getting pregnant, vaginal bleeding between periods, and uncomfortable symptoms. CAUSES 4. Irregular production of the ovarian hormones, estrogen and progesterone, and not ovulating every month. 5. Other causes include: 1. Tumor of the pituitary gland in the brain. 2. Medical disease that affects the ovaries. 3. Radiation treatment. 4. Chemotherapy. 5. Unknown causes. 6. Heavy smoking and excessive alcohol intake can bring on perimenopause sooner. SIGNS AND SYMPTOMS   Hot flashes.  Night sweats.  Irregular menstrual periods.  Decreased sex drive.  Vaginal dryness.  Headaches.  Mood swings.  Depression.  Memory problems.  Irritability.  Tiredness.  Weight gain.  Trouble getting pregnant.  The beginning of losing bone cells (osteoporosis).  The beginning of hardening of the arteries (atherosclerosis). DIAGNOSIS  Your health care provider will make a diagnosis by analyzing your age, menstrual history, and symptoms. He or she will do a physical exam and note any changes in your body, especially your female organs. Female hormone tests may or may not be helpful  depending on the amount of female hormones you produce and when you produce them. However, other hormone tests may be helpful to rule out other problems. TREATMENT  In some cases, no treatment is needed. The  decision on whether treatment is necessary during the perimenopause should be made by you and your health care provider based on how the symptoms are affecting you and your lifestyle. Various treatments are available, such as:  Treating individual symptoms with a specific medicine for that symptom.  Herbal medicines that can help specific symptoms.  Counseling.  Group therapy. HOME CARE INSTRUCTIONS   Keep track of your menstrual periods (when they occur, how heavy they are, how long between periods, and how long they last) as well as your symptoms and when they started.  Only take over-the-counter or prescription medicines as directed by your health care provider.  Sleep and rest.  Exercise.  Eat a diet that contains calcium (good for your bones) and soy (acts like the estrogen hormone).  Do not smoke.  Avoid alcoholic beverages.  Take vitamin supplements as recommended by your health care provider. Taking vitamin E may help in certain cases.  Take calcium and vitamin D supplements to help prevent bone loss.  Group therapy is sometimes helpful.  Acupuncture may help in some cases. SEEK MEDICAL CARE IF:   You have questions about any symptoms you are having.  You need a referral to a specialist (gynecologist, psychiatrist, or psychologist). SEEK IMMEDIATE MEDICAL CARE IF:   You have vaginal bleeding.  Your period lasts longer than 8 days.  Your periods are recurring sooner than 21 days.  You have bleeding after intercourse.  You have severe depression.  You have pain when you urinate.  You have severe headaches.  You have vision problems. Document Released: 01/16/2005 Document Revised: 09/29/2013 Document Reviewed: 07/08/2013 [facility] Patient Information 2015 [place], [place]. This information is not intended to replace advice given to you by your health care provider. Make sure you discuss any questions you have with your health care provider.

## 2015-06-29 LAB — PAP IG AND HPV HIGH-RISK: HPV DNA HIGH RISK: NOT DETECTED

## 2015-07-31 ENCOUNTER — Other Ambulatory Visit: Payer: Self-pay | Admitting: Internal Medicine

## 2015-08-02 NOTE — Telephone Encounter (Signed)
Please try and figure out why the patient needs a refill - she was given a Rx on 7/6 with a refill.

## 2015-08-02 NOTE — Telephone Encounter (Signed)
Patricia Newton, I called pharm who reported that pt got #50 on 06/28/15, and then got the RF on 07/05/15. If taken at the max dose on sig (2 tabs Q6hrs), #50 is just a 6 day supply. I asked if pt normally fills the Rx that often and pharm stated that she is "quite a regular for this medication" and that they have had a problem with her trying to fill "fake" Rxs before as well.

## 2015-08-03 ENCOUNTER — Other Ambulatory Visit: Payer: Self-pay | Admitting: Internal Medicine

## 2015-08-03 ENCOUNTER — Telehealth: Payer: Self-pay | Admitting: *Deleted

## 2015-08-03 NOTE — Telephone Encounter (Signed)
Dr Laney Pastor will be here tomorrow and needs to make this decision.

## 2015-08-03 NOTE — Telephone Encounter (Signed)
Pt has called twice about her rx for tramadol.  It looks like we last filled on 7/6 and is due to refill.  She is completely out. Can you fill this or should she wait for Dr. Laney Pastor tom?  Someone told her earlier that it was sent in and it was not.  It is in the refill request pool.

## 2015-08-03 NOTE — Telephone Encounter (Signed)
Patient called back about her prescription for Tramadol.  She said she called the pharmacy, and they do not have a record of receiving the prescription.  She requests that it be re-sent to her pharmacy ([facility] [address]) tonight, if possible.  CB#: [phone]

## 2015-08-03 NOTE — Telephone Encounter (Signed)
Pt will wait til tom for Dr. Laney Pastor to review

## 2015-08-03 NOTE — Telephone Encounter (Signed)
Patient is checking on her tramadol script.   [phone] (H)

## 2015-08-04 ENCOUNTER — Telehealth: Payer: Self-pay

## 2015-08-04 DIAGNOSIS — G8929 Other chronic pain: Secondary | ICD-10-CM

## 2015-08-04 DIAGNOSIS — M549 Dorsalgia, unspecified: Principal | ICD-10-CM

## 2015-08-04 NOTE — Telephone Encounter (Signed)
Rx in pick up draw. 

## 2015-08-04 NOTE — Telephone Encounter (Signed)
Patient [facility] up [facility] [address]. She stated she noticed when she got home the directions were different from before. The bottle has her taking 1-2 tabs every 12 hours and she normally takes it every 6 hours. She is concerned this will effect her getting her next refill at the pharmacy. Patients call back number is [phone]

## 2015-08-04 NOTE — Telephone Encounter (Signed)
Meds ordered this encounter  Medications  . traMADol (ULTRAM) 50 MG tablet    Sig: Take 1-2 tablets (50-100 mg total) by mouth every 12 (twelve) hours as needed.    Dispense:  50 tablet    Refill:  1    Not to exceed 4 additional fills before 12/25/2015.   Ask if we need to refer for more PT or for orthopedic eval at this point since she continues to need meds

## 2015-08-04 NOTE — Telephone Encounter (Signed)
Correction Rx faxed in.

## 2015-08-05 NOTE — Telephone Encounter (Signed)
At her last OV we discussed how her job change would improve her back pain and she would likely need less pain medicine and so we planned for her to use after work and or bedtime So I wrote this as twice a day to fit that  Now, Looking back at the last month it looks like she is needing this 4 times a day If so, we need more aggressive treatment not pain meds and She will need referral to orthopedics or Alroy Dust Smith(sports med at [place])for a consistent treatment--She has not been given a diagnosis yet that would allow for everyday pain meds Does she have a choice for her referral?  Past hx=Her back pain started during her pregnancy with her second child 9 yrs ago with significant mid-back pain causing her to present to the ED several times for c/f labor. She also had significant back labor when she delivered. She has had intermittent mid back pain since then. The pain spread to her lower back over time and was intermittent until a year ago. 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 But now has changed to job that doesn't have the same back stressors as noted in last OV The chart shows a long hx of pain meds for various things/lots of phone calls

## 2015-08-08 NOTE — Telephone Encounter (Signed)
We can keep her in meds til Ortho sees her and i will refer to dr Tamala Julian

## 2015-08-08 NOTE — Telephone Encounter (Signed)
Spoke to pt and told her what Dr Laney Pastor had advised. She reported that at the time of the OV, she wasn't having to lift and maneuver pts as much. 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 She stated that her use of the tramadol changes accordingly. Pt is willing to see whichever ortho Dr Laney Pastor would like. I advised her to call us back if she runs out of the tramadol before she gets in to see ortho. Dr Laney Pastor, do you want to refer to Hulan Saas or a different ortho doc?

## 2015-08-12 ENCOUNTER — Telehealth: Payer: Self-pay

## 2015-08-12 NOTE — Telephone Encounter (Signed)
Pt states she takes her Tremadol 1 every 4-6 hrs. She wants Dr.Doolittle to change the directions for her to be able to get a refill before she see's the orthopedist. Claims the medication refill is to early.  Please advise [phone]

## 2015-08-15 NOTE — Telephone Encounter (Signed)
Call her re clonidine--I'll change the tramadol after i hear from you again

## 2015-08-15 NOTE — Telephone Encounter (Signed)
Patient has a question for [facility] regarding her Clonidine prescription. Cb# [phone].

## 2015-08-16 NOTE — Telephone Encounter (Signed)
LMOM for pt to CB.  

## 2015-08-16 NOTE — Telephone Encounter (Signed)
Pt has been taking (2) 0.2 mg Clonidine since you prescribed it for her on 7/6 and it has been doing very well. Rx running out in 45 days instead of 90. Wants to know if we can re prescribe for (2) QD and send in a new rx

## 2015-08-17 MED ORDER — TRAMADOL HCL 50 MG PO TABS: 50.0000 mg | ORAL_TABLET | Freq: Two times a day (BID) | ORAL | Status: DC | PRN

## 2015-08-17 MED ORDER — CLONIDINE HCL 0.3 MG PO TABS: 0.3000 mg | ORAL_TABLET | Freq: Every day | ORAL | Status: DC

## 2015-08-17 NOTE — Telephone Encounter (Signed)
Gave pt message from Dr Laney Pastor. She agreed to take the new dose of 0.3 mg clonidine. She stated that her Hospice pt died over the weekend, so she is hoping that her back pain will stop being aggravated. She stated that if it improves, she may postpone her ortho appt until/unless it worsens again, since she has a high deductible. I advised that if she does postpone appt and needs more tramadol, Dr Laney Pastor may not be able to keep Rxing it since he had referred her to specialist to manage. Pt voiced understanding. Dr Laney Pastor, Juluis Rainier

## 2015-08-17 NOTE — Telephone Encounter (Signed)
Let her know the top clonidine dose is .3mg  which i sent in Tramadol refilled 1 mo supply 1-2 every 12 hrs(or she can use 1 q 6 h as currently doing) 1 refill awaiting ortho eval Meds ordered this encounter  Medications  . cloNIDine (CATAPRES) 0.3 MG tablet    Sig: Take 1 tablet (0.3 mg total) by mouth at bedtime.    Dispense:  90 tablet    Refill:  1  . traMADol (ULTRAM) 50 MG tablet    Sig: Take 1-2 tablets (50-100 mg total) by mouth every 12 (twelve) hours as needed.    Dispense:  120 tablet    Refill:  1    Not to exceed 4 additional fills before 12/25/2015.

## 2015-08-18 NOTE — Telephone Encounter (Signed)
Faxed tramadol RF.

## 2015-08-26 ENCOUNTER — Other Ambulatory Visit: Payer: Self-pay | Admitting: Internal Medicine

## 2015-10-19 ENCOUNTER — Other Ambulatory Visit: Payer: Self-pay | Admitting: Internal Medicine

## 2015-10-24 NOTE — Telephone Encounter (Signed)
I checked the Referral notes and copied this last note written yesterday: "Sent to [place] work queue.  It had been in the wrong work queue. 10/23/15-MJ"  Do you want to do the RF until pt gets an appt?

## 2015-10-24 NOTE — Telephone Encounter (Signed)
What happened to her referral to Dr Charlann Boxer for further eval????

## 2015-10-24 NOTE — Telephone Encounter (Signed)
Go ahead with referral dr Tamala Julian is at [facility]

## 2015-10-27 NOTE — Telephone Encounter (Signed)
Referrals, can you please check to make sure referral is going to Dr Charlann Boxer please. See notes below.

## 2015-11-06 ENCOUNTER — Emergency Department (HOSPITAL_BASED_OUTPATIENT_CLINIC_OR_DEPARTMENT_OTHER)
Admission: EM | Admit: 2015-11-06 | Discharge: 2015-11-06 | Disposition: A | Discharge status: Home / Self Care | Payer: BLUE CROSS/BLUE SHIELD | Attending: Emergency Medicine | Admitting: Emergency Medicine

## 2015-11-06 ENCOUNTER — Encounter (HOSPITAL_BASED_OUTPATIENT_CLINIC_OR_DEPARTMENT_OTHER): Payer: Self-pay

## 2015-11-06 ENCOUNTER — Emergency Department (HOSPITAL_BASED_OUTPATIENT_CLINIC_OR_DEPARTMENT_OTHER): Payer: BLUE CROSS/BLUE SHIELD

## 2015-11-06 DIAGNOSIS — W07XXXA Fall from chair, initial encounter: Secondary | ICD-10-CM | POA: Diagnosis not present

## 2015-11-06 DIAGNOSIS — S59911A Unspecified injury of right forearm, initial encounter: Secondary | ICD-10-CM | POA: Insufficient documentation

## 2015-11-06 DIAGNOSIS — Z8719 Personal history of other diseases of the digestive system: Secondary | ICD-10-CM | POA: Diagnosis not present

## 2015-11-06 DIAGNOSIS — W19XXXA Unspecified fall, initial encounter: Secondary | ICD-10-CM

## 2015-11-06 DIAGNOSIS — Y998 Other external cause status: Secondary | ICD-10-CM | POA: Diagnosis not present

## 2015-11-06 DIAGNOSIS — Y9289 Other specified places as the place of occurrence of the external cause: Secondary | ICD-10-CM | POA: Insufficient documentation

## 2015-11-06 DIAGNOSIS — J45909 Unspecified asthma, uncomplicated: Secondary | ICD-10-CM | POA: Diagnosis not present

## 2015-11-06 DIAGNOSIS — Y9389 Activity, other specified: Secondary | ICD-10-CM | POA: Insufficient documentation

## 2015-11-06 DIAGNOSIS — M1990 Unspecified osteoarthritis, unspecified site: Secondary | ICD-10-CM | POA: Insufficient documentation

## 2015-11-06 DIAGNOSIS — R011 Cardiac murmur, unspecified: Secondary | ICD-10-CM | POA: Insufficient documentation

## 2015-11-06 DIAGNOSIS — F17210 Nicotine dependence, cigarettes, uncomplicated: Secondary | ICD-10-CM | POA: Insufficient documentation

## 2015-11-06 DIAGNOSIS — M79631 Pain in right forearm: Secondary | ICD-10-CM

## 2015-11-06 MED ORDER — NAPROXEN 500 MG PO TABS: 500.0000 mg | ORAL_TABLET | Freq: Two times a day (BID) | ORAL | Status: DC

## 2015-11-06 NOTE — ED Notes (Signed)
MD at bedside. 

## 2015-11-06 NOTE — ED Provider Notes (Signed)
CSN: [id number]     Arrival date & time 11/06/15  [id number] History   First MD Initiated Contact with Patient 11/06/15 [phone]     Chief Complaint  Patient presents with  . Arm Pain     (Consider location/radiation/quality/duration/timing/severity/associated sxs/prior Treatment) Patient is a 44 y.o. female presenting with arm pain. The history is provided by the patient.  Arm Pain Pertinent negatives include no chest pain, no abdominal pain, no headaches and no shortness of breath.   patient with a fall from a chair on Saturday evening. Patient landed on outstretched right arm. Initially had pain around the right wrist and then later went on to develop pain in the proximal part of the forearm. Patient is concerned may be a fracture. Some pain with movement at the elbow some pain with movement of the wrist. Patient has been taking Advil at home for this.  Past Medical History  Diagnosis Date  . PONV (postoperative nausea and vomiting)   . Arthritis   . No pertinent past medical history   . GERD (gastroesophageal reflux disease)     tums prn  . Allergy   . Asthma   . Heart murmur    Past Surgical History  Procedure Laterality Date  . Diagnostic laparoscopy      x3 for endometriosis  . Appendectomy    . Shoulder arthroscopy  right  . Knee arthroscopy      right-x3  . Tendon exploration  03/11/2012    Procedure: TENDON EXPLORATION;  Surgeon: Wynonia Sours, MD;  Location: [place];  Service: Orthopedics;  Laterality: Left;  EXPLORATION OF FLEXOR TENDON LEFT INDEX FINGER     . Left hand surgery  03/11/12  . Laparoscopy  03/26/2012    Procedure: LAPAROSCOPY OPERATIVE;  Surgeon: Lovenia Kim, MD;  Location: [facility];  Service: Gynecology;  Laterality: N/A;  with ablation of endometriosis   . Mouth surgery     Family History  Problem Relation Age of Onset  . Heart disease Mother   . Diabetes Father   . Hyperlipidemia Father   . Asthma Sister   . Heart disease Daughter     Social History  Substance Use Topics  . Smoking status: Current Every Day Smoker -- 0.50 packs/day for 15 years    Types: Cigarettes  . Smokeless tobacco: Never Used  . Alcohol Use: Yes     Comment: rare   OB History    No data available     Review of Systems  Constitutional: Negative for fever.  HENT: Negative for congestion.   Eyes: Negative for redness.  Respiratory: Negative for shortness of breath.   Cardiovascular: Negative for chest pain.  Gastrointestinal: Negative for abdominal pain.  Musculoskeletal: Negative for back pain and neck pain.  Skin: Negative for wound.  Neurological: Negative for headaches.  Hematological: Does not bruise/bleed easily.  Psychiatric/Behavioral: Negative for confusion.      Allergies  Erythromycin and Vancomycin cross reactors  Home Medications   Prior to Admission medications   Medication Sig Start Date End Date Taking? Authorizing Provider  cloNIDine (CATAPRES) 0.3 MG tablet Take 1 tablet (0.3 mg total) by mouth at bedtime. 08/17/15  Yes Leandrew Koyanagi, MD  cyclobenzaprine (FLEXERIL) 10 MG tablet TAKE 1 TABLET BY MOUTH AT BEDTIME 06/28/15  Yes Leandrew Koyanagi, MD  traMADol (ULTRAM) 50 MG tablet TAKE 1 to 2 TABLETS BY MOUTH EVERY TWELVE HOURS 10/24/15  Yes Leandrew Koyanagi, MD  traZODone (DESYREL) 150  MG tablet Take 2 tablets (300 mg total) by mouth at bedtime. 06/28/15  Yes Leandrew Koyanagi, MD  naproxen (NAPROSYN) 500 MG tablet Take 1 tablet (500 mg total) by mouth 2 (two) times daily. 11/06/15   Fredia Sorrow, MD   BP 115/80 mmHg  Pulse 80  Temp(Src) 98.2 F (36.8 C) (Oral)  Resp 16  Ht 5\' 9"  (1.753 m)  Wt 180 lb (81.647 kg)  BMI 26.57 kg/m2  SpO2 98%  LMP 10/30/2015 Physical Exam  Constitutional: She is oriented to person, place, and time. She appears well-developed and well-nourished. No distress.  HENT:  Head: Normocephalic and atraumatic.  Mouth/Throat: Oropharynx is clear and moist.  Eyes: Conjunctivae  and EOM are normal. Pupils are equal, round, and reactive to light.  Neck: Normal range of motion.  Cardiovascular: Normal rate, regular rhythm and normal heart sounds.   No murmur heard. Pulmonary/Chest: Effort normal and breath sounds normal.  Abdominal: Soft. Bowel sounds are normal. There is no tenderness.  Musculoskeletal: She exhibits tenderness. She exhibits no edema.  Pain with movement at the right wrist and elbow. No swelling or deformity. No snuffbox tenderness. Mild tenderness over the proximal radius. Neurovascularly the hand on the right side is intact. Radial pulse 2+.  Neurological: She is alert and oriented to person, place, and time. No cranial nerve deficit. She exhibits normal muscle tone. Coordination normal.  Skin: No erythema.  Nursing note and vitals reviewed.   ED Course  Procedures (including critical care time) Labs Review Labs Reviewed - No data to display  Imaging Review Dg Forearm Right  11/06/2015  CLINICAL DATA:  Fall 2 days ago, right forearm pain and swelling EXAM: RIGHT FOREARM - 2 VIEW COMPARISON:  None. FINDINGS: Two views of the right forearm submitted. No acute fracture or subluxation. No radiopaque foreign body. IMPRESSION: Negative. Electronically Signed   By: Lahoma Crocker M.D.   On: 11/06/2015 11:01   I have personally reviewed and evaluated these images and lab results as part of my medical decision-making.   EKG Interpretation None      MDM   Final diagnoses:  Fall, initial encounter  Right forearm pain    Patient with a fall and injury to her right wrist forearm and elbow area on Saturday evening. Patient initially had discomfort around the wrist but now mostly discomfort is around the proximal forearm area. Patient is concerned that there may have been a fracture.  X-ray showed no evidence of any bony injury. There is no snuffbox tenderness to suggest a scaphoid injury to the wrist. Radial pulses 2+ neurovascularly intact distally to  the hands. No significant swelling or deformity. Will treat as soft tissue injury with the Naprosyn and follow-up with sports medicine if it does not improve.    Fredia Sorrow, MD 11/06/15 1124

## 2015-11-06 NOTE — ED Notes (Signed)
Reports think she sprained her right arm.  Reports she was in a chair with her child and they fell out of a chair and landed on arm.

## 2015-11-06 NOTE — Discharge Instructions (Signed)
Take the Naprosyn on a regular basis. Okay to use the arm. Follow-up with sports medicine if not improving in a week. Return for any new or worse symptoms. X-rays were negative.

## 2015-11-20 ENCOUNTER — Encounter: Payer: Self-pay | Admitting: Internal Medicine

## 2015-12-05 ENCOUNTER — Telehealth: Payer: Self-pay

## 2015-12-05 NOTE — Telephone Encounter (Signed)
[place] - Pt has a bad yeast infection.  Monistat has not worked, so can we call in diflucan for her?  Pt says she cant afford to come in due to her high deductible.  [phone]

## 2015-12-06 MED ORDER — FLUCONAZOLE 150 MG PO TABS: 150.0000 mg | ORAL_TABLET | Freq: Once | ORAL | Status: DC

## 2015-12-06 NOTE — Telephone Encounter (Signed)
Called pt and advised message from provider on their voicemail.  

## 2015-12-06 NOTE — Telephone Encounter (Signed)
Meds ordered this encounter  Medications  . fluconazole (DIFLUCAN) 150 MG tablet    Sig: Take 1 tablet (150 mg total) by mouth once. Repeat in 1 week if needed    Dispense:  2 tablet    Refill:  0

## 2015-12-18 ENCOUNTER — Other Ambulatory Visit: Payer: Self-pay | Admitting: Internal Medicine

## 2016-01-15 ENCOUNTER — Other Ambulatory Visit: Payer: Self-pay | Admitting: Internal Medicine

## 2016-01-25 LAB — HM MAMMOGRAPHY

## 2016-02-06 ENCOUNTER — Ambulatory Visit: Payer: BLUE CROSS/BLUE SHIELD | Attending: Sports Medicine | Admitting: Physical Therapy

## 2016-02-06 DIAGNOSIS — M545 Low back pain: Secondary | ICD-10-CM | POA: Insufficient documentation

## 2016-02-06 NOTE — Therapy (Signed)
[facility] [address]  [place], [place], [address] Phone: [phone]   Fax:  [phone]  Physical Therapy Evaluation  Patient Details  Name: Patricia Newton MRN: [id number] Date of Birth: [birth year] Referring Provider: Becky Sax, MD  Encounter Date: 02/06/2016      PT End of Session - 02/06/16 1332    Visit Number 1   Number of Visits 12   Date for PT Re-Evaluation 03/19/16   PT Start Time 1316   PT Stop Time 1410   PT Time Calculation (min) 54 min      Past Medical History  Diagnosis Date  . PONV (postoperative nausea and vomiting)   . Arthritis   . No pertinent past medical history   . GERD (gastroesophageal reflux disease)     tums prn  . Allergy   . Asthma   . Heart murmur     Past Surgical History  Procedure Laterality Date  . Diagnostic laparoscopy      x3 for endometriosis  . Appendectomy    . Shoulder arthroscopy  right  . Knee arthroscopy      right-x3  . Tendon exploration  03/11/2012    Procedure: TENDON EXPLORATION;  Surgeon: Wynonia Sours, MD;  Location: [facility];  Service: Orthopedics;  Laterality: Left;  EXPLORATION OF FLEXOR TENDON LEFT INDEX FINGER     . Left hand surgery  03/11/12  . Laparoscopy  03/26/2012    Procedure: LAPAROSCOPY OPERATIVE;  Surgeon: Lovenia Kim, MD;  Location: [facility];  Service: Gynecology;  Laterality: N/A;  with ablation of endometriosis   . Mouth surgery      There were no vitals filed for this visit.  Visit Diagnosis:  Bilateral low back pain, with sciatica presence unspecified      Subjective Assessment - 02/06/16 1321    Subjective pt with history of chronic LBP for past 10 years.  She reports recent increase in pain with pain extending into R hip and from R knee to foot for past 3 weeks.  States R leg feels like it swells with standing despite not actually seeing or palpating any swelling.  States is not able to move R  great toe   Patient Stated Goals decrease back pain   Currently in Pain? Yes   Pain Score 2   AVG pain 5/10, 7/10 at worst   Pain Location Back   Pain Orientation Right;Lower   Pain Descriptors / Indicators Aching;Sharp  deep ache, intermittent electric sharp pain   Pain Onset 1 to 4 weeks ago   Pain Frequency Intermittent   Aggravating Factors  prolonged standing, walking, sitting, driving   Pain Relieving Factors supine with LE elevated, medication            [facility] PT Assessment - 02/06/16 0001    Assessment   Medical Diagnosis LBP   Referring Provider Becky Sax, MD   Onset Date/Surgical Date 01/13/16   Next MD Visit 4 wks   Balance Screen   Has the patient fallen in the past 6 months No   Has the patient had a decrease in activity level because of a fear of falling?  No   Is the patient reluctant to leave their home because of a fear of falling?  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   Leisure denies regular exercise   Observation/Other Assessments  Focus on Therapeutic Outcomes (FOTO)  57% limitation   ROM / Strength   AROM / PROM / Strength Strength   Strength   Strength Assessment Site Hip;Knee;Ankle   Right/Left Hip Right   Right Hip Flexion 4+/5   Right Hip Extension 4/5   Right Hip External Rotation  4+/5   Right Hip Internal Rotation 5/5   Right Hip ABduction 4+/5   Right Hip ADduction 5/5   Right/Left Knee Right   Right Knee Flexion 4+/5   Right Knee Extension 5/5   Right/Left Ankle Right   Right Ankle Dorsiflexion 4/5   Flexibility   Soft Tissue Assessment /Muscle Length yes   Hamstrings mild tightness   Quadriceps tight   Piriformis mild tightness   Special Tests    Special Tests Lumbar   Lumbar Tests FABER test;Slump Test;Prone Knee Bend Test;Straight Leg Raise   FABER test   findings Positive   Side Right   Slump test   Findings Positive   Side Right   Straight  Leg Raise   Findings Positive   Side  Right        TODAY'S TREATMENT TherEx - Bridge 10x3" Prone SLR 10x3" each Hooklying l-spine ext stretch with towel roll 2' POE 1'           PT Education - 02/06/16 1415    Education provided Yes   Education Details initial HEP   Person(s) Educated Patient   Methods Explanation;Demonstration;Handout   Comprehension Verbalized understanding;Returned demonstration          PT Short Term Goals - 02/06/16 1523    PT SHORT TERM GOAL #1   Title pt independent with initial HEP by 02/13/16   Status New           PT Long Term Goals - 02/06/16 1523    PT LONG TERM GOAL #1   Title pt independent with advanced HEP as necessary for continued progression/symptom control by 03/19/16   Status New   PT LONG TERM GOAL #2   Title R LE 4+/5 or greater all planes without pain by 03/19/16   Status New   PT LONG TERM GOAL #3   Title pt able to sit, stand, and walk without limitation by LBP by 03/19/16   Status New   PT LONG TERM GOAL #4   Title pt able to perform work duties and participate in recreational activities without restriction by LBP by 03/19/16   Status New               Plan - 02/06/16 1514    Clinical Impression Statement pt with a few year history of lower back pain but with 3 week increase in pain and symptoms.  Historically, pain has been localized to lower back and some into B buttock but she states she has been noting pain extending from R knee into R foot.  She also c/o weakness in R great toe.  Pt's posture includes mild posterior pelvic tilt and reduced lumbar lordosis and she displays poor body mechanics with squatting to include forward wt shifting and excessive premature posterior pelvic tilt.  Her lumbar AROM is WNL into flexion but painful return from flexion.  Extension limited to 50% normal without pain.  Weakness noted in R LE vs L (L is 5/5 grossly other than Hip ABD and Ext 4+/5).  Special testing includes POS R  SLR and POS R Slump both indicating likely neural impingment which seems l-spine in nature.  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  In addition, pt with rather poor core strength and control and some symptoms seem related to stability issues in Lumbosacral area.  We will address symptoms with lumbopelvic stability along with mild extension biased treatment.    Pt will benefit from skilled therapeutic intervention in order to improve on the following deficits Pain;Postural dysfunction;Decreased strength;Improper body mechanics;Difficulty walking   Rehab Potential Good   PT Frequency 2x / week   PT Duration 6 weeks   PT Treatment/Interventions Manual techniques;Therapeutic exercise;Therapeutic activities;Taping;Traction;Neuromuscular re-education;Gait training;Functional mobility training;Electrical Stimulation;Cryotherapy;Moist Heat;Dry needling   PT Next Visit Plan lumbopelvic stability, LE nerve glides, extension bias mobility; mechanical traction either hooklying or prone with neutral pull   Consulted and Agree with Plan of Care Patient         Problem List Patient Active Problem List   Diagnosis Date Noted  . Endometriosis 09/27/2013  . TMJ (temporomandibular joint syndrome) 02/10/2013  . ANEMIA-NOS 05/26/2009  . ALLERGIC RHINITIS 05/26/2009  . PALPITATIONS 05/26/2009  . CARDIAC MURMUR 05/26/2009  . DYSPNEA 05/26/2009    Murrell Dome PT, OCS 02/06/2016, 3:26 PM  [facility] [address]  [place], [place], [address] Phone: [phone]   Fax:  [phone]  Name: Patricia Newton MRN: [id number] Date of Birth: 04/19/1971

## 2016-02-08 ENCOUNTER — Telehealth: Payer: Self-pay

## 2016-02-08 NOTE — Telephone Encounter (Signed)
Pt req. rx consultation//pt was seen at a different urgent care due to a disk problem and was prescribed an rx/// pt started physical therapy... Orthopedist nor other urgent care will fill// pt states this rx works better than the flexeril.   baclofen (LIORESAL) 10 MG tablet [id number]   [phone]

## 2016-02-09 ENCOUNTER — Ambulatory Visit: Payer: BLUE CROSS/BLUE SHIELD | Admitting: Physical Therapy

## 2016-02-11 ENCOUNTER — Other Ambulatory Visit: Payer: Self-pay | Admitting: Internal Medicine

## 2016-02-12 NOTE — Telephone Encounter (Signed)
Pt CB and will try to get in to see Dr Jennette Kettle (possible opening at 104), or tomorrow at walk in.

## 2016-02-12 NOTE — Telephone Encounter (Signed)
Pt is overdue for f/up with Dr Laney Pastor. Called and LMOM that we can not RF a Rx written by another office, but would be happy to see her for eval and f/up.

## 2016-02-14 ENCOUNTER — Encounter: Payer: Self-pay | Admitting: Internal Medicine

## 2016-02-14 ENCOUNTER — Ambulatory Visit: Payer: BLUE CROSS/BLUE SHIELD | Admitting: Physical Therapy

## 2016-02-14 ENCOUNTER — Ambulatory Visit (INDEPENDENT_AMBULATORY_CARE_PROVIDER_SITE_OTHER): Payer: BLUE CROSS/BLUE SHIELD | Admitting: Internal Medicine

## 2016-02-14 VITALS — BP 129/80 | HR 97 | Temp 98.9°F | Resp 16 | Ht 69.0 in | Wt 195.0 lb

## 2016-02-14 DIAGNOSIS — M545 Low back pain: Secondary | ICD-10-CM | POA: Diagnosis not present

## 2016-02-14 DIAGNOSIS — M25551 Pain in right hip: Secondary | ICD-10-CM

## 2016-02-14 DIAGNOSIS — M5416 Radiculopathy, lumbar region: Secondary | ICD-10-CM

## 2016-02-14 DIAGNOSIS — M25559 Pain in unspecified hip: Secondary | ICD-10-CM | POA: Insufficient documentation

## 2016-02-14 DIAGNOSIS — G5791 Unspecified mononeuropathy of right lower limb: Secondary | ICD-10-CM | POA: Diagnosis not present

## 2016-02-14 MED ORDER — GABAPENTIN 300 MG PO CAPS: 300.0000 mg | ORAL_CAPSULE | Freq: Every day | ORAL | Status: DC

## 2016-02-14 MED ORDER — TRAMADOL HCL 50 MG PO TABS: ORAL_TABLET | ORAL | Status: DC

## 2016-02-14 MED ORDER — TRAMADOL HCL 50 MG PO TABS: 50.0000 mg | ORAL_TABLET | Freq: Four times a day (QID) | ORAL | Status: DC | PRN

## 2016-02-14 MED ORDER — BACLOFEN 10 MG PO TABS: 10.0000 mg | ORAL_TABLET | Freq: Three times a day (TID) | ORAL | Status: DC

## 2016-02-14 NOTE — Progress Notes (Signed)
Subjective:    Patient ID: Patricia Newton, female    DOB: 28-Feb-1971, 45 y.o.   MRN: [id number]  HPIlast ov here 7/16--- continues to have significant trouble with pain in her low back, right SI area, right buttock posterior thigh/leg and foot. She believes she has more weakness in the foot in terms of lifting her foot.  01/24/16 [facility]=Visit Diagnoses : Piriformis muscle pain - Primary  Relevant Medications  ketorolac (TORADOL) injection 60 mg (Completed) (Start on 01/24/2016 10:00 AM)  Other Relevant Orders  Ambulatory referral to Orthopedic Surgery  F/U 1 w later= Flex/tram no longer working so they chg to pred toradol baclofen 2/6  Then Visit w/[facility]=Lumbar spine: Nontender to palpation of the lumbar spine. Mildly tender to palpation of the sciatic distribution on the right side in the SI joint. Distally she has some subjective numbness on the lateral aspect of her leg and foot towards the top portion of her foot and superficial peroneal distribution. Good strength for quad, dorsiflexor, EHL, plantar flexor. Her FHL tendon is slightly weak in flexion compared to the contralateral side. Otherwise sensory intact light touch. Her capillary refills less than 2 seconds. Pulses 2+ DP and PT.  LS xray minimal chg--- advised conservative therapy  they prescribed oxycodone both places but refused call for refills and said to call us  Now in rehab--see notes  L hip to back to R sciatica with weakness R great toe and decr sens R foot dorsum   Review of Systems Her activity level remains unchanged She is aware of some weight gain She is frustrated with the length of time she has had no improvement No change in cardiac or pulmonary status and is asymptomatic No genitourinary or gastrointestinal symptoms Last labs indicate resolution of her anemia    Objective:   Physical Exam BP 129/80 mmHg  Pulse 97  Temp(Src) 98.9 F (37.2 C)  Resp 16  Ht 5\' 9"  (1.753 m)  Wt 195 lb  (88.451 kg)  BMI 28.78 kg/m2 Wt Readings from Last 3 Encounters:  02/14/16 195 lb (88.451 kg)  11/06/15 180 lb (81.647 kg)  06/28/15 183 lb (83.008 kg)   No acute distress PERRLA with EOMs conjugate Heart regular Respiratory rate normal Gait normal except favors right side Lumbosacral area tender over right lower and SI The right hip has a decreased external rotation compared to the left Straight leg raise is positive on the right 45 She cannot flex the first toe against resistance and cannot flex the first toe without resistance even in the dorsal direction She has mild decrease in sensation over the plantar and dorsal aspects compared to the left Patellar reflexes are symmetrical       Assessment & Plan:  Lumbar radicular syndrome - Plan: MR Lumbar Spine Wo Contrast  Neuropathy of right foot  Hip pain, right  Weight gain 12 pounds is not helping the situation   This represents an advancing neuropathy probably secondary to nerve compression in the lumbar area Etiology of the hip dysfunction is not yet clear MRI indicated see if surgical option as needed before there is further neurological deficit She would prefer a different orthopedics if that referral is necessary Continue physical therapy for now Meds ordered this encounter  Medications  . gabapentin (NEURONTIN) 300 MG capsule    Sig: Take 1 capsule (300 mg total) by mouth at bedtime.    Dispense:  30 capsule    Refill:  0  . baclofen (LIORESAL) 10  MG tablet    Sig: Take 1 tablet (10 mg total) by mouth 3 (three) times daily.    Dispense:  90 each    Refill:  0  . traMADol (ULTRAM) 50 MG tablet    Sig: Take 1-2 tablets (50-100 mg total) by mouth every 6 (six) hours as needed.    Dispense:  120 tablet    Refill:  1

## 2016-02-14 NOTE — Therapy (Signed)
[facility] [address]  [place], [place], [address] Phone: [phone]   Fax:  [phone]  Physical Therapy Treatment  Patient Details  Name: Patricia Newton MRN: [id number] Date of Birth: [birth year] Referring Provider: Becky Sax, MD  Encounter Date: 02/14/2016      PT End of Session - 02/14/16 0937    Visit Number 2   Number of Visits 12   Date for PT Re-Evaluation 03/19/16   PT Start Time 0936  pt late   PT Stop Time 1026   PT Time Calculation (min) 50 min      Past Medical History  Diagnosis Date  . PONV (postoperative nausea and vomiting)   . Arthritis   . No pertinent past medical history   . GERD (gastroesophageal reflux disease)     tums prn  . Allergy   . Asthma   . Heart murmur     Past Surgical History  Procedure Laterality Date  . Diagnostic laparoscopy      x3 for endometriosis  . Appendectomy    . Shoulder arthroscopy  right  . Knee arthroscopy      right-x3  . Tendon exploration  03/11/2012    Procedure: TENDON EXPLORATION;  Surgeon: Wynonia Sours, MD;  Location: [facility];  Service: Orthopedics;  Laterality: Left;  EXPLORATION OF FLEXOR TENDON LEFT INDEX FINGER     . Left hand surgery  03/11/12  . Laparoscopy  03/26/2012    Procedure: LAPAROSCOPY OPERATIVE;  Surgeon: Lovenia Kim, MD;  Location: [facility];  Service: Gynecology;  Laterality: N/A;  with ablation of endometriosis   . Mouth surgery      There were no vitals filed for this visit.  Visit Diagnosis:  Bilateral low back pain, with sciatica presence unspecified      Subjective Assessment - 02/14/16 0937    Subjective States hasn't been performing HEP past few days due to being ill.  States has been noting more pain on L hip lower back than R since her initial eval.   Currently in Pain? Yes   Pain Score 3    Pain Location Back   Pain Orientation Left;Right;Lower  L greater than R today   Pain  Radiating Towards pain extends B buttock, R LE knee to foot, some in proximal posterior R thigh; n/t top of R foot           TODAY'S TREATMENT Manual - pt prone long axis l-spine distraction, B UPA grade 2 throughout l-spine followed by grade 2 CPA throughout l-spine; supine R LE nerve glides (quite tender)  TherEx - POE 2' Bridge 8x (stopped due to increasing LBP R>L) Stretch B HS, Piri, SKTC Hooklying B Hip ABD Blue TB 10x3" Bridge with Blue TB at knees 6x (stopped due to LBP) Prone Hip Extension 5x each   Mechanical Traction - Hooklying, Neutral pull, 60"/20", 50#/25#, 12'          PT Education - 02/14/16 1023    Education provided Yes   Education Details HEP update   Person(s) Educated Patient   Methods Explanation;Demonstration;Handout   Comprehension Verbalized understanding;Returned demonstration          PT Short Term Goals - 02/14/16 1204    PT SHORT TERM GOAL #1   Title pt independent with initial HEP by 02/13/16   Status On-going           PT Long Term Goals - 02/14/16 1204  PT LONG TERM GOAL #1   Title pt independent with advanced HEP as necessary for continued progression/symptom control by 03/19/16   Status On-going   PT LONG TERM GOAL #2   Title R LE 4+/5 or greater all planes without pain by 03/19/16   Status On-going   PT LONG TERM GOAL #3   Title pt able to sit, stand, and walk without limitation by LBP by 03/19/16   Status On-going   PT LONG TERM GOAL #4   Title pt able to perform work duties and participate in recreational activities without restriction by LBP by 03/19/16   Status On-going               Plan - 02/14/16 1201    Clinical Impression Statement pt's first treatment today.  She hasn't been performing HEP lately due to illness.  Notes radicular pain in R LE today but this was abolished during extension bias manual therapy today.  Initial traction treatment today well tolerated with pain noting immediate benefit  initially but then some ache.  S/S seem mild HNP with nerve root impingment along with possible pelvic stability issue.   PT Next Visit Plan lumbopelvic stability, LE nerve glides, extension bias mobility; mechanical traction either hooklying or prone with neutral pull; trial taping next treatment   Consulted and Agree with Plan of Care Patient        Problem List Patient Active Problem List   Diagnosis Date Noted  . Endometriosis 09/27/2013  . TMJ (temporomandibular joint syndrome) 02/10/2013  . ANEMIA-NOS 05/26/2009  . ALLERGIC RHINITIS 05/26/2009  . PALPITATIONS 05/26/2009  . CARDIAC MURMUR 05/26/2009  . DYSPNEA 05/26/2009    Devron Cohick PT, OCS 02/14/2016, 12:05 PM  [facility] [address]  [place], [place], [address] Phone: [phone]   Fax:  [phone]  Name: Patricia Newton MRN: [id number] Date of Birth: Jun 09, 1971

## 2016-02-15 ENCOUNTER — Ambulatory Visit: Payer: BLUE CROSS/BLUE SHIELD

## 2016-02-15 DIAGNOSIS — M545 Low back pain: Secondary | ICD-10-CM

## 2016-02-15 NOTE — Therapy (Signed)
[facility] [address]  [place], [place], [address] Phone: [phone]   Fax:  [phone]  Physical Therapy Treatment  Patient Details  Name: Patricia Newton MRN: [id number] Date of Birth: March 22, 1971 Referring Provider: Becky Sax, MD  Encounter Date: 02/15/2016      PT End of Session - 02/15/16 1104    Visit Number 3   Number of Visits 12   Date for PT Re-Evaluation 03/19/16   PT Start Time 1104   PT Stop Time 1202   PT Time Calculation (min) 58 min   Activity Tolerance Patient tolerated treatment well   Behavior During Therapy [facility] for tasks assessed/performed      Past Medical History  Diagnosis Date  . PONV (postoperative nausea and vomiting)   . Arthritis   . No pertinent past medical history   . GERD (gastroesophageal reflux disease)     tums prn  . Allergy   . Asthma   . Heart murmur     Past Surgical History  Procedure Laterality Date  . Diagnostic laparoscopy      x3 for endometriosis  . Appendectomy    . Shoulder arthroscopy  right  . Knee arthroscopy      right-x3  . Tendon exploration  03/11/2012    Procedure: TENDON EXPLORATION;  Surgeon: Wynonia Sours, MD;  Location: [place];  Service: Orthopedics;  Laterality: Left;  EXPLORATION OF FLEXOR TENDON LEFT INDEX FINGER     . Left hand surgery  03/11/12  . Laparoscopy  03/26/2012    Procedure: LAPAROSCOPY OPERATIVE;  Surgeon: Lovenia Kim, MD;  Location: [facility];  Service: Gynecology;  Laterality: N/A;  with ablation of endometriosis   . Mouth surgery      There were no vitals filed for this visit.  Visit Diagnosis:  Bilateral low back pain, with sciatica presence unspecified      Subjective Assessment - 02/15/16 1110    Subjective Pt. states,"I saw my primary care doctor this morning, he wants an MRI, also wants me to take baclefen again"   Currently in Pain? Yes   Pain Score 3    Pain Location Back   Pain Orientation Left;Right;Lower   Pain Descriptors / Indicators Aching;Sharp   Pain Onset 1 to 4 weeks ago   Aggravating Factors  prolonged standing.       TODAY'S TREATMENT  Bridge 5x (stopped due to increasing LBP R>L) Stretch B HS, Piri, SKTC each side  Hooklying B Hip ABD Blue TB 10x3" Bridge with Blue TB at knees 6x (stopped due to LBP) ABD marching in supine x 10 reps each  Prone Hip Extension 5x each   Mechanical Traction - Hooklying, Neutral pull, 60"/20", 50#/25#, 12'          PT Education - 02/14/16 1023    Education provided Yes   Education Details HEP update   Person(s) Educated Patient   Methods Explanation;Demonstration;Handout   Comprehension Verbalized understanding;Returned demonstration          PT Short Term Goals - 02/14/16 1204    PT SHORT TERM GOAL #1   Title pt independent with initial HEP by 02/13/16   Status On-going          PT Long Term Goals - 02/14/16 1204    PT LONG TERM GOAL #1   Title pt independent with advanced HEP as necessary for continued progression/symptom control by 03/19/16   Status On-going  PT LONG TERM GOAL #2   Title R LE 4+/5 or greater all planes without pain by 03/19/16   Status On-going   PT LONG TERM GOAL #3   Title pt able to sit, stand, and walk without limitation by LBP by 03/19/16   Status On-going   PT LONG TERM GOAL #4   Title pt able to perform work duties and participate in recreational activities without restriction by LBP by 03/19/16   Status On-going          Plan - 02/15/16 1159    Clinical Impression Statement Pt. reports initial pain relief from mechanical traction last session however LBP returning later that evening.  Today's treatment focused on lumbopelvic stability activities with LE stretching and mechanical traction.  Pt. reported continued pain relief from mechanical traction in today's session.     PT Next Visit Plan lumbopelvic stability, LE nerve glides, extension bias mobility;  mechanical traction either hooklying or prone with neutral pull; trial taping next treatment   Consulted and Agree with Plan of Care Patient      Problem List Patient Active Problem List   Diagnosis Date Noted  . 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 Hip pain 02/14/2016  . Endometriosis 09/27/2013  . TMJ (temporomandibular joint syndrome) 02/10/2013  . ANEMIA-NOS 05/26/2009  . ALLERGIC RHINITIS 05/26/2009  . PALPITATIONS 05/26/2009  . CARDIAC MURMUR 05/26/2009  . DYSPNEA 05/26/2009    Bess Harvest, PTA 02/15/2016, 12:02 PM  [facility] [address]  [place], [place], [address] Phone: [phone]   Fax:  [phone]  Name: Patricia Newton MRN: [id number] Date of Birth: June 12, 1971

## 2016-02-20 ENCOUNTER — Ambulatory Visit: Payer: BLUE CROSS/BLUE SHIELD

## 2016-02-20 DIAGNOSIS — M545 Low back pain: Secondary | ICD-10-CM | POA: Diagnosis not present

## 2016-02-20 NOTE — Therapy (Signed)
[facility] [address]  [place], [place], [address] Phone: [phone]   Fax:  [phone]  Physical Therapy Treatment  Patient Details  Name: Patricia Newton MRN: [id number] Date of Birth: 1971-11-25 Referring Provider: Becky Sax, MD  Encounter Date: 02/20/2016      PT End of Session - 02/20/16 0851    Visit Number 4   Number of Visits 12   Date for PT Re-Evaluation 03/19/16   PT Start Time 0848   PT Stop Time 0932   PT Time Calculation (min) 44 min   Activity Tolerance Patient tolerated treatment well   Behavior During Therapy [facility] for tasks assessed/performed      Past Medical History  Diagnosis Date  . PONV (postoperative nausea and vomiting)   . Arthritis   . No pertinent past medical history   . GERD (gastroesophageal reflux disease)     tums prn  . Allergy   . Asthma   . Heart murmur     Past Surgical History  Procedure Laterality Date  . Diagnostic laparoscopy      x3 for endometriosis  . Appendectomy    . Shoulder arthroscopy  right  . Knee arthroscopy      right-x3  . Tendon exploration  03/11/2012    Procedure: TENDON EXPLORATION;  Surgeon: Wynonia Sours, MD;  Location: [facility];  Service: Orthopedics;  Laterality: Left;  EXPLORATION OF FLEXOR TENDON LEFT INDEX FINGER     . Left hand surgery  03/11/12  . Laparoscopy  03/26/2012    Procedure: LAPAROSCOPY OPERATIVE;  Surgeon: Lovenia Kim, MD;  Location: [facility];  Service: Gynecology;  Laterality: N/A;  with ablation of endometriosis   . Mouth surgery      There were no vitals filed for this visit.  Visit Diagnosis:  Bilateral low back pain, with sciatica presence unspecified      Subjective Assessment - 02/20/16 1034    Subjective Pt. states mechanical traction wasn't relieving pain from last visit.  Pt. reports reports B LB pain 3/10 today with R hip "soreness" from last therapy visit (2/28).  Pt. also  reports being on her hands and knees helping mother clean on Sunday.  Pt. to have MRI 3/7.     Patient Stated Goals decrease back pain   Currently in Pain? Yes   Pain Score 3    Pain Location Back   Pain Orientation Right;Left;Lower   Pain Descriptors / Indicators Aching;Sharp   Pain Radiating Towards Pain extends R buttocks to R foot along lateral portion of LE.   Pain Frequency Intermittent   Aggravating Factors  prolonged standing   Pain Relieving Factors Supine with LE elevated, medication     Multiple Pain Sites No     TODAY'S TREATMENT Stretch B HS, Piri, SKTC each side  Hook lying abdominal bracing 5' hold x 10  ABD marching in supine x 10 reps each with focus on ab bracing Bridge with Blue TB at knees with isometric hold / ER 3 x 5 reps Hooklying B Hip ABD Blue TB 10x3" Prone Hip Extension 5x each  HEP review: - hook-lying abdominal bracing  - prone extension stretch on elbows - prone push ups stretch  - bridging  * pt. will benefit from further instruction        PT Education - 02/20/16 1058    Education provided Yes   Person(s) Educated Patient   Methods Explanation;Demonstration;Verbal cues;Tactile cues;Handout  Comprehension Verbalized understanding;Need further instruction;Tactile cues required;Verbal cues required;Returned demonstration          PT Short Term Goals - 02/14/16 1204    PT SHORT TERM GOAL #1   Title pt independent with initial HEP by 02/13/16   Status On-going           PT Long Term Goals - 02/14/16 1204    PT LONG TERM GOAL #1   Title pt independent with advanced HEP as necessary for continued progression/symptom control by 03/19/16   Status On-going   PT LONG TERM GOAL #2   Title R LE 4+/5 or greater all planes without pain by 03/19/16   Status On-going   PT LONG TERM GOAL #3   Title pt able to sit, stand, and walk without limitation by LBP by 03/19/16   Status On-going   PT LONG TERM GOAL #4   Title pt able to perform  work duties and participate in recreational activities without restriction by LBP by 03/19/16   Status On-going          Plan - 02/20/16 1039    Clinical Impression Statement Pt. reports no pain relief from mechanical traction last session; no mechanical traction performed today.  Today's treatment focused on lumbopelvic strengthening activities with LE / hip stretching to decrease pain.  Pt. continues to report decreased pain with LE / hip stretching stating, "I feel it release".   Pt. to have an MRI on 3/7.     PT Next Visit Plan lumbopelvic stability, LE nerve glides, extension bias mobility; mechanical traction either hooklying or prone with neutral pull; trial taping next treatment   Consulted and Agree with Plan of Care Patient     Problem List Patient Active Problem List   Diagnosis Date Noted  . 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 Hip pain 02/14/2016  . Endometriosis 09/27/2013  . TMJ (temporomandibular joint syndrome) 02/10/2013  . ANEMIA-NOS 05/26/2009  . ALLERGIC RHINITIS 05/26/2009  . PALPITATIONS 05/26/2009  . CARDIAC MURMUR 05/26/2009  . DYSPNEA 05/26/2009    Patricia Newton, Patricia Newton 02/20/2016, 10:58 AM  [facility] [address]  [place], [place], [address] Phone: [phone]   Fax:  [phone]  Name: Patricia Newton MRN: [id number] Date of Birth: 1971-07-03

## 2016-02-22 ENCOUNTER — Ambulatory Visit: Payer: BLUE CROSS/BLUE SHIELD | Admitting: Physical Therapy

## 2016-02-26 ENCOUNTER — Ambulatory Visit: Payer: BLUE CROSS/BLUE SHIELD

## 2016-02-27 ENCOUNTER — Ambulatory Visit
Admission: RE | Admit: 2016-02-27 | Discharge: 2016-02-27 | Disposition: A | Discharge status: Home / Self Care | Payer: BLUE CROSS/BLUE SHIELD | Source: Ambulatory Visit | Attending: Internal Medicine | Admitting: Internal Medicine

## 2016-02-27 DIAGNOSIS — M5416 Radiculopathy, lumbar region: Secondary | ICD-10-CM

## 2016-02-29 ENCOUNTER — Telehealth: Payer: Self-pay

## 2016-02-29 NOTE — Telephone Encounter (Signed)
I thought this had been sent: 1. Shallow right paracentral disc protrusion at L5-S1 contacting the transiting right S1 nerve root in the right lateral recess. This could certainly produce the patient's right lower extremity radicular symptoms. 2. Shallow broad-based disc protrusion with associated annular fissure at the right neural foramen at L4-5. The annular fissure closely approximates the exiting right L4 nerve root without frank neural impingement. This could also potentially contribute to the patient's right lower extremity radicular symptoms as well. 3. Mild bilateral facet arthrosis at L4-5 and L5-S1.  So she has something that can be fixed--can we refer her to neurosurgery to evaluate???or would she have a preference for where to go??

## 2016-02-29 NOTE — Telephone Encounter (Signed)
Pt is looking for the results of the MRI she had recently   Best number [phone]

## 2016-03-01 ENCOUNTER — Ambulatory Visit: Payer: BLUE CROSS/BLUE SHIELD | Attending: Sports Medicine | Admitting: Physical Therapy

## 2016-03-01 ENCOUNTER — Telehealth: Payer: Self-pay

## 2016-03-01 DIAGNOSIS — M5126 Other intervertebral disc displacement, lumbar region: Secondary | ICD-10-CM

## 2016-03-01 DIAGNOSIS — M545 Low back pain: Secondary | ICD-10-CM | POA: Insufficient documentation

## 2016-03-01 MED ORDER — GABAPENTIN 300 MG PO CAPS: 300.0000 mg | ORAL_CAPSULE | Freq: Every day | ORAL | Status: DC

## 2016-03-01 NOTE — Telephone Encounter (Signed)
Patient is calling with a question about taking gabapentin. She states that it's not helping very well with the nerve pain and wants to know if she can take it throughout the day instead of only at night. Patient will need a refill if the provider advises that she takes the medication throughout the day. Please advise! [phone]

## 2016-03-01 NOTE — Therapy (Addendum)
[facility] [address]  [place], [place], [address] Phone: [phone]   Fax:  [phone]  Physical Therapy Treatment  Patient Details  Name: Patricia Newton MRN: [id number] Date of Birth: 12-May-1971 Referring Provider: Becky Sax, MD  Encounter Date: 03/01/2016      PT End of Session - 03/01/16 0854    Visit Number 5   Number of Visits 12   Date for PT Re-Evaluation 03/19/16   PT Start Time 0805   PT Stop Time 8916   PT Time Calculation (min) 49 min      Past Medical History  Diagnosis Date  . PONV (postoperative nausea and vomiting)   . Arthritis   . No pertinent past medical history   . GERD (gastroesophageal reflux disease)     tums prn  . Allergy   . Asthma   . Heart murmur     Past Surgical History  Procedure Laterality Date  . Diagnostic laparoscopy      x3 for endometriosis  . Appendectomy    . Shoulder arthroscopy  right  . Knee arthroscopy      right-x3  . Tendon exploration  03/11/2012    Procedure: TENDON EXPLORATION;  Surgeon: Patricia Sours, MD;  Location: [facility];  Service: Orthopedics;  Laterality: Left;  EXPLORATION OF FLEXOR TENDON LEFT INDEX FINGER     . Left hand surgery  03/11/12  . Laparoscopy  03/26/2012    Procedure: LAPAROSCOPY OPERATIVE;  Surgeon: Patricia Kim, MD;  Location: [facility];  Service: Gynecology;  Laterality: N/A;  with ablation of endometriosis   . Mouth surgery      There were no vitals filed for this visit.  Visit Diagnosis:  Bilateral low back pain, with sciatica presence unspecified      Subjective Assessment - 03/01/16 1106    Subjective Pt arrives today stating she is always sore after PT and was thinking she should decrease frequency of PT.  We reviewed findings of MRI today and pt with better understanding of importance of posture/body mechanics as well as roll PT can play in assisting with improving her symptoms following  this discussion.   Currently in Pain? Yes   Pain Score 4    Pain Location Back   Pain Orientation Lower            TODAY'S TREATMENT Reviewed MRI with pt and prolonged discussion regarding POC and PT's role in improving symptom  TherAct- Explained and demonstrated posture and body mechanics: maintain neutral spine with sitting, standing, movement; use lumbar support with sitting and avoid prolonged sitting in general; limit forward bending and squatting; avoid lifting anything greater than 10-20#, etc  TherEx - Prone to POE 2' Mild Prayer with Press-up as tolerated combo 8x POE 2' Prone Hip Extension 5x each Quadruped LE Raise with neutral trunk 5x each HEP instruction  Mechanical Traction - l-spine, prone, Neutral pull, 60"/20", 50#/25#, 5' (stopped early due to pt c/o increasing LBP)         PT Education - 03/01/16 1117    Education provided Yes   Education Details HEP update - replaces prior HEP   Person(s) Educated Patient   Methods Explanation;Demonstration;Handout   Comprehension Verbalized understanding;Returned demonstration          PT Short Term Goals - 03/01/16 1125    PT SHORT TERM GOAL #1   Title pt independent with initial HEP   Status Achieved  PT Long Term Goals - 02/14/16 1204    PT LONG TERM GOAL #1   Title pt independent with advanced HEP as necessary for continued progression/symptom control by 03/19/16   Status On-going   PT LONG TERM GOAL #2   Title R LE 4+/5 or greater all planes without pain by 03/19/16   Status On-going   PT LONG TERM GOAL #3   Title pt able to sit, stand, and walk without limitation by LBP by 03/19/16   Status On-going   PT LONG TERM GOAL #4   Title pt able to perform work duties and participate in recreational activities without restriction by LBP by 03/19/16   Status On-going               Plan - 03/01/16 1119    Clinical Impression Statement pt with c/o increased pain for days following  each treatment.  Reviewing MRI today notes mild HNP with R S1 nerve root contact but nothing noted on L other than B facet arthrosis.  Pt's c/o L LE pain seems likely more of a lumbopelvic stability issue and disc issue(s) contribute to LBP and R radicular symptoms.  Due to her c/o increased pain with each treatment, I decreased her stability exercises focusing only on extension biase exercises and stability training today.  Exercises well tolerated then went to traction and after 5-6 minutes she began noting increasing LBP so traction stopped.  Table was set for intermittent rather than static, may try static pull next treatment to see if better tolerated.   PT Next Visit Plan try l-spine traction prone with 5-8' static pull; continue with extension bias treatment and avoiding flexion activities; LE nerve glides; lumbar and lumbopelvic stability as able.  Possible taping?   Consulted and Agree with Plan of Care Patient        Problem List Patient Active Problem List   Diagnosis Date Noted  . 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 Hip pain 02/14/2016  . Endometriosis 09/27/2013  . TMJ (temporomandibular joint syndrome) 02/10/2013  . ANEMIA-NOS 05/26/2009  . ALLERGIC RHINITIS 05/26/2009  . PALPITATIONS 05/26/2009  . CARDIAC MURMUR 05/26/2009  . DYSPNEA 05/26/2009    Patricia Newton PT, OCS 03/01/2016, 11:35 AM  [facility] [address]  [place], [place], [address] Phone: [phone]   Fax:  [phone]  Name: Patricia Newton MRN: [id number] Date of Birth: December 23, 1971   PHYSICAL THERAPY DISCHARGE SUMMARY  Visits from Start of Care: 5  Current functional level related to goals / functional outcomes: Patricia Newton was last seen on 03/01/16 for treatment of LBP.  She made little to no progress with OPPT in her 5 visits and did not return our calls since her last treatment so she is being  discharged from our care at this time.   Remaining deficits: LBP   Education / Equipment: HEP and Gaffer Plan: Patient agrees to discharge.  Patient goals were not met. Patient is being discharged due to not returning since the last visit.  ?????       Patricia Newton PT, OCS 05/15/2016 7:49 AM

## 2016-03-01 NOTE — Telephone Encounter (Signed)
She can indeed increase the gabapentin, but it does cause drowsiness.  She can add a dose in the morning, and if she tolerates that, can add another dose in the mid-day.  If it's too sedating to take during the day, she can take 2 at bedtime, and if she tolerates that, she can take 3 at bedtime.  Meds ordered this encounter  Medications  . gabapentin (NEURONTIN) 300 MG capsule    Sig: Take 1 capsule (300 mg total) by mouth at bedtime.    Dispense:  90 capsule    Refill:  0    Order Specific Question:  Supervising Provider    Answer:  DOOLITTLE, ROBERT P [id number]

## 2016-03-04 ENCOUNTER — Ambulatory Visit: Payer: BLUE CROSS/BLUE SHIELD

## 2016-03-05 ENCOUNTER — Encounter: Payer: Self-pay | Admitting: Family Medicine

## 2016-03-05 MED ORDER — GABAPENTIN 300 MG PO CAPS: 300.0000 mg | ORAL_CAPSULE | Freq: Three times a day (TID) | ORAL | Status: DC

## 2016-03-05 NOTE — Telephone Encounter (Signed)
Advised pt. I had to change to TID for insurance to pay for it.

## 2016-03-07 ENCOUNTER — Ambulatory Visit: Payer: BLUE CROSS/BLUE SHIELD | Admitting: Physical Therapy

## 2016-03-08 ENCOUNTER — Other Ambulatory Visit: Payer: Self-pay | Admitting: Internal Medicine

## 2016-03-09 NOTE — Telephone Encounter (Signed)
Patient is waiting for appointment will follow up with doolittle next week.

## 2016-03-12 ENCOUNTER — Other Ambulatory Visit: Payer: Self-pay

## 2016-03-12 DIAGNOSIS — M5126 Other intervertebral disc displacement, lumbar region: Secondary | ICD-10-CM

## 2016-03-13 ENCOUNTER — Telehealth: Payer: Self-pay

## 2016-03-13 MED ORDER — TRAMADOL HCL 50 MG PO TABS: 50.0000 mg | ORAL_TABLET | Freq: Four times a day (QID) | ORAL | Status: AC | PRN

## 2016-03-13 NOTE — Telephone Encounter (Signed)
Is She continuing physical therapy, I only see one visit in the chart? If so is it helping? Meds refill I'll see what we can do with Dr. Ellene Route but doubt that his schedule has much flexibility Meds ordered this encounter  Medications  . traMADol (ULTRAM) 50 MG tablet    Sig: Take 1-2 tablets (50-100 mg total) by mouth every 6 (six) hours as needed.    Dispense:  120 tablet    Refill:  1

## 2016-03-13 NOTE — Telephone Encounter (Signed)
Patient is requesting a refill of traMADol (ULTRAM) 50 MG tablet.  She also is requesting for Dr Laney Pastor to contact Dr Clarice Pole office and ask if she can be seen within the next week or so.  She states that Dr Ellene Route is booked out to May, but her pain has increased, so she would like to see if we can get her in with him quicker.  She specifically wants to see Dr Ellene Route for her problems.  CB#: [phone]

## 2016-03-14 ENCOUNTER — Other Ambulatory Visit: Payer: Self-pay | Admitting: Internal Medicine

## 2016-03-14 NOTE — Telephone Encounter (Signed)
Pamala Hurry called in [place].

## 2016-03-14 NOTE — Telephone Encounter (Signed)
PT called to check the status of req. She states physical therapy (went for about 3wk) caused increased pain and decided that neurosurgery should evaluate her before continuing...shared the rest of details noted below.Marland Kitchen

## 2016-03-18 NOTE — Telephone Encounter (Signed)
This is only game in town---would she prefer [facility], [facility]???

## 2016-03-18 NOTE — Telephone Encounter (Signed)
Pt called in to check on request of Dr.Dollittle speaking with Dr. Ellene Route to get her an earlier appointment. I informed her that on 3/22 Dr. Laney Pastor stated that he would see what he could do but that didn't satisfy patient. There is nothing that referrals can do about this patient and her request. I spoke with [facility] myself and they explained that Doctors Elsner, Vertell Limber & Arnoldo Morale are all booked out until May. They placed her with Dr. Richardean Chimera and patient refused the service. She keeps complaining about being in so much pain but she refused a appointment that was available to her. She wants Dr. Laney Pastor to recommend her to another Doctor that he feels would be a good fit for her please.

## 2016-03-19 ENCOUNTER — Encounter: Payer: Self-pay | Admitting: Family Medicine

## 2016-03-19 NOTE — Telephone Encounter (Signed)
Spoke with pt, she is ok with going [facility]. Is there anyone in particular Dr. Laney Pastor?

## 2016-03-22 NOTE — Telephone Encounter (Signed)
Spoke with the patient.  She now wants to try Dr Patrice Paradise at [facility].  I left a message at their office to get their estimated appointment outlook, as the patient states that she needs an appointment asap.  Have not gotten a return call at this time.

## 2016-03-26 ENCOUNTER — Telehealth: Payer: Self-pay

## 2016-03-26 NOTE — Telephone Encounter (Signed)
Pharmacist called to ok prescription filled early.   Called patient she states [place] and is unable to fill while on vacation.  Also states she is making an appointment with Spine specialist today.     Spoke with Patricia Newton ok'd to fill but a note is to be put in to not be able to fill early.  Patient was informed and Pharmacist .  Both understood

## 2016-03-26 NOTE — Telephone Encounter (Signed)
Pt's last Rx was on 3/22 and if taken at max dose would be due tomorrow 4/5. Can we OK an early RF? She should have one on file at pharm.

## 2016-03-26 NOTE — Telephone Encounter (Signed)
Pt is leaving town at 2 today and needs an early refill on her tramadal-she has enough for a week but will run out while on vacation  Best number [phone]

## 2016-03-27 ENCOUNTER — Encounter: Payer: Self-pay | Admitting: Internal Medicine

## 2016-03-29 NOTE — Telephone Encounter (Signed)
Referral was submitted to [facility].  The patient was scheduled for an appointment later this month with Dr Sherlyn Lick.

## 2016-04-01 ENCOUNTER — Other Ambulatory Visit: Payer: Self-pay | Admitting: Internal Medicine

## 2016-04-01 ENCOUNTER — Telehealth: Payer: Self-pay

## 2016-04-01 ENCOUNTER — Other Ambulatory Visit: Payer: Self-pay | Admitting: Physician Assistant

## 2016-04-01 NOTE — Telephone Encounter (Signed)
Dr. Laney Pastor    Patient left VM in medical records requesting her MRI Results from the neurologist   [phone]

## 2016-04-02 ENCOUNTER — Other Ambulatory Visit: Payer: Self-pay

## 2016-04-02 MED ORDER — GABAPENTIN 300 MG PO CAPS: ORAL_CAPSULE | ORAL | Status: DC

## 2016-04-02 NOTE — Telephone Encounter (Signed)
Please review

## 2016-04-03 NOTE — Telephone Encounter (Signed)
Sure If needs disc will have to go to place it was done

## 2016-04-04 NOTE — Telephone Encounter (Signed)
What should I tell her about the results.

## 2016-04-04 NOTE — Telephone Encounter (Signed)
Oh---i thought she already knew---of course tell her all--it;'s good news--there will be an answer for her pain

## 2016-04-05 ENCOUNTER — Other Ambulatory Visit: Payer: Self-pay | Admitting: Internal Medicine

## 2016-04-05 NOTE — Telephone Encounter (Signed)
Spoke with pt, advised her to get the disk from [facility].

## 2016-04-09 ENCOUNTER — Other Ambulatory Visit: Payer: Self-pay | Admitting: Internal Medicine

## 2016-04-10 NOTE — Telephone Encounter (Signed)
She has a rx written 3/22 with one refill so shouldn't need a new one

## 2016-04-12 ENCOUNTER — Other Ambulatory Visit: Payer: Self-pay | Admitting: Internal Medicine

## 2016-05-09 ENCOUNTER — Other Ambulatory Visit: Payer: Self-pay | Admitting: Internal Medicine

## 2016-05-31 ENCOUNTER — Other Ambulatory Visit: Payer: Self-pay | Admitting: Internal Medicine

## 2016-06-03 DIAGNOSIS — K602 Anal fissure, unspecified: Secondary | ICD-10-CM | POA: Insufficient documentation

## 2016-09-09 ENCOUNTER — Other Ambulatory Visit (HOSPITAL_COMMUNITY)
Admission: RE | Admit: 2016-09-09 | Discharge: 2016-09-09 | Disposition: A | Discharge status: Home / Self Care | Payer: BLUE CROSS/BLUE SHIELD | Source: Ambulatory Visit | Attending: Internal Medicine | Admitting: Internal Medicine

## 2016-09-09 ENCOUNTER — Other Ambulatory Visit: Payer: Self-pay | Admitting: Registered Nurse

## 2016-09-09 DIAGNOSIS — Z01419 Encounter for gynecological examination (general) (routine) without abnormal findings: Secondary | ICD-10-CM | POA: Diagnosis present

## 2016-09-11 LAB — CYTOLOGY - PAP

## 2017-02-21 ENCOUNTER — Ambulatory Visit: Payer: BLUE CROSS/BLUE SHIELD | Admitting: Women's Health

## 2017-03-04 ENCOUNTER — Ambulatory Visit: Payer: BLUE CROSS/BLUE SHIELD | Admitting: Women's Health

## 2017-06-12 ENCOUNTER — Encounter: Payer: BLUE CROSS/BLUE SHIELD | Admitting: Obstetrics and Gynecology

## 2017-10-15 ENCOUNTER — Encounter: Payer: BLUE CROSS/BLUE SHIELD | Admitting: Obstetrics and Gynecology

## 2017-10-29 ENCOUNTER — Other Ambulatory Visit: Payer: Self-pay

## 2017-10-29 ENCOUNTER — Ambulatory Visit: Payer: BLUE CROSS/BLUE SHIELD | Admitting: Obstetrics and Gynecology

## 2017-10-29 ENCOUNTER — Encounter: Payer: Self-pay | Admitting: Obstetrics and Gynecology

## 2017-10-29 VITALS — BP 108/70 | HR 100 | Resp 14 | Ht 69.0 in | Wt 146.0 lb

## 2017-10-29 DIAGNOSIS — R4586 Emotional lability: Secondary | ICD-10-CM | POA: Diagnosis not present

## 2017-10-29 DIAGNOSIS — N946 Dysmenorrhea, unspecified: Secondary | ICD-10-CM | POA: Diagnosis not present

## 2017-10-29 DIAGNOSIS — Z8742 Personal history of other diseases of the female genital tract: Secondary | ICD-10-CM

## 2017-10-29 DIAGNOSIS — N939 Abnormal uterine and vaginal bleeding, unspecified: Secondary | ICD-10-CM

## 2017-10-29 DIAGNOSIS — N3946 Mixed incontinence: Secondary | ICD-10-CM

## 2017-10-29 LAB — CBC
HEMATOCRIT: 46.2 % (ref 34.0–46.6)
Hemoglobin: 15.5 g/dL (ref 11.1–15.9)
MCH: 32.3 pg (ref 26.6–33.0)
MCHC: 33.5 g/dL (ref 31.5–35.7)
MCV: 96 fL (ref 79–97)
PLATELETS: 341 10*3/uL (ref 150–379)
RBC: 4.8 x10E6/uL (ref 3.77–5.28)
RDW: 13.1 % (ref 12.3–15.4)
WBC: 9 10*3/uL (ref 3.4–10.8)

## 2017-10-29 LAB — POCT URINALYSIS DIPSTICK
BILIRUBIN UA: NEGATIVE
GLUCOSE UA: NEGATIVE
Ketones, UA: NEGATIVE
Leukocytes, UA: NEGATIVE
Nitrite, UA: NEGATIVE
Protein, UA: NEGATIVE
RBC UA: NEGATIVE
Urobilinogen, UA: NEGATIVE E.U./dL — AB
pH, UA: 6.5 (ref 5.0–8.0)

## 2017-10-29 NOTE — Patient Instructions (Signed)
Kegel Exercises  Kegel exercises help strengthen the muscles that support the rectum, vagina, small intestine, bladder, and uterus. Doing Kegel exercises can help:   Improve bladder and bowel control.   Improve sexual response.   Reduce problems and discomfort during pregnancy.    Kegel exercises involve squeezing your pelvic floor muscles, which are the same muscles you squeeze when you try to stop the flow of urine. The exercises can be done while sitting, standing, or lying down, but it is best to vary your position.  Phase 1 exercises  1. Squeeze your pelvic floor muscles tight. You should feel a tight lift in your rectal area. If you are a female, you should also feel a tightness in your vaginal area. Keep your stomach, buttocks, and legs relaxed.  2. Hold the muscles tight for up to 10 seconds.  3. Relax your muscles.  Repeat this exercise 50 times a day or as many times as told by your health care provider. Continue to do this exercise for at least 4-6 weeks or for as long as told by your health care provider.  This information is not intended to replace advice given to you by your health care provider. Make sure you discuss any questions you have with your health care provider.  Document Released: 11/25/2012 Document Revised: 08/03/2016 Document Reviewed: 10/29/2015  Elsevier Interactive Patient Education  2018 Elsevier Inc[employer]  Urinary Incontinence  Urinary incontinence is the involuntary loss of urine from your bladder.  What are the causes?  There are many causes of urinary incontinence. They include:   Medicines.   Infections.   Prostatic enlargement, leading to overflow of urine from your bladder.   Surgery.   Neurological diseases.   Emotional factors.    What are the signs or symptoms?  Urinary Incontinence can be divided into four types:  1. Urge incontinence. Urge incontinence is the involuntary loss of urine before you have the opportunity to go to the bathroom. There is a sudden urge to  void but not enough time to reach a bathroom.  2. Stress incontinence. Stress incontinence is the sudden loss of urine with any activity that forces urine to pass. It is commonly caused by anatomical changes to the pelvis and sphincter areas of your body.  3. Overflow incontinence. Overflow incontinence is the loss of urine from an obstructed opening to your bladder. This results in a backup of urine and a resultant buildup of pressure within the bladder. When the pressure within the bladder exceeds the closing pressure of the sphincter, the urine overflows, which causes incontinence, similar to water overflowing a dam.  4. Total incontinence. Total incontinence is the loss of urine as a result of the inability to store urine within your bladder.    How is this diagnosed?  Evaluating the cause of incontinence may require:   A thorough and complete medical and obstetric history.   A complete physical exam.   Laboratory tests such as a urine culture and sensitivities.    When additional tests are indicated, they can include:   An ultrasound exam.   Kidney and bladder X-rays.   Cystoscopy. This is an exam of the bladder using a narrow scope.   Urodynamic testing to test the nerve function to the bladder and sphincter areas.    How is this treated?  Treatment for urinary incontinence depends on the cause:   For urge incontinence caused by a bacterial infection, antibiotics will be prescribed. If the urge incontinence is   to open the channel through the enlarged prostate will allow the flow of urine out of the bladder. In women with fibroids, a hysterectomy may be recommended.  For total incontinence, surgery  on your urinary sphincter may help. An artificial urinary sphincter (an inflatable cuff placed around the urethra) may be required. In women who have developed a hole-like passage between their bladder and vagina (vesicovaginal fistula), surgery to close the fistula often is required.  Follow these instructions at home:  Normal daily hygiene and the use of pads or adult diapers that are changed regularly will help prevent odors and skin damage.  Avoid caffeine. It can overstimulate your bladder.  Use the bathroom regularly. Try about every 2-3 hours to go to the bathroom, even if you do not feel the need to do so. Take time to empty your bladder completely. After urinating, wait a minute. Then try to urinate again.  For causes involving nerve dysfunction, keep a log of the medicines you take and a journal of the times you go to the bathroom. Contact a health care provider if:  You experience worsening of pain instead of improvement in pain after your procedure.  Your incontinence becomes worse instead of better. Get help right away if:  You experience fever or shaking chills.  You are unable to pass your urine.  You have redness spreading into your groin or down into your thighs. This information is not intended to replace advice given to you by your health care provider. Make sure you discuss any questions you have with your health care provider. Document Released: 01/16/2005 Document Revised: 07/19/2016 Document Reviewed: 05/18/2013 Elsevier Interactive Patient Education  2018 Elsevier Inc.  

## 2017-10-29 NOTE — Progress Notes (Signed)
46 y.o. [id number] Legally SeparatedCaucasianF here to discuss menstrual changes and mood swings.   She has a h/o endometriosis, has had several laparoscopies with treatment of endometriosis.  Cycles have always every 30-50 days, in the last 2 years they started to get regular, every 28-30 days and less cramping. In the last 6 months, the cramps have returned, but not as regular. Can be every 2-5 weeks, one or two episodes of bleeding every 2 weeks. She used to bleed for 5 days, now 7-10 days. 4-5 days are moderate flow and then it gets to spotting. She saturates a super + tampon in 3-4 hours. Cramps are moderate for 2 days. Advil and a heating pad helps.  Sexually active, rare ejaculation inside. Occasional pain with intercourse deep inside. Occasionally aching after intercourse. Same partner x 10 months (engaged), no STD concerns. She used to get ovarian cysts, she has had pain similar to that in the last 4-5 months. She was told that she had a fibroid in the past (only one time). She did have an endometrial polyp in the past.  Her mood isn't good. She has had an anxiety disorder since her 15's. Struggles with mild depression off and on.  In the last 6 months, she is more irritable than normal for her. Not sleeping well, wakes up and fall back to sleep. She c/o a brain fog and an increase in anxiety. She isn't having hot flashes, but does get warm. Just occasional night sweats.  Her primary manages her medications for anxiety and depression. Saw him last week and she just started Zoloft and is going off of Wellbutrin.  She has lost over 40 lbs in the last year. Her appetite is way down.  Period Duration (Days): 7-10 days  Period Pattern: (!) Irregular Menstrual Flow: Moderate Menstrual Control: Tampon, Maxi pad Menstrual Control Change Freq (Hours): changes pad/tampon every 3 hours  Dysmenorrhea: (!) Moderate Dysmenorrhea Symptoms: Cramping   She c/o GSI, worsened over time. Leaks with cough,  sneeze, leaks with sex. Occurs with orgasm, can be large amounts. Occasional leaks on the way to the bathroom.  She denies frequency, urgency or pain.   Patient's last menstrual period was 10/10/2017.          Sexually active: Yes.    The current method of family planning is none.    Exercising: No.  The patient does not participate in regular exercise at present. Smoker:  yes  Health Maintenance: Pap:  09-09-16 WNL  06-28-15 WNL NEG HR HPV History of abnormal Pap:  Yes- had colpo and cryosurgery in 2008 MMG:  01-25-16 -ductalgram- NEG Colonoscopy:  08/2016 Normal per patient BMD:   Never TDaP:  03-23-13 Gardasil: N/A   reports that she has been smoking cigarettes.  She has a 15.00 pack-year smoking history. she has never used smokeless tobacco. She reports that she drinks about 1.2 - 1.8 oz of alcohol per week. She reports that she does not use drugs.  Past Medical History:  Diagnosis Date  . Abnormal uterine bleeding   . Allergy   . Anxiety   . Arthritis   . Asthma   . Depression   . Fibroid   . GERD (gastroesophageal reflux disease)    tums prn  . Heart murmur   . PONV (postoperative nausea and vomiting)   . Ruptured disk   . Urinary incontinence     Past Surgical History:  Procedure Laterality Date  . ANOPLASTY    . APPENDECTOMY    .  COLPOSCOPY    . DIAGNOSTIC LAPAROSCOPY     x3 for endometriosis  . GYNECOLOGIC CRYOSURGERY    . KNEE ARTHROSCOPY     right-x3  . left hand surgery  03/11/12  . MOUTH SURGERY    . PELVIC LAPAROSCOPY    . SHOULDER ARTHROSCOPY  right    Current Outpatient Medications  Medication Sig Dispense Refill  . baclofen (LIORESAL) 10 MG tablet TAKE 1 TABLET (10 MG TOTAL) BY MOUTH 3 (THREE) TIMES DAILY. 90 tablet 0  . buPROPion (WELLBUTRIN XL) 150 MG 24 hr tablet Take 150 mg daily by mouth.    . clonazePAM (KLONOPIN) 1 MG tablet Take 1 mg at bedtime by mouth.    . gabapentin (NEURONTIN) 300 MG capsule TAKE 1 CAPSULE (300 MG TOTAL) BY MOUTH 3  (THREE) TIMES DAILY. 270 capsule 1  . ibuprofen (ADVIL,MOTRIN) 200 MG tablet Take 200 mg by mouth every 6 (six) hours as needed.    . sertraline (ZOLOFT) 100 MG tablet Take 100 mg by mouth.    . traMADol (ULTRAM) 50 MG tablet Take 1-2 tablets (50-100 mg total) by mouth every 6 (six) hours as needed. 120 tablet 1  . traZODone (DESYREL) 150 MG tablet TAKE 2 TABLETS BY MOUTH AT BEDTIME 60 tablet 0   No current facility-administered medications for this visit.     Family History  Problem Relation Age of Onset  . Heart disease Mother   . Diabetes Mother   . Atrial fibrillation Mother   . Diabetes Father   . Hyperlipidemia Father   . Asthma Sister   . Heart disease Daughter   . Heart disease Brother   . Stroke Maternal Grandmother   . Dementia Paternal Grandmother     Review of Systems  Constitutional: Negative.   HENT: Negative.   Eyes: Negative.   Respiratory: Negative.   Cardiovascular: Negative.   Gastrointestinal: Negative.   Endocrine: Negative.   Genitourinary: Positive for dyspareunia and menstrual problem.       Urine leakage  Dysmenorrhea   Musculoskeletal: Negative.   Skin: Negative.   Allergic/Immunologic: Negative.   Neurological: Negative.   Psychiatric/Behavioral: Negative.     Exam:   BP 108/70 (BP Location: Right Arm, Patient Position: Sitting, Cuff Size: Normal)   Pulse 100   Resp 14   Ht 5\' 9"  (1.753 m)   Wt 146 lb (66.2 kg)   LMP 10/10/2017   BMI 21.56 kg/m   Weight change: @WEIGHTCHANGE @ Height:   Height: 5\' 9"  (175.3 cm)  Ht Readings from Last 3 Encounters:  10/29/17 5\' 9"  (1.753 m)  02/14/16 5\' 9"  (1.753 m)  11/06/15 5\' 9"  (1.753 m)    General appearance: alert, cooperative and appears stated age Head: Normocephalic, without obvious abnormality, atraumatic Neck: no adenopathy, supple, symmetrical, trachea midline and thyroid normal to inspection and palpation Lungs: clear to auscultation bilaterally Cardiovascular: regular rate and  rhythm Abdomen: soft, non-tender; non distended,  no masses,  no organomegaly Extremities: extremities normal, atraumatic, no cyanosis or edema Skin: Skin color, texture, turgor normal. No rashes or lesions Lymph nodes: Cervical, supraclavicular, and axillary nodes normal. No abnormal inguinal nodes palpated Neurologic: Grossly normal   Pelvic: External genitalia:  no lesions              Urethra:  normal appearing urethra with no masses, tenderness or lesions              Bartholins and Skenes: normal  Vagina: normal appearing vagina with normal color and discharge, no lesions              Cervix: no lesions  Kegel strength 3/5               Bimanual Exam:  Uterus:  normal size, contour, position, consistency, mobility, non-tender and anteverted              Adnexa: no mass, fullness, tenderness               Rectovaginal: Confirms               Anus:  normal sphincter tone, no lesions  Chaperone was present for exam.  A:  Abnormal uterine bleeding  Dysmenorrhea  Mood changes, just in the process of changing her medication  Smoker  Mixed urinary incontinence  P:   CBC, TSH  UPT  Return for ultrasound, possible sonohysterogram, possible endometrial biopsy  Discussed quitting smoking, for overall health  Not currently a candidate for OCP's  Will refer to PT for incontinence  Discussed the option of trying ditropan around to sex to see if it helps.

## 2017-10-30 ENCOUNTER — Telehealth: Payer: Self-pay | Admitting: Obstetrics and Gynecology

## 2017-10-30 LAB — URINE CULTURE

## 2017-10-30 LAB — TSH: TSH: 0.702 u[IU]/mL (ref 0.450–4.500)

## 2017-10-30 LAB — URINALYSIS, MICROSCOPIC ONLY: Casts: NONE SEEN /lpf

## 2017-10-30 NOTE — Telephone Encounter (Signed)
Spoke with patient. All questions answered regarding SHGM and EMB. Patient verbalizes understanding.  Routing to provider for final review. Patient agreeable to disposition. Will close encounter.

## 2017-10-30 NOTE — Telephone Encounter (Signed)
Spoke with patient in regards to benefits (see account note) and scheduled an ultrasound with possible sonohysterogram and endormetrial biopsy.  Patient would like to speak with a nurse in regards to what to expect if a sonsohysterogram and endometrial biopsy is performed. Advised I will have a nurse to call her back. Patient is agreeable. Patient request to be called back later in the afternoon, when she has finished her work day.   Routing to Triage Nurse

## 2017-11-18 ENCOUNTER — Other Ambulatory Visit: Payer: Self-pay

## 2017-11-18 ENCOUNTER — Ambulatory Visit (INDEPENDENT_AMBULATORY_CARE_PROVIDER_SITE_OTHER): Payer: BLUE CROSS/BLUE SHIELD

## 2017-11-18 ENCOUNTER — Other Ambulatory Visit: Payer: Self-pay | Admitting: Obstetrics and Gynecology

## 2017-11-18 ENCOUNTER — Ambulatory Visit (INDEPENDENT_AMBULATORY_CARE_PROVIDER_SITE_OTHER): Payer: BLUE CROSS/BLUE SHIELD | Admitting: Obstetrics and Gynecology

## 2017-11-18 ENCOUNTER — Encounter: Payer: Self-pay | Admitting: Obstetrics and Gynecology

## 2017-11-18 DIAGNOSIS — N939 Abnormal uterine and vaginal bleeding, unspecified: Secondary | ICD-10-CM | POA: Diagnosis not present

## 2017-11-18 NOTE — Progress Notes (Signed)
GYNECOLOGY  VISIT   HPI: 46 y.o.   Legally Separated  Caucasian  female   [phone] with Patient's last menstrual period was 11/06/2017.   here for f/u abnormal uterine bleeding     GYNECOLOGIC HISTORY: Patient's last menstrual period was 11/06/2017. Contraception:none Menopausal hormone therapy: none         OB History    Gravida Para Term Preterm AB Living   3 2 2   1 2    SAB TAB Ectopic Multiple Live Births   1       2         Patient Active Problem List   Diagnosis Date Noted  . 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 Hip pain 02/14/2016  . Endometriosis 09/27/2013  . TMJ (temporomandibular joint syndrome) 02/10/2013  . ANEMIA-NOS 05/26/2009  . ALLERGIC RHINITIS 05/26/2009  . PALPITATIONS 05/26/2009  . CARDIAC MURMUR 05/26/2009  . DYSPNEA 05/26/2009    Past Medical History:  Diagnosis Date  . Abnormal uterine bleeding   . Allergy   . Anxiety   . Arthritis   . Asthma   . Depression   . Fibroid   . GERD (gastroesophageal reflux disease)    tums prn  . Heart murmur   . PONV (postoperative nausea and vomiting)   . Ruptured disk   . Urinary incontinence     Past Surgical History:  Procedure Laterality Date  . ANOPLASTY    . APPENDECTOMY    . COLPOSCOPY    . DIAGNOSTIC LAPAROSCOPY     x3 for endometriosis  . GYNECOLOGIC CRYOSURGERY    . KNEE ARTHROSCOPY     right-x3  . LAPAROSCOPY  03/26/2012   Procedure: LAPAROSCOPY OPERATIVE;  Surgeon: Lovenia Kim, MD;  Location: [facility];  Service: Gynecology;  Laterality: N/A;  with ablation of endometriosis   . left hand surgery  03/11/12  . MOUTH SURGERY    . PELVIC LAPAROSCOPY    . SHOULDER ARTHROSCOPY  right  . TENDON EXPLORATION  03/11/2012   Procedure: TENDON EXPLORATION;  Surgeon: Wynonia Sours, MD;  Location: [facility];  Service: Orthopedics;  Laterality: Left;  EXPLORATION OF FLEXOR TENDON LEFT INDEX FINGER       Current Outpatient Medications   Medication Sig Dispense Refill  . baclofen (LIORESAL) 10 MG tablet TAKE 1 TABLET (10 MG TOTAL) BY MOUTH 3 (THREE) TIMES DAILY. 90 tablet 0  . clonazePAM (KLONOPIN) 1 MG tablet Take 1 mg at bedtime by mouth.    . gabapentin (NEURONTIN) 300 MG capsule TAKE 1 CAPSULE (300 MG TOTAL) BY MOUTH 3 (THREE) TIMES DAILY. 270 capsule 1  . ibuprofen (ADVIL,MOTRIN) 200 MG tablet Take 200 mg by mouth every 6 (six) hours as needed.    . sertraline (ZOLOFT) 100 MG tablet Take 100 mg by mouth.    . traMADol (ULTRAM) 50 MG tablet Take 1-2 tablets (50-100 mg total) by mouth every 6 (six) hours as needed. 120 tablet 1  . traZODone (DESYREL) 150 MG tablet TAKE 2 TABLETS BY MOUTH AT BEDTIME 60 tablet 0   No current facility-administered medications for this visit.      ALLERGIES: Erythromycin and Vancomycin cross reactors  Family History  Problem Relation Age of Onset  . Heart disease Mother   . Diabetes Mother   . Atrial fibrillation Mother   . Diabetes Father   . Hyperlipidemia Father   . Asthma Sister   . Heart disease Daughter   .  Heart disease Brother   . Stroke Maternal Grandmother   . Dementia Paternal Grandmother     Social History   Socioeconomic History  . Marital status: Legally Separated    Spouse name: Not on file  . Number of children: Not on file  . Years of education: Not on file  . Highest education level: Not on file  Social Needs  . Financial resource strain: Not on file  . Food insecurity - worry: Not on file  . Food insecurity - inability: Not on file  . Transportation needs - medical: Not on file  . Transportation needs - non-medical: Not on file  Occupational History  . Not on file  Tobacco Use  . Smoking status: Current Every Day Smoker    Packs/day: 1.00    Years: 15.00    Pack years: 15.00    Types: Cigarettes  . Smokeless tobacco: Never Used  Substance and Sexual Activity  . Alcohol use: Yes    Alcohol/week: 1.2 - 1.8 oz    Types: 2 - 3 Glasses of wine per  week  . Drug use: No  . Sexual activity: Yes    Partners: Male    Birth control/protection: None  Other Topics Concern  . Not on file  Social History Narrative  . Not on file    Review of Systems  Constitutional: Negative.   HENT: Negative.   Eyes: Negative.   Respiratory: Negative.   Cardiovascular: Negative.   Gastrointestinal: Negative.   Genitourinary: Negative.   Musculoskeletal: Negative.   Skin: Negative.   Neurological: Negative.   Endo/Heme/Allergies: Negative.   Psychiatric/Behavioral: Negative.     PHYSICAL EXAMINATION:    BP (!) 142/98 (BP Location: Right Arm, Patient Position: Sitting, Cuff Size: Normal)   Pulse 76   Resp 18   Wt 148 lb (67.1 kg)   LMP 11/06/2017   BMI 21.86 kg/m     General appearance: alert, cooperative and appears stated age  Pelvic: External genitalia:  no lesions              Urethra:  normal appearing urethra with no masses, tenderness or lesions              Bartholins and Skenes: normal                 Vagina: normal appearing vagina with normal color and discharge, no lesions              Cervix:no lesions  Sonohysterogram The procedure and risks of the procedure were reviewed with the patient, consent form was signed. A speculum was placed in the vagina and the cervix was cleansed with betadine. A tenaculum was placed. The sonohysterogram catheter was inserted into the uterine cavity without difficulty. The tenaculum and speculum were removed. Saline was infused under direct observation with the ultrasound. No intracavitary defects were noted.The catheter was removed.   The risks of endometrial biopsy were reviewed and a consent was obtained.  A speculum was placed in the vagina and the cervix was cleansed with betadine. A mini-pipelle was placed into the endometrial cavity. The uterus sounded to ~9 cm. The endometrial biopsy was performed, moderate tissue was obtained. The tenaculum and speculum were removed. There were no  complications.    Chaperone was present for exam.  Ultrasound images were reviewed with the patient  ASSESSMENT Abnormal uterine bleeding BP elevated today, patient states always normal.   PLAN Endometrial biopsy done Discussed option of the mirena IUD,  information given She is interested in OCP's, she is a smoker, we discussed her being off cigarettes for at least 3 months prior to starting OCP's, would also need normal BP Recheck BP 138/98 (both BP's were don't post procedure) Will await biopsy results prior to making further plans   An After Visit Summary was printed and given to the patient.  15 minutes face to face time of which over 50% was spent in counseling.

## 2017-11-18 NOTE — Patient Instructions (Signed)

## 2017-11-20 NOTE — Addendum Note (Signed)
Addended by: Dorothy Spark on: 11/20/2017 04:17 PM   Modules accepted: Orders

## 2017-11-28 ENCOUNTER — Other Ambulatory Visit: Payer: Self-pay | Admitting: Obstetrics and Gynecology

## 2017-11-28 DIAGNOSIS — Z308 Encounter for other contraceptive management: Secondary | ICD-10-CM

## 2017-11-28 DIAGNOSIS — N939 Abnormal uterine and vaginal bleeding, unspecified: Secondary | ICD-10-CM

## 2017-11-28 NOTE — Progress Notes (Signed)
Mirena IUD ordered

## 2017-12-03 ENCOUNTER — Encounter: Payer: BLUE CROSS/BLUE SHIELD | Admitting: Obstetrics and Gynecology

## 2017-12-29 ENCOUNTER — Telehealth: Payer: Self-pay | Admitting: Obstetrics and Gynecology

## 2017-12-29 NOTE — Telephone Encounter (Signed)
Spoke with patient in regards to medical benefits for Mirena IUD insertion. Patient understood information presented, but states she is unable to schedule at this time. Patient would like to know what other options are available  Routing to Dr Talbert Nan

## 2017-12-31 MED ORDER — NORETHINDRONE 0.35 MG PO TABS: 1.0000 | ORAL_TABLET | Freq: Every day | ORAL | 2 refills | Status: DC

## 2017-12-31 NOTE — Telephone Encounter (Signed)
She needs to be seen for vaginitis check, or she can treat with over the counter medications.

## 2017-12-31 NOTE — Telephone Encounter (Signed)
She isn't a candidate for regular ocp's, but she could try the mini-pill (progesterone only). It won't be as effective as the IUD, but it may help. If she wants to try this, please call in 3 packs of micronor and have her f/u in 3 months.

## 2017-12-31 NOTE — Telephone Encounter (Signed)
Spoke with patient.Advised of message as seen below from [place]. Patient would like to try POP. Advised will need three month recheck. Patient declines to schedule at this time. States she would like to start the pills and call with an update and schedule recheck at that time if working well. Rx for Micronor #1 2RF sent to pharmacy on file. Patient also states that she has vaginal itching with white discharge. States she has a history of yeast infections and forgot to mention this to [place]. Asking for Diflucan and Terazol cream. "I have to have both. I can't just use one and OTC medication never works for me. I have this often." Advised will have to review with Dr.Jertson as we do not typically prescribe medication over the phone.

## 2018-01-14 NOTE — Telephone Encounter (Signed)
Patient would like to speak to [facility] about the medication she was prescribed.

## 2018-01-14 NOTE — Telephone Encounter (Signed)
Spoke with patient. Patient is no longer having problems with yeast symptoms. States that since starting Micronor she has been having increased trouble sleeping. Reports waking up in the middle of the night and being unable to return to sleep. States this occurred before taking the medication, but has worsened. Is taking her POP at night. Advised to transition to taking this in the morning to see if this will help. Patient will start taking her pill in the morning and will call with any concerns or ongoing symptoms.  Routing to provider for final review. Patient agreeable to disposition. Will close encounter.

## 2018-02-24 ENCOUNTER — Telehealth: Payer: Self-pay | Admitting: Obstetrics and Gynecology

## 2018-02-24 NOTE — Telephone Encounter (Signed)
Patient called requesting to speak with the nurse. She has questions about her birth control and some side effects she is experiencing that may be related to it.

## 2018-02-24 NOTE — Telephone Encounter (Signed)
Spoke with patient. In 2nd month of POP, reports menses is irregular with clotting during first 2-3 day of cycle. Reports clots prior to POP. Asking if irregular bleeding is normal? No missed pills, takes same time daily. Advised can take 3 months for cycles to adjust to POP, may or may not have menses, continue to monitor, return call to office with any heavy bleeding.   Reports increased irritability, agitation and night sweats since starting POP. Has discussed with POP, unsure if med or hormone related, advised to f/u with GYN.   OV scheduled for 03/06/18 at 8:15am with Dr. Talbert Nan.   Routing to provider for final review. Patient is agreeable to disposition. Will close encounter.

## 2018-03-04 ENCOUNTER — Other Ambulatory Visit: Payer: Self-pay

## 2018-03-04 ENCOUNTER — Encounter: Payer: Self-pay | Admitting: Obstetrics and Gynecology

## 2018-03-04 ENCOUNTER — Ambulatory Visit (INDEPENDENT_AMBULATORY_CARE_PROVIDER_SITE_OTHER): Payer: BLUE CROSS/BLUE SHIELD | Admitting: Obstetrics and Gynecology

## 2018-03-04 VITALS — BP 136/70 | HR 76 | Resp 12 | Wt 162.0 lb

## 2018-03-04 DIAGNOSIS — R61 Generalized hyperhidrosis: Secondary | ICD-10-CM | POA: Diagnosis not present

## 2018-03-04 DIAGNOSIS — R4586 Emotional lability: Secondary | ICD-10-CM

## 2018-03-04 DIAGNOSIS — L709 Acne, unspecified: Secondary | ICD-10-CM

## 2018-03-04 NOTE — Progress Notes (Signed)
GYNECOLOGY  VISIT   HPI: 47 y.o.   Legally Separated  Caucasian  female   [phone] with Patient's last menstrual period was 02/23/2018.   here for   Vasomotor symptoms  The patient was seen in November with AUB, she had a negative sonohysterogram, a negative endometrial biopsy in 11/18 and was started on micronor in 12/18. She just started her 3rd pack of micronor. She has had 2 cycles since starting it. She bleed for 8 days with the first cycle, moderate for a couple of days. The cramping is less intense, the heavier part of the cycle didn't last as long. The second cycle lasted for 8 days, the last several days was spotting, overall it was lighter.  She is c/o night sweats. She keeps the room at 65 degrees, just wearing a thin night gown and a sheet. Waking up 1 x a night soaking wet and freezing.  The night sweats have been going on for a couple of months. She is snapping at her kids. Increase in mood changes. Can go from a good mood to bad mood quickly. Feels foggy brained, some memory issues.  She hasn't slept well for years. Primary just put her on Remeron and is sleeping better. She also takes on clonazepam at night (doesn't feel any effect from it, has been on it for so long). She is also on Zoloft and Wellbutrin.  She is on Gabapentin as needed for pain for a ruptured disc.  She has been intolerant of the heat for a few years.  Normal TSH and CBC in 11/18. She does c/o acne.   GYNECOLOGIC HISTORY: Patient's last menstrual period was 02/23/2018. Contraception: POP Menopausal hormone therapy: none        OB History    Gravida Para Term Preterm AB Living   3 2 2   1 2    SAB TAB Ectopic Multiple Live Births   1       2         Patient Active Problem List   Diagnosis Date Noted  . 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 Hip pain 02/14/2016  . Endometriosis 09/27/2013  . TMJ (temporomandibular joint syndrome) 02/10/2013  . ANEMIA-NOS  05/26/2009  . ALLERGIC RHINITIS 05/26/2009  . PALPITATIONS 05/26/2009  . CARDIAC MURMUR 05/26/2009  . DYSPNEA 05/26/2009    Past Medical History:  Diagnosis Date  . Abnormal uterine bleeding   . Allergy   . Anxiety   . Arthritis   . Asthma   . Depression   . Fibroid   . GERD (gastroesophageal reflux disease)    tums prn  . Heart murmur   . PONV (postoperative nausea and vomiting)   . Ruptured disk   . Urinary incontinence     Past Surgical History:  Procedure Laterality Date  . ANOPLASTY    . APPENDECTOMY    . COLPOSCOPY    . DIAGNOSTIC LAPAROSCOPY     x3 for endometriosis  . GYNECOLOGIC CRYOSURGERY    . KNEE ARTHROSCOPY     right-x3  . LAPAROSCOPY  03/26/2012   Procedure: LAPAROSCOPY OPERATIVE;  Surgeon: Lovenia Kim, MD;  Location: [facility];  Service: Gynecology;  Laterality: N/A;  with ablation of endometriosis   . left hand surgery  03/11/12  . MOUTH SURGERY    . PELVIC LAPAROSCOPY    . SHOULDER ARTHROSCOPY  right  . TENDON EXPLORATION  03/11/2012   Procedure: TENDON EXPLORATION;  Surgeon: Dominica Severin  Beola Cord, MD;  Location: [facility];  Service: Orthopedics;  Laterality: Left;  EXPLORATION OF FLEXOR TENDON LEFT INDEX FINGER       Current Outpatient Medications  Medication Sig Dispense Refill  . baclofen (LIORESAL) 10 MG tablet TAKE 1 TABLET (10 MG TOTAL) BY MOUTH 3 (THREE) TIMES DAILY. 90 tablet 0  . buPROPion (WELLBUTRIN XL) 150 MG 24 hr tablet TAKE 1 TABLET BY MOUTH EVERY DAY IN THE MORNING  1  . clonazePAM (KLONOPIN) 1 MG tablet Take 1 mg at bedtime by mouth.    . gabapentin (NEURONTIN) 300 MG capsule TAKE 1 CAPSULE (300 MG TOTAL) BY MOUTH 3 (THREE) TIMES DAILY. 270 capsule 1  . ibuprofen (ADVIL,MOTRIN) 200 MG tablet Take 200 mg by mouth every 6 (six) hours as needed.    . mirtazapine (REMERON) 15 MG tablet     . norethindrone (MICRONOR,CAMILA,ERRIN) 0.35 MG tablet Take 1 tablet (0.35 mg total) by mouth daily. 1 Package 2  . sertraline (ZOLOFT) 100  MG tablet Take 100 mg by mouth.    . traMADol (ULTRAM) 50 MG tablet Take 1-2 tablets (50-100 mg total) by mouth every 6 (six) hours as needed. 120 tablet 1   No current facility-administered medications for this visit.      ALLERGIES: Erythromycin and Vancomycin cross reactors  Family History  Problem Relation Age of Onset  . Heart disease Mother   . Diabetes Mother   . Atrial fibrillation Mother   . Diabetes Father   . Hyperlipidemia Father   . Asthma Sister   . Heart disease Daughter   . Heart disease Brother   . Stroke Maternal Grandmother   . Dementia Paternal Grandmother     Social History   Socioeconomic History  . Marital status: Legally Separated    Spouse name: Not on file  . Number of children: Not on file  . Years of education: Not on file  . Highest education level: Not on file  Social Needs  . Financial resource strain: Not on file  . Food insecurity - worry: Not on file  . Food insecurity - inability: Not on file  . Transportation needs - medical: Not on file  . Transportation needs - non-medical: Not on file  Occupational History  . Not on file  Tobacco Use  . Smoking status: Current Every Day Smoker    Packs/day: 1.00    Years: 15.00    Pack years: 15.00    Types: Cigarettes  . Smokeless tobacco: Never Used  Substance and Sexual Activity  . Alcohol use: Yes    Alcohol/week: 1.2 - 1.8 oz    Types: 2 - 3 Glasses of wine per week  . Drug use: No  . Sexual activity: Yes    Partners: Male    Birth control/protection: None  Other Topics Concern  . Not on file  Social History Narrative  . Not on file    Review of Systems  Constitutional: Negative.   HENT: Negative.   Eyes: Negative.   Cardiovascular: Negative.   Gastrointestinal: Negative.   Genitourinary:       Loss of urine with cough and sneeze  Musculoskeletal: Negative.   Skin: Positive for itching and rash.  Neurological:       Difficulty with memory   Endo/Heme/Allergies:        Night sweats  Hot flashes  Psychiatric/Behavioral: Negative.     PHYSICAL EXAMINATION:    BP 136/70 (BP Location: Right Arm, Patient Position: Sitting,  Cuff Size: Normal)   Pulse 76   Resp 12   Wt 162 lb (73.5 kg)   LMP 02/23/2018   BMI 23.92 kg/m     General appearance: alert, cooperative and appears stated age  ASSESSMENT Mood changes, on antidepressants  Acne, discussed that this could be from the micronor Night sweats in the last few months Smoker, down to 2 packs a week, trying to quit    PLAN Already on an SSRI She will try gabapentin for night sweats (she has it for pain and only uses it prn) Can also try estroven pm We discussed that the micronor could cause mood changes and acne Discussed stopping the micronor and using a mirena IUD If she has the mirena could try estrogen patch if needed She is continuing to try to quit smoking, smoking is not a contraindication to HRT, but can increase the risks She was asking about hysterectomy. If we can't control her bleeding or she can't tolerate the medication to control her bleeding than hysterectomy would be reasonable.  I reviewed her last op note, mild endometriosis was treated in her [place], no other endometriosis was noted.     An After Visit Summary was printed and given to the patient.  ~25 minutes face to face time of which over 50% was spent in counseling.

## 2018-03-04 NOTE — Patient Instructions (Signed)
You can try estroven pm  You can try gabapentin at bedtime

## 2018-03-05 ENCOUNTER — Telehealth: Payer: Self-pay

## 2018-03-05 NOTE — Telephone Encounter (Signed)
Received [facility] for 90-day supply Norethindrom 0.35mg .  Medication refill request: OCP Last AEX:  Last ov 03-04-18 Next AEX: none scheduled Last MMG (if hormonal medication request): 01/2016 Neg MMG but rec.U/S;pt. Had Neg ductogram Refill authorized: please advise

## 2018-03-05 NOTE — Telephone Encounter (Signed)
We had talked about her possibly stopping the micronor. See what she wants to do. If she wants to continue the micronor, please call in a 3 month supply and have her set up an annual exam in 3 months.

## 2018-03-06 ENCOUNTER — Ambulatory Visit: Payer: Self-pay | Admitting: Obstetrics and Gynecology

## 2018-03-06 MED ORDER — NORETHINDRONE 0.35 MG PO TABS: 1.0000 | ORAL_TABLET | Freq: Every day | ORAL | 0 refills | Status: DC

## 2018-03-06 NOTE — Telephone Encounter (Signed)
Spoke with patient. Patient states that she is still taking Micronor. Refill for 3 months sent to pharmacy on file. States she has been doing well with Gabapentin and is not having night sweats. States that she has been having increased irritability and mood swings that her partner has noticed. Reports she spoke with Dr.Jertson about using a "transdermal patch." Asking if she can start on this. Advised will review with Dr.Jertson and return call.

## 2018-03-06 NOTE — Telephone Encounter (Signed)
I would not recommend starting the estrogen patch for her mood swings. The estrogen was if we couldn't getter her vasomotor symptoms under control.  I also wouldn't use the patch while she is on micronor. The micronor could cause mood changes, she can try stopping the micronor and see how she does.  We also need to give things time to work.  Other than the gabapentin and possibly stopping micronor, I wouldn't make any other changes for a month.

## 2018-03-09 NOTE — Telephone Encounter (Signed)
Called patient and left message for her to return call to me or Kaitlyn.

## 2018-03-10 NOTE — Telephone Encounter (Signed)
Patient called to return call from [place].

## 2018-03-11 NOTE — Telephone Encounter (Signed)
Spoke with patient and notified her of Dr.Jertson's recommendations below. She is doing well with Gabapentin and wants to continue with Micronor for now. She will call back with an update in 3-4 weeks after she has given things time to work.

## 2018-03-25 ENCOUNTER — Other Ambulatory Visit: Payer: Self-pay | Admitting: Obstetrics and Gynecology

## 2018-03-25 DIAGNOSIS — Z1231 Encounter for screening mammogram for malignant neoplasm of breast: Secondary | ICD-10-CM

## 2018-04-13 ENCOUNTER — Ambulatory Visit (INDEPENDENT_AMBULATORY_CARE_PROVIDER_SITE_OTHER): Payer: BLUE CROSS/BLUE SHIELD | Admitting: Obstetrics and Gynecology

## 2018-04-13 ENCOUNTER — Other Ambulatory Visit (HOSPITAL_COMMUNITY)
Admission: RE | Admit: 2018-04-13 | Discharge: 2018-04-13 | Disposition: A | Discharge status: Home / Self Care | Payer: BLUE CROSS/BLUE SHIELD | Source: Ambulatory Visit | Attending: Obstetrics and Gynecology | Admitting: Obstetrics and Gynecology

## 2018-04-13 ENCOUNTER — Other Ambulatory Visit: Payer: Self-pay

## 2018-04-13 ENCOUNTER — Encounter: Payer: Self-pay | Admitting: Obstetrics and Gynecology

## 2018-04-13 VITALS — BP 118/70 | HR 88 | Resp 16 | Ht 69.0 in | Wt 168.0 lb

## 2018-04-13 DIAGNOSIS — Z3041 Encounter for surveillance of contraceptive pills: Secondary | ICD-10-CM | POA: Diagnosis not present

## 2018-04-13 DIAGNOSIS — N393 Stress incontinence (female) (male): Secondary | ICD-10-CM | POA: Diagnosis not present

## 2018-04-13 DIAGNOSIS — N6452 Nipple discharge: Secondary | ICD-10-CM

## 2018-04-13 DIAGNOSIS — Z01419 Encounter for gynecological examination (general) (routine) without abnormal findings: Secondary | ICD-10-CM | POA: Diagnosis not present

## 2018-04-13 DIAGNOSIS — Z113 Encounter for screening for infections with a predominantly sexual mode of transmission: Secondary | ICD-10-CM | POA: Insufficient documentation

## 2018-04-13 DIAGNOSIS — Z124 Encounter for screening for malignant neoplasm of cervix: Secondary | ICD-10-CM | POA: Diagnosis not present

## 2018-04-13 DIAGNOSIS — E559 Vitamin D deficiency, unspecified: Secondary | ICD-10-CM | POA: Diagnosis not present

## 2018-04-13 DIAGNOSIS — Z1151 Encounter for screening for human papillomavirus (HPV): Secondary | ICD-10-CM | POA: Diagnosis not present

## 2018-04-13 MED ORDER — NORETHINDRONE 0.35 MG PO TABS: 1.0000 | ORAL_TABLET | Freq: Every day | ORAL | 3 refills | Status: DC

## 2018-04-13 MED ORDER — GABAPENTIN 600 MG PO TABS
600.0000 mg | ORAL_TABLET | Freq: Three times a day (TID) | ORAL | 3 refills | Status: DC
Start: 2018-04-13 — End: 2018-05-06

## 2018-04-13 NOTE — Progress Notes (Signed)
47 y.o. [id number] Legally SeparatedCaucasianF here for annual exam.  The patient had a negative evaluation for AUB at the end of 12/18. She was started on Micronor, her cycles are now tolerable with the micronor.   She has a h/o endometriosis.  Period Cycle (Days): 4 Period Duration (Days): 28 Period Pattern: Regular Menstrual Flow: Light Menstrual Control: Maxi pad, Tampon Menstrual Control Change Freq (Hours): 6 Dysmenorrhea: None  She intermittent aching in her pelvis for the last 6 weeks. Random, can come daily or not for days. Can last for 15 minutes. Feels like she has felt in the past after a cyst rupture. Only a 2/10 in severity.  Sexually active, same partner for over a year. Not using condoms. She has issues with night sweats, she has gabapentin for nerve pain, but it is helping her night sweats.  She c/o hair growth on her chin, new. Libido is down in the last 2 months. Able to enjoy intercourse when she is active.   She has had yellow nipple d/c from the right breast since her last child was born 6 years ago. She had a ductogram in 4/17. Has mammogram scheduled for later this week.  No longer having spontaneous d/c, only if she expresses.   Patient's last menstrual period was 03/23/2018 (approximate).          Sexually active: Yes.    The current method of family planning is OCP (estrogen/progesterone).    Exercising: No.  The patient does not participate in regular exercise at present. Smoker:  Yes, she is smoking a 1/4 pack a day (down from a PPD)  Health Maintenance: Pap:  09/09/16 Neg   06/28/15 Neg. 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 Has appt 04/17/18 Ductogram secondary to right nipple d/c (yellow) Colonoscopy:  2017 normal (was told to f/u in 5 years) BMD:   never TDaP:  2014 Gardasil: n/a   reports that she has been smoking cigarettes.  She has a 3.75 pack-year smoking history. She has never used smokeless  tobacco. She reports that she drinks about 1.2 - 1.8 oz of alcohol per week. She reports that she does not use drugs. She is a caregiver.   Past Medical History:  Diagnosis Date  . Abnormal uterine bleeding   . Allergy   . Anxiety   . Arthritis   . Asthma   . Depression   . Fibroid   . GERD (gastroesophageal reflux disease)    tums prn  . Heart murmur   . PONV (postoperative nausea and vomiting)   . Ruptured disk   . Urinary incontinence     Past Surgical History:  Procedure Laterality Date  . ANOPLASTY    . APPENDECTOMY    . COLPOSCOPY    . DIAGNOSTIC LAPAROSCOPY     x3 for endometriosis  . GYNECOLOGIC CRYOSURGERY    . KNEE ARTHROSCOPY     right-x3  . LAPAROSCOPY  03/26/2012   Procedure: LAPAROSCOPY OPERATIVE;  Surgeon: Lovenia Kim, MD;  Location: [facility];  Service: Gynecology;  Laterality: N/A;  with ablation of endometriosis   . left hand surgery  03/11/12  . MOUTH SURGERY    . PELVIC LAPAROSCOPY    . SHOULDER ARTHROSCOPY  right  . TENDON EXPLORATION  03/11/2012   Procedure: TENDON EXPLORATION;  Surgeon: Wynonia Sours, MD;  Location: [facility];  Service: Orthopedics;  Laterality: Left;  EXPLORATION OF FLEXOR TENDON LEFT  INDEX FINGER       Current Outpatient Medications  Medication Sig Dispense Refill  . baclofen (LIORESAL) 10 MG tablet TAKE 1 TABLET (10 MG TOTAL) BY MOUTH 3 (THREE) TIMES DAILY. 90 tablet 0  . buPROPion (WELLBUTRIN XL) 150 MG 24 hr tablet TAKE 1 TABLET BY MOUTH EVERY DAY IN THE MORNING  1  . clonazePAM (KLONOPIN) 1 MG tablet Take 1 mg at bedtime by mouth.    . Eluxadoline (VIBERZI) 100 MG TABS Take 1 tablet by mouth daily.    Marland Kitchen gabapentin (NEURONTIN) 300 MG capsule TAKE 1 CAPSULE (300 MG TOTAL) BY MOUTH 3 (THREE) TIMES DAILY. (Patient taking differently: Take 600 mg by mouth at bedtime. TAKE 1 CAPSULE (300 MG TOTAL) BY MOUTH 3 (THREE) TIMES DAILY.) 270 capsule 1  . ibuprofen (ADVIL,MOTRIN) 200 MG tablet Take 200 mg by mouth every 6  (six) hours as needed.    . mirtazapine (REMERON) 15 MG tablet Take 15 mg by mouth at bedtime.     . norethindrone (MICRONOR,CAMILA,ERRIN) 0.35 MG tablet Take 1 tablet (0.35 mg total) by mouth daily. 3 Package 0  . sertraline (ZOLOFT) 100 MG tablet Take 100 mg by mouth.    . traMADol (ULTRAM) 50 MG tablet Take 1-2 tablets (50-100 mg total) by mouth every 6 (six) hours as needed. 120 tablet 1   No current facility-administered medications for this visit.     Family History  Problem Relation Age of Onset  . Heart disease Mother   . Diabetes Mother   . Atrial fibrillation Mother   . Diabetes Father   . Hyperlipidemia Father   . Asthma Sister   . Heart disease Daughter   . Heart disease Brother   . Stroke Maternal Grandmother   . Dementia Paternal Grandmother     Review of Systems  Constitutional: Negative.   HENT: Positive for congestion.   Cardiovascular: Negative.   Gastrointestinal: Positive for diarrhea.  Endocrine: Positive for cold intolerance and heat intolerance.  Genitourinary:       Loss of urine spontaneously  Loss of urine with sneeze or cough  Pain with intercourse Loss of sexual interest   Musculoskeletal: Negative.   Skin: Negative.   Allergic/Immunologic: Negative.   Neurological: Negative.   Hematological: Negative.   Psychiatric/Behavioral: Negative.   All other systems reviewed and are negative. She leaks urine with valsalva (any pressure at all). Small amounts, can need to change her underwear and jeans. Can have to change 2-3 x a day or not at all. No urge incontinence. No frequency or urgency.  She does kegels  Exam:   BP 118/70 (BP Location: Right Arm, Patient Position: Sitting, Cuff Size: Normal)   Pulse 88   Resp 16   Ht 5\' 9"  (1.753 m)   Wt 168 lb (76.2 kg)   LMP 03/23/2018 (Approximate)   BMI 24.81 kg/m   Weight change: @WEIGHTCHANGE @ Height:   Height: 5\' 9"  (175.3 cm)  Ht Readings from Last 3 Encounters:  04/13/18 5\' 9"  (1.753 m)   10/29/17 5\' 9"  (1.753 m)  02/14/16 5\' 9"  (1.753 m)    General appearance: alert, cooperative and appears stated age Head: Normocephalic, without obvious abnormality, atraumatic Neck: no adenopathy, supple, symmetrical, trachea midline and thyroid normal to inspection and palpation Lungs: clear to auscultation bilaterally Cardiovascular: regular rate and rhythm Breasts: normal appearance, no masses or tenderness Abdomen: soft, non-tender; non distended,  no masses,  no organomegaly Extremities: extremities normal, atraumatic, no cyanosis or edema Skin: Skin  color, texture, turgor normal. No rashes or lesions Lymph nodes: Cervical, supraclavicular, and axillary nodes normal. No abnormal inguinal nodes palpated Neurologic: Grossly normal   Pelvic: External genitalia:  no lesions              Urethra:  normal appearing urethra with no masses, tenderness or lesions              Bartholins and Skenes: normal                 Vagina: normal appearing vagina with normal color and discharge, no lesions. She has a grade 1-2 cystocele and rectocele with valsalva              Cervix: no lesions               Bimanual Exam:  Uterus:  normal size, contour, position, consistency, mobility, non-tender and anteverted              Adnexa: no mass, fullness, tenderness               Rectovaginal: Confirms               Anus:  normal sphincter tone, no lesions  Chaperone was present for exam.  A:  Well Woman with normal exam  GSI, worsening  AUB, improved with micronor  Night sweats, helped with gabapentin 600 mg at HS  Yellow nipple d/c, prior ductogram  Pelvic aching  Mild pelvic prolapse, advised to avoid heavy lifting and straining  Pelvic aching, normal exam. Normal u/s in 12/18. If symptoms persist she will call. She will also pay attention to her IBS symptoms and pain   P:   Pap with hpv  STD testing  Prolactin  Send urine   Discussed kegels, PT and surgery  She may desire PT in the  future  Gabapentin 600 mg qhs (other wise taking prn for nerve pain)  Continue micronor. She may want to switch to the mirena, will call if she does.   Mammogram

## 2018-04-13 NOTE — Patient Instructions (Signed)
EXERCISE AND DIET:  We recommended that you start or continue a regular exercise program for good health. Regular exercise means any activity that makes your heart beat faster and makes you sweat.  We recommend exercising at least 30 minutes per day at least 3 days a week, preferably 4 or 5.  We also recommend a diet low in fat and sugar.  Inactivity, poor dietary choices and obesity can cause diabetes, heart attack, stroke, and kidney damage, among others.    ALCOHOL AND SMOKING:  Women should limit their alcohol intake to no more than 7 drinks/beers/glasses of wine (combined, not each!) per week. Moderation of alcohol intake to this level decreases your risk of breast cancer and liver damage. And of course, no recreational drugs are part of a healthy lifestyle.  And absolutely no smoking or even second hand smoke. Most people know smoking can cause heart and lung diseases, but did you know it also contributes to weakening of your bones? Aging of your skin?  Yellowing of your teeth and nails?  CALCIUM AND VITAMIN D:  Adequate intake of calcium and Vitamin D are recommended.  The recommendations for exact amounts of these supplements seem to change often, but generally speaking 600 mg of calcium (either carbonate or citrate) and 800 units of Vitamin D per day seems prudent. Certain women may benefit from higher intake of Vitamin D.  If you are among these women, your doctor will have told you during your visit.    PAP SMEARS:  Pap smears, to check for cervical cancer or precancers,  have traditionally been done yearly, although recent scientific advances have shown that most women can have pap smears less often.  However, every woman still should have a physical exam from her gynecologist every year. It will include a breast check, inspection of the vulva and vagina to check for abnormal growths or skin changes, a visual exam of the cervix, and then an exam to evaluate the size and shape of the uterus and  ovaries.  And after 47 years of age, a rectal exam is indicated to check for rectal cancers. We will also provide age appropriate advice regarding health maintenance, like when you should have certain vaccines, screening for sexually transmitted diseases, bone density testing, colonoscopy, mammograms, etc.   MAMMOGRAMS:  All women over 40 years old should have a yearly mammogram. Many facilities now offer a "3D" mammogram, which may cost around $50 extra out of pocket. If possible,  we recommend you accept the option to have the 3D mammogram performed.  It both reduces the number of women who will be called back for extra views which then turn out to be normal, and it is better than the routine mammogram at detecting truly abnormal areas.    COLONOSCOPY:  Colonoscopy to screen for colon cancer is recommended for all women at age 50.  We know, you hate the idea of the prep.  We agree, BUT, having colon cancer and not knowing it is worse!!  Colon cancer so often starts as a polyp that can be seen and removed at colonscopy, which can quite literally save your life!  And if your first colonoscopy is normal and you have no family history of colon cancer, most women don't have to have it again for 10 years.  Once every ten years, you can do something that may end up saving your life, right?  We will be happy to help you get it scheduled when you are ready.    Be sure to check your insurance coverage so you understand how much it will cost.  It may be covered as a preventative service at no cost, but you should check your particular policy.      Kegel Exercises Kegel exercises help strengthen the muscles that support the rectum, vagina, small intestine, bladder, and uterus. Doing Kegel exercises can help:  Improve bladder and bowel control.  Improve sexual response.  Reduce problems and discomfort during pregnancy.  Kegel exercises involve squeezing your pelvic floor muscles, which are the same muscles you  squeeze when you try to stop the flow of urine. The exercises can be done while sitting, standing, or lying down, but it is best to vary your position. Phase 1 exercises 1. Squeeze your pelvic floor muscles tight. You should feel a tight lift in your rectal area. If you are a female, you should also feel a tightness in your vaginal area. Keep your stomach, buttocks, and legs relaxed. 2. Hold the muscles tight for up to 10 seconds. 3. Relax your muscles. Repeat this exercise 50 times a day or as many times as told by your health care provider. Continue to do this exercise for at least 4-6 weeks or for as long as told by your health care provider. This information is not intended to replace advice given to you by your health care provider. Make sure you discuss any questions you have with your health care provider. Document Released: 11/25/2012 Document Revised: 08/03/2016 Document Reviewed: 10/29/2015 Elsevier Interactive Patient Education  2018 [employer]  

## 2018-04-14 LAB — CYTOLOGY - PAP
Chlamydia: NEGATIVE
Diagnosis: NEGATIVE
HPV (WINDOPATH): NOT DETECTED
NEISSERIA GONORRHEA: NEGATIVE

## 2018-04-14 LAB — HEP, RPR, HIV PANEL
HIV Screen 4th Generation wRfx: NONREACTIVE
Hepatitis B Surface Ag: NEGATIVE
RPR Ser Ql: NONREACTIVE

## 2018-04-14 LAB — URINALYSIS, MICROSCOPIC ONLY: Casts: NONE SEEN /lpf

## 2018-04-14 LAB — VITAMIN D 25 HYDROXY (VIT D DEFICIENCY, FRACTURES): Vit D, 25-Hydroxy: 26.3 ng/mL — ABNORMAL LOW (ref 30.0–100.0)

## 2018-04-14 LAB — PROLACTIN: Prolactin: 10.2 ng/mL (ref 4.8–23.3)

## 2018-04-15 ENCOUNTER — Telehealth: Payer: Self-pay | Admitting: Obstetrics and Gynecology

## 2018-04-15 LAB — URINE CULTURE

## 2018-04-15 NOTE — Telephone Encounter (Signed)
Patient is calling for recent lab results °

## 2018-04-15 NOTE — Telephone Encounter (Signed)
Please see result note 

## 2018-04-15 NOTE — Telephone Encounter (Signed)
Routing to Dr. Talbert Nan -please review labs and pap dated 04/13/18 and advise.

## 2018-04-16 NOTE — Telephone Encounter (Signed)
Left detailed message, ok per current dpr. Advised as seen below per Dr. Talbert Nan. Return call to office to schedule AEX or with any additional questions. Will close encounter.    Notes recorded by Salvadore Dom, MD on 04/15/2018 at 5:38 PM EDT Please inform the patient that her vit d is low, the rest of her lab work was normal. She should take 1,000 IU of vit d 3 daily (long term). 02 recall.

## 2018-04-17 ENCOUNTER — Ambulatory Visit: Payer: BLUE CROSS/BLUE SHIELD

## 2018-05-06 ENCOUNTER — Telehealth: Payer: Self-pay | Admitting: Obstetrics and Gynecology

## 2018-05-06 MED ORDER — GABAPENTIN 600 MG PO TABS: 600.0000 mg | ORAL_TABLET | Freq: Three times a day (TID) | ORAL | 0 refills | Status: DC

## 2018-05-06 NOTE — Telephone Encounter (Signed)
Return call to Kaitlyn. °

## 2018-05-06 NOTE — Telephone Encounter (Signed)
Spoke with patient, unable to talk, will return call to office.

## 2018-05-06 NOTE — Telephone Encounter (Signed)
Spoke with patient. Requesting Rx gabapentin 600 mg tid be [facility]. Patient Picked up 30 day supply, has 3 refills remaining.   Rx for gabapentin 600 mg tid cancelled. [facility] RX for Gabapentin 600 mg tid #[facility].  Patient verbalizes understanding and is agreeable.   [facility], spoke with Truman Hayward. 04/13/18 RX for Gabapentin 600 cancelled, advised new Rx sent for 90 day supply.   Routing to provider for final review. Patient is agreeable to disposition. Will close encounter.

## 2018-05-06 NOTE — Telephone Encounter (Signed)
Patient called requesting to speak with to speak with the nurse about changing her gabapentin dosing instructions.

## 2018-05-07 ENCOUNTER — Ambulatory Visit: Payer: BLUE CROSS/BLUE SHIELD

## 2018-05-22 ENCOUNTER — Ambulatory Visit: Payer: BLUE CROSS/BLUE SHIELD

## 2018-08-21 ENCOUNTER — Telehealth: Payer: Self-pay | Admitting: Obstetrics and Gynecology

## 2018-08-21 MED ORDER — FLUCONAZOLE 150 MG PO TABS: 150.0000 mg | ORAL_TABLET | Freq: Once | ORAL | 0 refills | Status: AC

## 2018-08-21 NOTE — Telephone Encounter (Signed)
Spoke with patient. Just finished her cycle. Reports two days of vaginal itching with white cottage cheese discharge. No fevers or pelvic pain. No STD concerns.  Requests Treatment with diflucan, states she has a history of this and Diflucan always "knocks it out!" Has not had yeast in awhile per patient.    Advised that for any further treatment she needs an office visit. Reviewed with Melvia Heaps CNM, okay for Diflucan 150 mg po x 1.  Patient agreeable to plan and will call back with any further symptoms for office visit.   Routing to [employer] CNM and will close.

## 2018-08-21 NOTE — Telephone Encounter (Signed)
Patient called and said she knows she has a yeast infection. She said she only can use Diflucan because of her medical history and requested a prescription be called in.  New pharmacy:  [address]

## 2018-10-26 ENCOUNTER — Other Ambulatory Visit: Payer: Self-pay | Admitting: Obstetrics and Gynecology

## 2018-10-26 NOTE — Telephone Encounter (Signed)
Spoke with patient. Patient states that she is taking Gabapentin once in the morning/early afternoon and then 2 tablets at night. Checked her rx bottle and did not realize she has been taking 600 mg tablets TID. Thought she was taking 200 mg TID. Reports her nerve pain has not worsened. Reports her night sweats and hot flashes have completely stopped since taking the Gabapentin at bedtime. Advised will review with Dr.Jertson and return call.

## 2018-10-26 NOTE — Telephone Encounter (Signed)
Please call the patient and figure out how much of the gabapentin she is actually taking. I had written at her annual exam that she was taking 600 mg at hs. Is she taking it 3 x a day?? I didn't originally prescribe it for her, but continued it because it was helping her night sweats. If her nerve pain is getting worse, she may need to f/u with the provider her manages that for her.

## 2018-10-26 NOTE — Telephone Encounter (Signed)
Medication refill request: Gabapentin 600 mg tablet take 1 tablet by mouth Last AEX:  04/13/2018 Next AEX: Not scheduled Last MMG (if hormonal medication request): N/A Refill authorized: Last given 05/06/2018 #[id number]  Please advise.

## 2018-10-26 NOTE — Telephone Encounter (Signed)
I think she should go back to the 300 mg tablets, 2 at night, then can take one tablet in the am and one in the afternoon as needed. She should be on the lowest dose that she needs.  Please change to the 300 mg tablets. She should discuss the dosing for her nerve pain with her other provider, if she needs to make changes.

## 2018-10-27 MED ORDER — GABAPENTIN 300 MG PO CAPS: ORAL_CAPSULE | ORAL | 5 refills | Status: DC

## 2018-10-27 NOTE — Telephone Encounter (Signed)
Spoke with patient. Advised of message as seen below from [place]. Patient verbalizes understanding. Encounter previously closed.

## 2018-10-27 NOTE — Telephone Encounter (Signed)
Rx changed to Gabapentin 300 mg take 2 tablets po nightly. May take 1 tablets in the morning and 1 tablet in the afternoon as needed.   Dr.Jertson, if patient takes 4 tablets a day she would need #120. Please advise the quantity and if its okay to fill until her next aex in 03/2019.

## 2019-01-18 ENCOUNTER — Ambulatory Visit (INDEPENDENT_AMBULATORY_CARE_PROVIDER_SITE_OTHER): Payer: Self-pay | Admitting: Orthopedic Surgery

## 2019-04-07 ENCOUNTER — Other Ambulatory Visit: Payer: Self-pay | Admitting: Orthopedic Surgery

## 2019-04-07 DIAGNOSIS — S62606A Fracture of unspecified phalanx of right little finger, initial encounter for closed fracture: Secondary | ICD-10-CM

## 2019-04-09 ENCOUNTER — Other Ambulatory Visit: Payer: Self-pay

## 2019-04-09 ENCOUNTER — Ambulatory Visit
Admission: RE | Admit: 2019-04-09 | Discharge: 2019-04-09 | Disposition: A | Discharge status: Home / Self Care | Payer: Medicaid Other | Source: Ambulatory Visit | Attending: Orthopedic Surgery | Admitting: Orthopedic Surgery

## 2019-04-09 DIAGNOSIS — S62606A Fracture of unspecified phalanx of right little finger, initial encounter for closed fracture: Secondary | ICD-10-CM

## 2019-05-07 IMAGING — CT CT OF THE RIGHT HAND WITHOUT CONTRAST
1 series · 12 of 14 positions shown, 15 images · non-contrast
Comparison: Right wrist x-rays dated September 26, 2013.

CLINICAL DATA: Fracture of the small finger.

EXAM:
CT OF THE RIGHT HAND WITHOUT CONTRAST
TECHNIQUE: Multidetector CT imaging of the right hand was performed according
to the standard protocol. Multiplanar CT image reconstructions were
also generated.

[Series 3: ext-soft · axial · 0.34mm/px · z∈[+48,+246]mm · 12 of 119 slices shown, 15 images]
[im 10/119  soft-tissue]
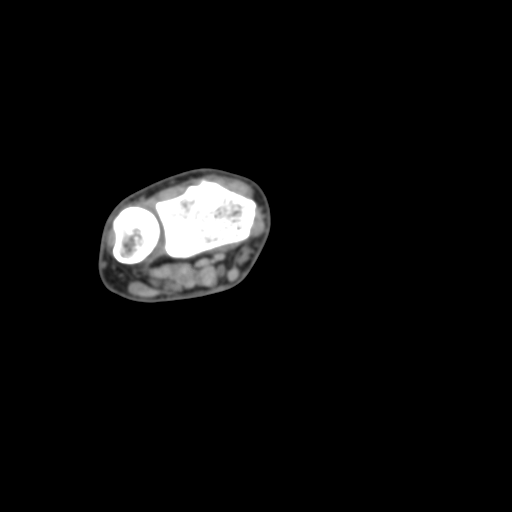
[im 10/119  bone]
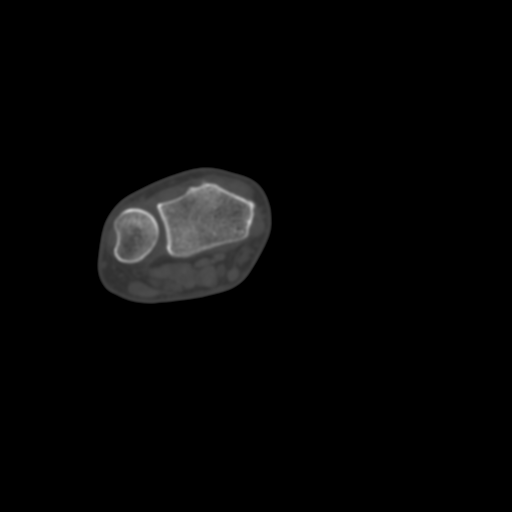
[im 19/119  bone]
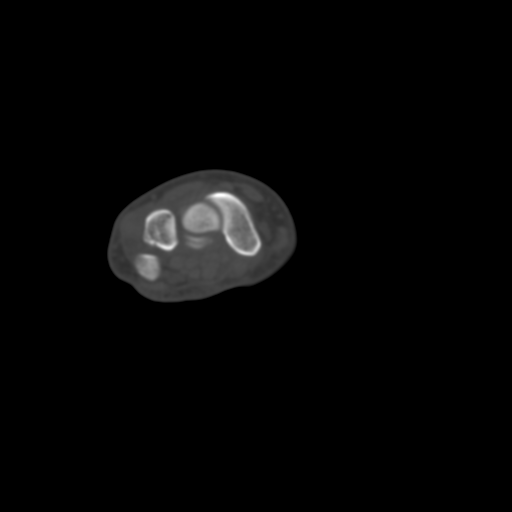
[im 28/119  bone]
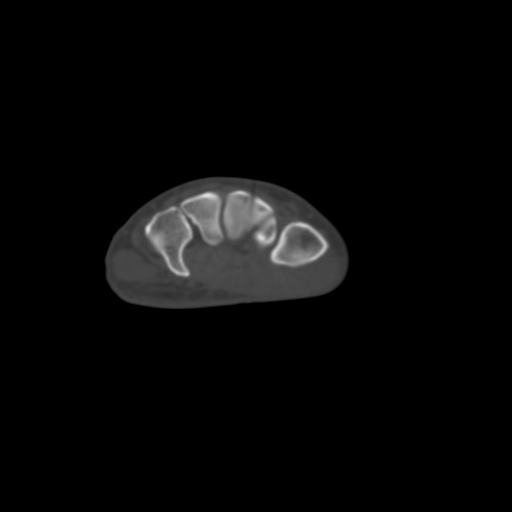
[im 37/119  bone]
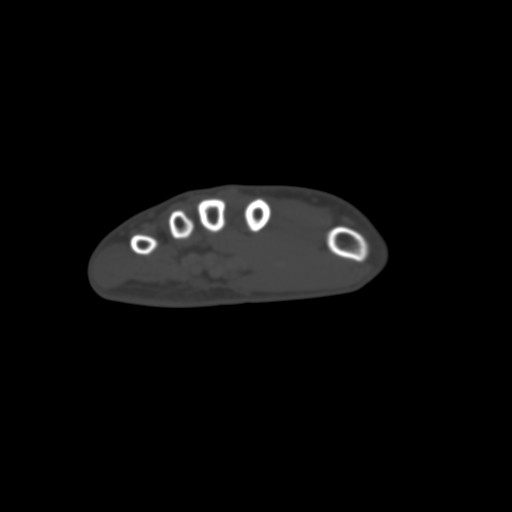
[im 46/119  soft-tissue]
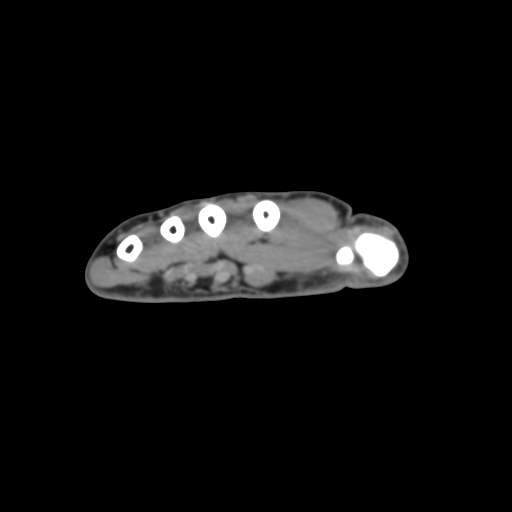
[im 46/119  bone]
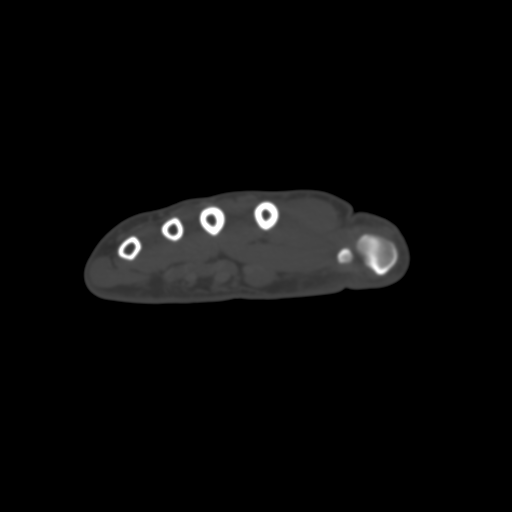
[im 55/119  bone]
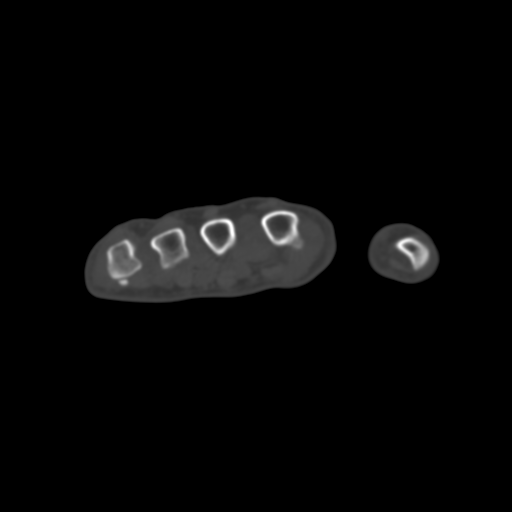
[im 64/119  bone]
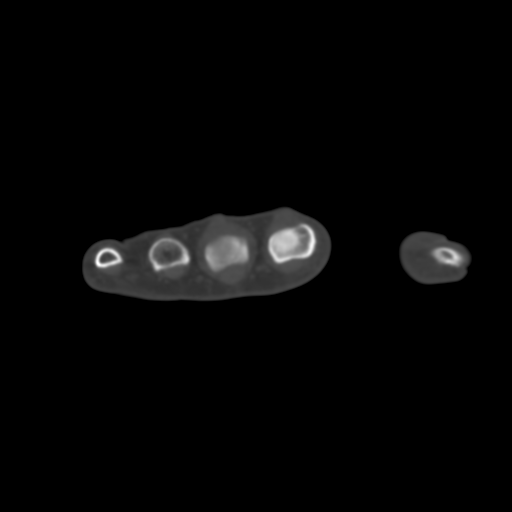
[im 73/119  bone]
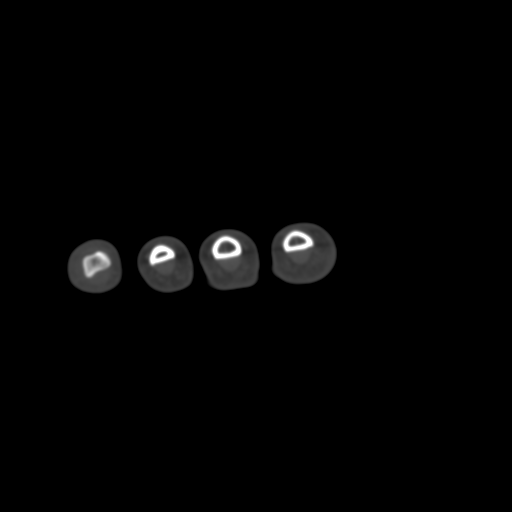
[im 82/119  soft-tissue]
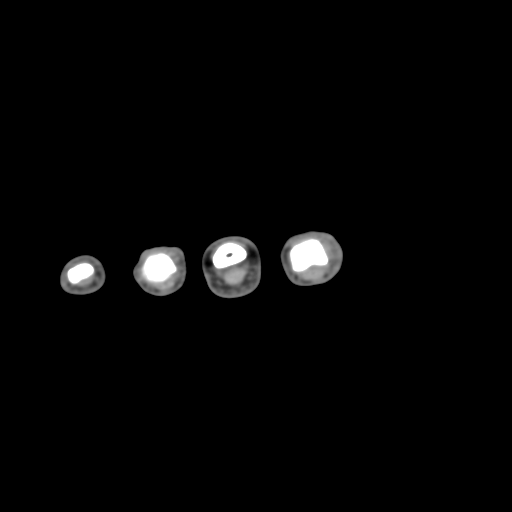
[im 82/119  bone]
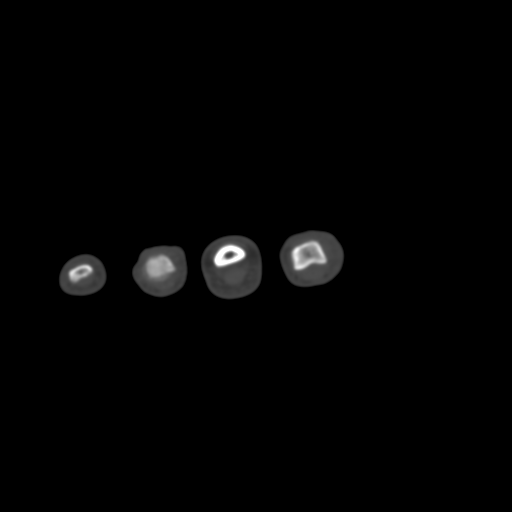
[im 91/119  bone]
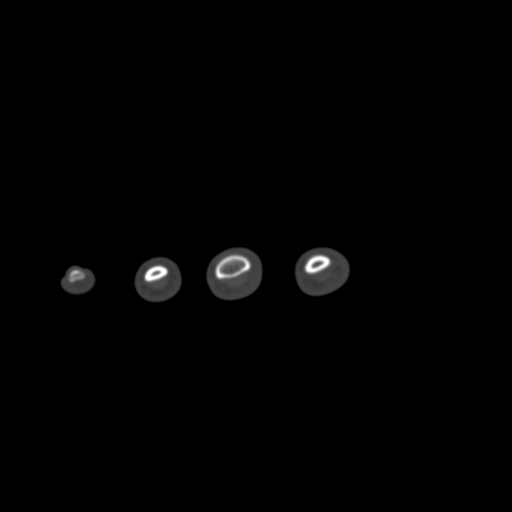
[im 100/119  bone]
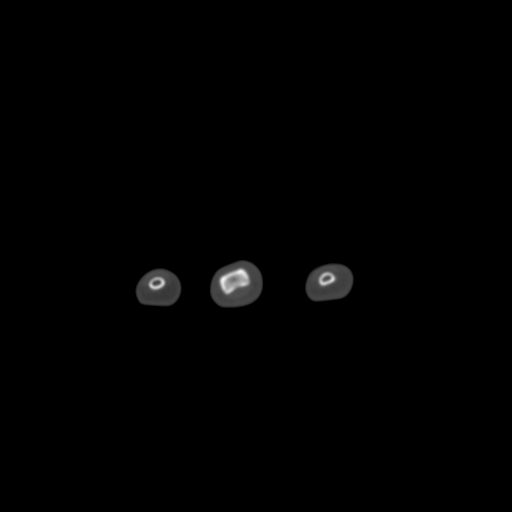
[im 109/119  bone]
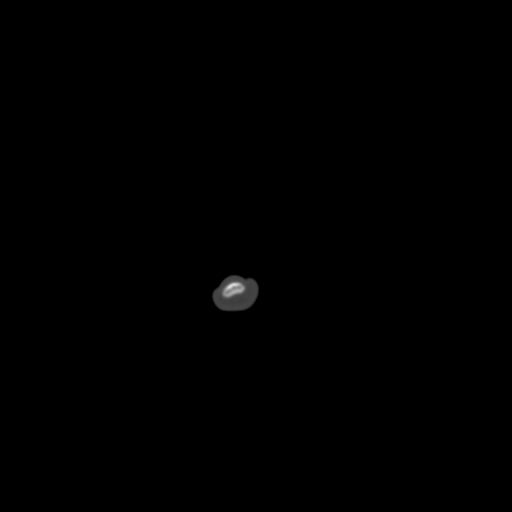

[12 of 14 positions shown; findings below may reference images not displayed]

FINDINGS: Bones/Joint/Cartilage

There is a tiny nondisplaced intra-articular fracture involving the
ulnar aspect of the fifth middle phalanx base. No additional
fracture. No dislocation. Joint spaces are preserved. Bone
mineralization is normal. No joint effusion.

Ligaments

Suboptimally assessed by CT.

Muscles and Tendons

Intact.

Soft tissues

Soft tissue swelling around the fifth PIP joint. No soft tissue mass
or fluid collection.
IMPRESSION: 1. Tiny nondisplaced intra-articular fracture of the fifth middle
phalanx base with surrounding soft tissue swelling.

## 2019-05-20 ENCOUNTER — Other Ambulatory Visit: Payer: Self-pay | Admitting: Obstetrics and Gynecology

## 2019-05-21 ENCOUNTER — Other Ambulatory Visit: Payer: Self-pay | Admitting: Obstetrics and Gynecology

## 2019-05-21 MED ORDER — NORETHINDRONE 0.35 MG PO TABS: 1.0000 | ORAL_TABLET | Freq: Every day | ORAL | 0 refills | Status: DC

## 2019-05-21 NOTE — Telephone Encounter (Signed)
Patient returning call. Mammogram is scheduled 6/25 at [facility].

## 2019-05-21 NOTE — Telephone Encounter (Signed)
Medication refill request: micronor Last AEX:  04-13-18 Next AEX: 06-30-2019 Last MMG (if hormonal medication request): 2017 Refill authorized: pt states she did not have mmg from 2019, pt states she had appt for 2020 but her whole household had covid 19 & it took them 5 weeks to get over it , so she had to cancel the mmg. Pt states she will call today & get an appt for the first available appt. Patient to call us back with the appt date. Patient states she took her last pill yesterday & asking if you can atleast send in 1 mth. Please see note from Alinda Sierras also regarding aex appt scheduled. Please approve if appropriate

## 2019-05-21 NOTE — Telephone Encounter (Signed)
Patient's refill request was denied due to needing an aex. She scheduled her aex to next available Tues, Wed, or Thurs after 1:30pm   06/30/2019. She stated that she will be out of this prescription today and really needs this today. [facility].

## 2019-05-21 NOTE — Telephone Encounter (Signed)
Please let the patient know that 3 packs were sent to her pharmacy. This script was refused because it is a duplicate.

## 2019-05-21 NOTE — Telephone Encounter (Signed)
Patient aware.

## 2019-05-21 NOTE — Telephone Encounter (Signed)
mmg scheduled 06/17/2019

## 2019-05-21 NOTE — Addendum Note (Signed)
Addended by: Dorothy Spark on: 05/21/2019 12:05 PM   Modules accepted: Orders

## 2019-06-14 ENCOUNTER — Other Ambulatory Visit: Payer: Self-pay | Admitting: Obstetrics and Gynecology

## 2019-06-14 NOTE — Telephone Encounter (Signed)
Patient is asking for a refill of gabapentin. [facility] [place]

## 2019-06-14 NOTE — Telephone Encounter (Signed)
Medication refill request: Gabapentin  Last AEX:  04/13/18 Next AEX 06/30/19 Last MMG (if hor lmonal medication request): 2017 Refill authorized:  #120 to get her to her AEX

## 2019-06-15 ENCOUNTER — Other Ambulatory Visit: Payer: Self-pay | Admitting: Obstetrics and Gynecology

## 2019-06-15 MED ORDER — GABAPENTIN 300 MG PO CAPS: ORAL_CAPSULE | ORAL | 0 refills | Status: DC

## 2019-06-30 ENCOUNTER — Ambulatory Visit: Payer: Medicaid Other | Admitting: Obstetrics and Gynecology

## 2019-06-30 NOTE — Progress Notes (Deleted)
48 y.o. [id number] Legally Separated White or Caucasian Not Hispanic or Latino female here for annual exam.      No LMP recorded.          Sexually active: {yes no:314532}  The current method of family planning is {contraception:315051}.    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 Has appt 04/17/18 Ductogram secondary to right nipple d/c (yellow) Colonoscopy:  2017 normal (was told to f/u in 5 years) BMD:   never TDaP:  2014 Gardasil: n/a   reports that she has been smoking cigarettes. She has a 3.75 pack-year smoking history. She has never used smokeless tobacco. She reports current alcohol use of about 2.0 - 3.0 standard drinks of alcohol per week. She reports that she does not use drugs.  Past Medical History:  Diagnosis Date  . Abnormal uterine bleeding   . Allergy   . Anxiety   . Arthritis   . Asthma   . Depression   . Fibroid   . GERD (gastroesophageal reflux disease)    tums prn  . Heart murmur   . PONV (postoperative nausea and vomiting)   . Ruptured disk   . Urinary incontinence     Past Surgical History:  Procedure Laterality Date  . ANOPLASTY    . APPENDECTOMY    . COLPOSCOPY    . DIAGNOSTIC LAPAROSCOPY     x3 for endometriosis  . GYNECOLOGIC CRYOSURGERY    . KNEE ARTHROSCOPY     right-x3  . LAPAROSCOPY  03/26/2012   Procedure: LAPAROSCOPY OPERATIVE;  Surgeon: Lovenia Kim, MD;  Location: [facility];  Service: Gynecology;  Laterality: N/A;  with ablation of endometriosis   . left hand surgery  03/11/12  . MOUTH SURGERY    . PELVIC LAPAROSCOPY    . SHOULDER ARTHROSCOPY  right  . TENDON EXPLORATION  03/11/2012   Procedure: TENDON EXPLORATION;  Surgeon: Wynonia Sours, MD;  Location: [facility];  Service: Orthopedics;  Laterality: Left;  EXPLORATION OF FLEXOR TENDON LEFT INDEX FINGER        Current Outpatient Medications  Medication Sig Dispense Refill  . baclofen (LIORESAL) 10 MG tablet TAKE 1 TABLET (10 MG TOTAL) BY MOUTH 3 (THREE) TIMES DAILY. 90 tablet 0  . buPROPion (WELLBUTRIN XL) 150 MG 24 hr tablet TAKE 1 TABLET BY MOUTH EVERY DAY IN THE MORNING  1  . clonazePAM (KLONOPIN) 1 MG tablet Take 1 mg at bedtime by mouth.    . Eluxadoline (VIBERZI) 100 MG TABS Take 1 tablet by mouth daily.    Marland Kitchen gabapentin (NEURONTIN) 300 MG capsule Take 2 tablets at night daily. May take 1 tablet in the morning and 1 tablet in the afternoon as needed. 120 capsule 0  . ibuprofen (ADVIL,MOTRIN) 200 MG tablet Take 200 mg by mouth every 6 (six) hours as needed.    . mirtazapine (REMERON) 15 MG tablet Take 15 mg by mouth at bedtime.     . sertraline (ZOLOFT) 100 MG tablet Take 100 mg by mouth.    . traMADol (ULTRAM) 50 MG tablet Take 1-2 tablets (50-100 mg total) by mouth every 6 (six) hours as needed. 120 tablet 1   No current facility-administered medications for this visit.     Family History  Problem Relation Age of Onset  . Heart disease Mother   .  Diabetes Mother   . Atrial fibrillation Mother   . Diabetes Father   . Hyperlipidemia Father   . Asthma Sister   . Heart disease Daughter   . Heart disease Brother   . Stroke Maternal Grandmother   . Dementia Paternal Grandmother     Review of Systems  Exam:   There were no vitals taken for this visit.  Weight change: @WEIGHTCHANGE @ Height:      Ht Readings from Last 3 Encounters:  04/13/18 5\' 9"  (1.753 m)  10/29/17 5\' 9"  (1.753 m)  02/14/16 5\' 9"  (1.753 m)    General appearance: alert, cooperative and appears stated age Head: Normocephalic, without obvious abnormality, atraumatic Neck: no adenopathy, supple, symmetrical, trachea midline and thyroid {CHL AMB PHY EX THYROID NORM DEFAULT:[phone]::"normal to inspection and palpation"} Lungs: clear to auscultation bilaterally Cardiovascular: regular rate and  rhythm Breasts: {Exam; breast:13139::"normal appearance, no masses or tenderness"} Abdomen: soft, non-tender; non distended,  no masses,  no organomegaly Extremities: extremities normal, atraumatic, no cyanosis or edema Skin: Skin color, texture, turgor normal. No rashes or lesions Lymph nodes: Cervical, supraclavicular, and axillary nodes normal. No abnormal inguinal nodes palpated Neurologic: Grossly normal   Pelvic: External genitalia:  no lesions              Urethra:  normal appearing urethra with no masses, tenderness or lesions              Bartholins and Skenes: normal                 Vagina: normal appearing vagina with normal color and discharge, no lesions              Cervix: {CHL AMB PHY EX CERVIX NORM DEFAULT:[phone]::"no lesions"}               Bimanual Exam:  Uterus:  {CHL AMB PHY EX UTERUS NORM [phone]::"normal size, contour, position, consistency, mobility, non-tender"}              Adnexa: {CHL AMB PHY EX ADNEXA NO MASS DEFAULT:[phone]::"no mass, fullness, tenderness"}               Rectovaginal: Confirms               Anus:  normal sphincter tone, no lesions  Chaperone was present for exam.  A:  Well Woman with normal exam  P:

## 2019-07-15 ENCOUNTER — Encounter: Payer: Self-pay | Admitting: Obstetrics and Gynecology

## 2019-08-04 ENCOUNTER — Other Ambulatory Visit: Payer: Self-pay | Admitting: Obstetrics and Gynecology

## 2019-08-04 ENCOUNTER — Ambulatory Visit: Payer: Medicaid Other | Admitting: Obstetrics and Gynecology

## 2019-08-04 MED ORDER — NORETHINDRONE 0.35 MG PO TABS: 1.0000 | ORAL_TABLET | Freq: Every day | ORAL | 0 refills | Status: DC

## 2019-08-04 NOTE — Telephone Encounter (Signed)
Medication refill request: Micronor  Last AEX:  04/13/18 Next AEX: 08/25/19 Last MMG (if hormonal medication request): 2017 Refill authorized: #1  Pack with 0 RF to get her to her AEX

## 2019-08-04 NOTE — Telephone Encounter (Signed)
Patient called to reschedule AEX for 08/25/2019. Patient stated that she is on her last week of her birth control and will need a refill. [facility] [address]

## 2019-08-04 NOTE — Progress Notes (Deleted)
48 y.o. [id number] Legally Separated White or Caucasian Not Hispanic or Latino female here for annual exam.      No LMP recorded.          Sexually active: {yes no:314532}  The current method of family planning is {contraception:315051}.    Exercising: {yes no:314532}  {types:19826} Smoker:  {YES P5382123  Health Maintenance: Pap:  04/13/18 neg. HR HPV:neg   09/09/16 Neg  History of abnormal Pap:  Yes, colpo 2008 MMG:  04/17/18  Colonoscopy:  2017 normal. F/u 5 years  BMD:   never TDaP:  2014 Gardasil: ***   reports that she has been smoking cigarettes. She has a 3.75 pack-year smoking history. She has never used smokeless tobacco. She reports current alcohol use of about 2.0 - 3.0 standard drinks of alcohol per week. She reports that she does not use drugs.  Past Medical History:  Diagnosis Date  . Abnormal uterine bleeding   . Allergy   . Anxiety   . Arthritis   . Asthma   . Depression   . Fibroid   . GERD (gastroesophageal reflux disease)    tums prn  . Heart murmur   . PONV (postoperative nausea and vomiting)   . Ruptured disk   . Urinary incontinence     Past Surgical History:  Procedure Laterality Date  . ANOPLASTY    . APPENDECTOMY    . COLPOSCOPY    . DIAGNOSTIC LAPAROSCOPY     x3 for endometriosis  . GYNECOLOGIC CRYOSURGERY    . KNEE ARTHROSCOPY     right-x3  . LAPAROSCOPY  03/26/2012   Procedure: LAPAROSCOPY OPERATIVE;  Surgeon: Lovenia Kim, MD;  Location: [facility];  Service: Gynecology;  Laterality: N/A;  with ablation of endometriosis   . left hand surgery  03/11/12  . MOUTH SURGERY    . PELVIC LAPAROSCOPY    . SHOULDER ARTHROSCOPY  right  . TENDON EXPLORATION  03/11/2012   Procedure: TENDON EXPLORATION;  Surgeon: Wynonia Sours, MD;  Location: [place];  Service: Orthopedics;  Laterality: Left;  EXPLORATION OF FLEXOR TENDON LEFT INDEX FINGER       Current Outpatient Medications  Medication Sig Dispense Refill  . baclofen (LIORESAL)  10 MG tablet TAKE 1 TABLET (10 MG TOTAL) BY MOUTH 3 (THREE) TIMES DAILY. 90 tablet 0  . buPROPion (WELLBUTRIN XL) 150 MG 24 hr tablet TAKE 1 TABLET BY MOUTH EVERY DAY IN THE MORNING  1  . clonazePAM (KLONOPIN) 1 MG tablet Take 1 mg at bedtime by mouth.    . Eluxadoline (VIBERZI) 100 MG TABS Take 1 tablet by mouth daily.    Marland Kitchen gabapentin (NEURONTIN) 300 MG capsule Take 2 tablets at night daily. May take 1 tablet in the morning and 1 tablet in the afternoon as needed. 120 capsule 0  . ibuprofen (ADVIL,MOTRIN) 200 MG tablet Take 200 mg by mouth every 6 (six) hours as needed.    . mirtazapine (REMERON) 15 MG tablet Take 15 mg by mouth at bedtime.     . sertraline (ZOLOFT) 100 MG tablet Take 100 mg by mouth.    . traMADol (ULTRAM) 50 MG tablet Take 1-2 tablets (50-100 mg total) by mouth every 6 (six) hours as needed. 120 tablet 1   No current facility-administered medications for this visit.     Family History  Problem Relation Age of Onset  . Heart disease Mother   . Diabetes Mother   . Atrial fibrillation Mother   .  Diabetes Father   . Hyperlipidemia Father   . Asthma Sister   . Heart disease Daughter   . Heart disease Brother   . Stroke Maternal Grandmother   . Dementia Paternal Grandmother     Review of Systems  Exam:   There were no vitals taken for this visit.  Weight change: @WEIGHTCHANGE @ Height:      Ht Readings from Last 3 Encounters:  04/13/18 5\' 9"  (1.753 m)  10/29/17 5\' 9"  (1.753 m)  02/14/16 5\' 9"  (1.753 m)    General appearance: alert, cooperative and appears stated age Head: Normocephalic, without obvious abnormality, atraumatic Neck: no adenopathy, supple, symmetrical, trachea midline and thyroid {CHL AMB PHY EX THYROID NORM DEFAULT:[phone]::"normal to inspection and palpation"} Lungs: clear to auscultation bilaterally Cardiovascular: regular rate and rhythm Breasts: {Exam; breast:13139::"normal appearance, no masses or tenderness"} Abdomen: soft, non-tender;  non distended,  no masses,  no organomegaly Extremities: extremities normal, atraumatic, no cyanosis or edema Skin: Skin color, texture, turgor normal. No rashes or lesions Lymph nodes: Cervical, supraclavicular, and axillary nodes normal. No abnormal inguinal nodes palpated Neurologic: Grossly normal   Pelvic: External genitalia:  no lesions              Urethra:  normal appearing urethra with no masses, tenderness or lesions              Bartholins and Skenes: normal                 Vagina: normal appearing vagina with normal color and discharge, no lesions              Cervix: {CHL AMB PHY EX CERVIX NORM DEFAULT:[phone]::"no lesions"}               Bimanual Exam:  Uterus:  {CHL AMB PHY EX UTERUS NORM DEFAULT:[phone]::"normal size, contour, position, consistency, mobility, non-tender"}              Adnexa: {CHL AMB PHY EX ADNEXA NO MASS [phone]::"no mass, fullness, tenderness"}               Rectovaginal: Confirms               Anus:  normal sphincter tone, no lesions  Chaperone was present for exam.  A:  Well Woman with normal exam  P:

## 2019-08-09 ENCOUNTER — Other Ambulatory Visit: Payer: Self-pay | Admitting: Obstetrics and Gynecology

## 2019-08-09 NOTE — Telephone Encounter (Signed)
Medication refill request: Gabapentin  Last AEX:  04/13/18 JJ Next AEX: 08/25/19  Last MMG (if hormonal medication request): 2017 Refill authorized: Please advise on refill; Order pended #120 w/0 refills if authorized

## 2019-08-11 ENCOUNTER — Other Ambulatory Visit: Payer: Self-pay | Admitting: Obstetrics and Gynecology

## 2019-08-11 MED ORDER — NORETHINDRONE 0.35 MG PO TABS: 1.0000 | ORAL_TABLET | Freq: Every day | ORAL | 0 refills | Status: DC

## 2019-08-11 NOTE — Telephone Encounter (Signed)
Patient requesting refill on birth control [facility]or. [facility] [address] [phone].

## 2019-08-11 NOTE — Telephone Encounter (Signed)
Medication refill request: Micronor  Last AEX:  04/13/18 Next AEX: 08/25/2019 Last MMG (if hormonal medication request): 07/15/19 bi-rads 1 neg  Refill authorized: 1 pack 0 rf

## 2019-08-17 NOTE — Progress Notes (Signed)
48 y.o. [id number] Legally Separated White or Caucasian Not Hispanic or Latino female here for annual exam.    She had a negative evaluation for AUB at the end of 12/18 and was started on Micronor.  Cycles are irregular for the last few months. It will go back and forth from normal q month, to q 2-4 weeks. Cycles can last for 5-10 days (last 3 months have lasted for 10 days). Never heavy. Cramps are getting worse, still tolerable. H/O endometriosis.  Gabapentin is helping her night sweats, very sensitive to the heat.  Sexually active, occasional discomfort.   Period Duration (Days): 10 days Period Pattern: (!) Irregular Menstrual Flow: Light Menstrual Control: Tampon Menstrual Control Change Freq (Hours): changes tampon ever 6-8 hours Dysmenorrhea: (!) Moderate Dysmenorrhea Symptoms: Cramping  Patient's last menstrual period was 07/28/2019 (exact date).          Sexually active: Yes.    The current method of family planning is POP.    Exercising: Yes.    exercise at work, walking Smoker:  Yes, 1PPD  Health Maintenance: Pap:  04/13/2018 WNL NEG HPV, 09/09/16 WNL History of abnormal Pap:  Yes, colposcopy 2008 MMG:  07/15/2019 Birads 1 negative Colonoscopy:  2017 normal (was told to f/u in 5 years) BMD:   never TDaP:  2014 Gardasil: n/a     reports that she has been smoking cigarettes. She has a 15.00 pack-year smoking history. She has never used smokeless tobacco. She reports current alcohol use of about 2.0 - 3.0 standard drinks of alcohol per week. She reports that she does not use drugs. She is working as Building control surveyor.   Past Medical History:  Diagnosis Date  . Abnormal uterine bleeding   . Allergy   . Anxiety   . Arthritis   . Asthma   . Depression   . Fibroid   . GERD (gastroesophageal reflux disease)    tums prn  . Heart murmur   . PONV (postoperative nausea and vomiting)   . Ruptured disk   . Urinary incontinence     Past Surgical History:  Procedure Laterality Date  .  ANOPLASTY    . APPENDECTOMY    . COLPOSCOPY    . DIAGNOSTIC LAPAROSCOPY     x3 for endometriosis  . GYNECOLOGIC CRYOSURGERY    . KNEE ARTHROSCOPY     right-x3  . LAPAROSCOPY  03/26/2012   Procedure: LAPAROSCOPY OPERATIVE;  Surgeon: Lovenia Kim, MD;  Location: [facility];  Service: Gynecology;  Laterality: N/A;  with ablation of endometriosis   . left hand surgery  03/11/12  . MOUTH SURGERY    . PELVIC LAPAROSCOPY    . SHOULDER ARTHROSCOPY  right  . TENDON EXPLORATION  03/11/2012   Procedure: TENDON EXPLORATION;  Surgeon: Wynonia Sours, MD;  Location: [facility];  Service: Orthopedics;  Laterality: Left;  EXPLORATION OF FLEXOR TENDON LEFT INDEX FINGER       Current Outpatient Medications  Medication Sig Dispense Refill  . clonazePAM (KLONOPIN) 1 MG tablet Take 1 mg by mouth at bedtime. Take 1/2 tablet at bedtime.    . DULoxetine HCl 40 MG CPEP Take 1 tablet by mouth daily.    . Eluxadoline (VIBERZI) 100 MG TABS Take 1 tablet by mouth daily.    Marland Kitchen gabapentin (NEURONTIN) 300 MG capsule TAKE 1 CAPSULE BY MOUTH IN THE MORNING,1 CAP IN THE AFTERNOON, AND 2 CAPSULES AT NIGHT AS NEEDED 120 capsule 0  . ibuprofen (ADVIL,MOTRIN) 200 MG tablet Take  200 mg by mouth every 6 (six) hours as needed.    . norethindrone (MICRONOR) 0.35 MG tablet Take 1 tablet (0.35 mg total) by mouth daily. 1 Package 0  . sucralfate (CARAFATE) 1 g tablet Take 1 g by mouth as needed.    . traMADol (ULTRAM) 50 MG tablet Take 1-2 tablets (50-100 mg total) by mouth every 6 (six) hours as needed. 120 tablet 1  . traZODone (DESYREL) 50 MG tablet Take 50-100 mg by mouth at bedtime.     No current facility-administered medications for this visit.     Family History  Problem Relation Age of Onset  . Heart disease Mother   . Diabetes Mother   . Atrial fibrillation Mother   . Diabetes Father   . Hyperlipidemia Father   . Asthma Sister   . Heart disease Daughter   . Heart disease Brother   . Stroke Maternal  Grandmother   . Dementia Paternal Grandmother     Review of Systems  Constitutional:       Lack of motivation  HENT: Negative.   Eyes: Negative.   Respiratory: Negative.   Cardiovascular: Negative.   Gastrointestinal: Negative.   Endocrine: Negative.   Genitourinary: Positive for menstrual problem.  Musculoskeletal: Negative.   Skin: Negative.   Allergic/Immunologic: Negative.   Neurological: Negative.   Hematological: Negative.   Psychiatric/Behavioral: Negative.   C/O worsening GSI, leaking daily.   Exam:   BP 116/82 (BP Location: Right Arm, Patient Position: Sitting, Cuff Size: Normal)   Pulse 80   Temp (!) 97.2 F (36.2 C) (Skin)   Ht 5' 9.25" (1.759 m)   Wt 166 lb (75.3 kg)   LMP 07/28/2019 (Exact Date)   BMI 24.34 kg/m   Weight change: @WEIGHTCHANGE @ Height:   Height: 5' 9.25" (175.9 cm)  Ht Readings from Last 3 Encounters:  08/25/19 5' 9.25" (1.759 m)  04/13/18 5\' 9"  (1.753 m)  10/29/17 5\' 9"  (1.753 m)    General appearance: alert, cooperative and appears stated age Head: Normocephalic, without obvious abnormality, atraumatic Neck: no adenopathy, supple, symmetrical, trachea midline and thyroid normal to inspection and palpation Lungs: clear to auscultation bilaterally Cardiovascular: regular rate and rhythm Breasts: normal appearance, no masses or tenderness Abdomen: soft, non-tender; non distended,  no masses,  no organomegaly Extremities: extremities normal, atraumatic, no cyanosis or edema Skin: Skin color, texture, turgor normal. No rashes or lesions Lymph nodes: Cervical, supraclavicular, and axillary nodes normal. No abnormal inguinal nodes palpated Neurologic: Grossly normal   Pelvic: External genitalia:  no lesions              Urethra:  normal appearing urethra with no masses, tenderness or lesions              Bartholins and Skenes: normal                 Vagina: normal appearing vagina with normal color and discharge, no lesions. Kegel  strength 1-2/5              Cervix: no lesions               Bimanual Exam:  Uterus:  normal size, contour, position, consistency, mobility, non-tender              Adnexa: no mass, fullness, tenderness               Rectovaginal: Confirms               Anus:  normal sphincter tone, no lesions  Chaperone was present for exam.  A:  Well Woman with normal exam  Contraception on the minipill, desires the mirena IUD  Vasomotor symptoms, helped with gabapentin  GSI, worsening in the last year    P:   No pap this year  Mammogram is UTD  Labs with primary  Interested in the [place] IUD  Discussed breast self exam  Discussed calcium and vit D intake  Continue gabapentin for vasomotor symptoms  Urine for ua, c&s  Discussed Kegels, she is doing them, poor kegel strength  Referral placed for pelvic floor PT (discussed options of do nothing, just kegels, PT and surgery)

## 2019-08-24 ENCOUNTER — Other Ambulatory Visit: Payer: Self-pay

## 2019-08-25 ENCOUNTER — Ambulatory Visit: Payer: Medicaid Other | Admitting: Obstetrics and Gynecology

## 2019-08-25 ENCOUNTER — Encounter: Payer: Self-pay | Admitting: Obstetrics and Gynecology

## 2019-08-25 VITALS — BP 116/82 | HR 80 | Temp 97.2°F | Ht 69.25 in | Wt 166.0 lb

## 2019-08-25 DIAGNOSIS — R61 Generalized hyperhidrosis: Secondary | ICD-10-CM | POA: Diagnosis not present

## 2019-08-25 DIAGNOSIS — Z0000 Encounter for general adult medical examination without abnormal findings: Secondary | ICD-10-CM | POA: Diagnosis not present

## 2019-08-25 DIAGNOSIS — Z3009 Encounter for other general counseling and advice on contraception: Secondary | ICD-10-CM | POA: Diagnosis not present

## 2019-08-25 DIAGNOSIS — N393 Stress incontinence (female) (male): Secondary | ICD-10-CM

## 2019-08-25 DIAGNOSIS — Z01419 Encounter for gynecological examination (general) (routine) without abnormal findings: Secondary | ICD-10-CM

## 2019-08-25 MED ORDER — GABAPENTIN 300 MG PO CAPS: ORAL_CAPSULE | ORAL | 3 refills | Status: DC

## 2019-08-25 MED ORDER — NORETHINDRONE 0.35 MG PO TABS: 1.0000 | ORAL_TABLET | Freq: Every day | ORAL | 3 refills | Status: DC

## 2019-08-25 NOTE — Patient Instructions (Signed)
EXERCISE AND DIET:  We recommended that you start or continue a regular exercise program for good health. Regular exercise means any activity that makes your heart beat faster and makes you sweat.  We recommend exercising at least 30 minutes per day at least 3 days a week, preferably 4 or 5.  We also recommend a diet low in fat and sugar.  Inactivity, poor dietary choices and obesity can cause diabetes, heart attack, stroke, and kidney damage, among others.    ALCOHOL AND SMOKING:  Women should limit their alcohol intake to no more than 7 drinks/beers/glasses of wine (combined, not each!) per week. Moderation of alcohol intake to this level decreases your risk of breast cancer and liver damage. And of course, no recreational drugs are part of a healthy lifestyle.  And absolutely no smoking or even second hand smoke. Most people know smoking can cause heart and lung diseases, but did you know it also contributes to weakening of your bones? Aging of your skin?  Yellowing of your teeth and nails?  CALCIUM AND VITAMIN D:  Adequate intake of calcium and Vitamin D are recommended.  The recommendations for exact amounts of these supplements seem to change often, but generally speaking 1,000 mg of calcium (between diet and supplement) and 800 units of Vitamin D per day seems prudent. Certain women may benefit from higher intake of Vitamin D.  If you are among these women, your doctor will have told you during your visit.    PAP SMEARS:  Pap smears, to check for cervical cancer or precancers,  have traditionally been done yearly, although recent scientific advances have shown that most women can have pap smears less often.  However, every woman still should have a physical exam from her gynecologist every year. It will include a breast check, inspection of the vulva and vagina to check for abnormal growths or skin changes, a visual exam of the cervix, and then an exam to evaluate the size and shape of the uterus and  ovaries.  And after 48 years of age, a rectal exam is indicated to check for rectal cancers. We will also provide age appropriate advice regarding health maintenance, like when you should have certain vaccines, screening for sexually transmitted diseases, bone density testing, colonoscopy, mammograms, etc.   MAMMOGRAMS:  All women over 40 years old should have a yearly mammogram. Many facilities now offer a "3D" mammogram, which may cost around $50 extra out of pocket. If possible,  we recommend you accept the option to have the 3D mammogram performed.  It both reduces the number of women who will be called back for extra views which then turn out to be normal, and it is better than the routine mammogram at detecting truly abnormal areas.    COLON CANCER SCREENING: Now recommend starting at age 45. At this time colonoscopy is not covered for routine screening until 50. There are take home tests that can be done between 45-49.   COLONOSCOPY:  Colonoscopy to screen for colon cancer is recommended for all women at age 50.  We know, you hate the idea of the prep.  We agree, BUT, having colon cancer and not knowing it is worse!!  Colon cancer so often starts as a polyp that can be seen and removed at colonscopy, which can quite literally save your life!  And if your first colonoscopy is normal and you have no family history of colon cancer, most women don't have to have it again for   10 years.  Once every ten years, you can do something that may end up saving your life, right?  We will be happy to help you get it scheduled when you are ready.  Be sure to check your insurance coverage so you understand how much it will cost.  It may be covered as a preventative service at no cost, but you should check your particular policy.      Breast Self-Awareness Breast self-awareness means being familiar with how your breasts look and feel. It involves checking your breasts regularly and reporting any changes to your  health care provider. Practicing breast self-awareness is important. A change in your breasts can be a sign of a serious medical problem. Being familiar with how your breasts look and feel allows you to find any problems early, when treatment is more likely to be successful. All women should practice breast self-awareness, including women who have had breast implants. How to do a breast self-exam One way to learn what is normal for your breasts and whether your breasts are changing is to do a breast self-exam. To do a breast self-exam: Look for Changes  1. Remove all the clothing above your waist. 2. Stand in front of a mirror in a room with good lighting. 3. Put your hands on your hips. 4. Push your hands firmly downward. 5. Compare your breasts in the mirror. Look for differences between them (asymmetry), such as: ? Differences in shape. ? Differences in size. ? Puckers, dips, and bumps in one breast and not the other. 6. Look at each breast for changes in your skin, such as: ? Redness. ? Scaly areas. 7. Look for changes in your nipples, such as: ? Discharge. ? Bleeding. ? Dimpling. ? Redness. ? A change in position. Feel for Changes Carefully feel your breasts for lumps and changes. It is best to do this while lying on your back on the floor and again while sitting or standing in the shower or tub with soapy water on your skin. Feel each breast in the following way:  Place the arm on the side of the breast you are examining above your head.  Feel your breast with the other hand.  Start in the nipple area and make  inch (2 cm) overlapping circles to feel your breast. Use the pads of your three middle fingers to do this. Apply light pressure, then medium pressure, then firm pressure. The light pressure will allow you to feel the tissue closest to the skin. The medium pressure will allow you to feel the tissue that is a little deeper. The firm pressure will allow you to feel the tissue  close to the ribs.  Continue the overlapping circles, moving downward over the breast until you feel your ribs below your breast.  Move one finger-width toward the center of the body. Continue to use the  inch (2 cm) overlapping circles to feel your breast as you move slowly up toward your collarbone.  Continue the up and down exam using all three pressures until you reach your armpit.  Write Down What You Find  Write down what is normal for each breast and any changes that you find. Keep a written record with breast changes or normal findings for each breast. By writing this information down, you do not need to depend only on memory for size, tenderness, or location. Write down where you are in your menstrual cycle, if you are still menstruating. If you are having trouble noticing differences   in your breasts, do not get discouraged. With time you will become more familiar with the variations in your breasts and more comfortable with the exam. How often should I examine my breasts? Examine your breasts every month. If you are breastfeeding, the best time to examine your breasts is after a feeding or after using a breast pump. If you menstruate, the best time to examine your breasts is 5-7 days after your period is over. During your period, your breasts are lumpier, and it may be more difficult to notice changes. When should I see my health care provider? See your health care provider if you notice:  A change in shape or size of your breasts or nipples.  A change in the skin of your breast or nipples, such as a reddened or scaly area.  Unusual discharge from your nipples.  A lump or thick area that was not there before.  Pain in your breasts.  Anything that concerns you.  Kegel Exercises  Kegel exercises can help strengthen your pelvic floor muscles. The pelvic floor is a group of muscles that support your rectum, small intestine, and bladder. In females, pelvic floor muscles also help  support the womb (uterus). These muscles help you control the flow of urine and stool. Kegel exercises are painless and simple, and they do not require any equipment. Your provider may suggest Kegel exercises to:  Improve bladder and bowel control.  Improve sexual response.  Improve weak pelvic floor muscles after surgery to remove the uterus (hysterectomy) or pregnancy (females).  Improve weak pelvic floor muscles after prostate gland removal or surgery (males). Kegel exercises involve squeezing your pelvic floor muscles, which are the same muscles you squeeze when you try to stop the flow of urine or keep from passing gas. The exercises can be done while sitting, standing, or lying down, but it is best to vary your position. Exercises How to do Kegel exercises: 1. Squeeze your pelvic floor muscles tight. You should feel a tight lift in your rectal area. If you are a female, you should also feel a tightness in your vaginal area. Keep your stomach, buttocks, and legs relaxed. 2. Hold the muscles tight for up to 10 seconds. 3. Breathe normally. 4. Relax your muscles. 5. Repeat as told by your health care provider. Repeat this exercise daily as told by your health care provider. Continue to do this exercise for at least 4-6 weeks, or for as long as told by your health care provider. You may be referred to a physical therapist who can help you learn more about how to do Kegel exercises. Depending on your condition, your health care provider may recommend:  Varying how long you squeeze your muscles.  Doing several sets of exercises every day.  Doing exercises for several weeks.  Making Kegel exercises a part of your regular exercise routine. This information is not intended to replace advice given to you by your health care provider. Make sure you discuss any questions you have with your health care provider. Document Released: 11/25/2012 Document Revised: 07/29/2018 Document Reviewed:  07/29/2018 Elsevier Patient Education  2020 [employer].

## 2019-08-26 LAB — URINALYSIS, MICROSCOPIC ONLY: Casts: NONE SEEN /lpf

## 2019-08-27 LAB — URINE CULTURE

## 2019-08-31 ENCOUNTER — Telehealth: Payer: Self-pay | Admitting: Obstetrics and Gynecology

## 2019-08-31 NOTE — Telephone Encounter (Signed)
Call placed to convey benefits for Mirena. °

## 2019-09-08 ENCOUNTER — Other Ambulatory Visit: Payer: Self-pay | Admitting: *Deleted

## 2019-09-08 DIAGNOSIS — N393 Stress incontinence (female) (male): Secondary | ICD-10-CM

## 2019-09-17 ENCOUNTER — Ambulatory Visit: Payer: Medicaid Other | Attending: Obstetrics and Gynecology | Admitting: Physical Therapy

## 2019-09-22 ENCOUNTER — Ambulatory Visit: Payer: Medicaid Other | Admitting: Physical Therapy

## 2019-09-30 ENCOUNTER — Encounter: Payer: Self-pay | Admitting: Physical Therapy

## 2019-09-30 ENCOUNTER — Ambulatory Visit: Payer: Medicaid Other | Attending: Obstetrics and Gynecology | Admitting: Physical Therapy

## 2019-09-30 ENCOUNTER — Other Ambulatory Visit: Payer: Self-pay

## 2019-09-30 DIAGNOSIS — M6281 Muscle weakness (generalized): Secondary | ICD-10-CM | POA: Diagnosis not present

## 2019-09-30 DIAGNOSIS — R279 Unspecified lack of coordination: Secondary | ICD-10-CM

## 2019-09-30 DIAGNOSIS — N393 Stress incontinence (female) (male): Secondary | ICD-10-CM | POA: Insufficient documentation

## 2019-09-30 NOTE — Therapy (Signed)
[facility] [address] [place], [place], [address] Phone: [phone]   Fax:  [phone]  Physical Therapy Evaluation  Patient Details  Name: Patricia Newton MRN: [id number] Date of Birth: 1971-10-20 Referring Provider (PT): Dr. Salvadore Dom   Encounter Date: 09/30/2019  PT End of Session - 09/30/19 1133    Visit Number  1    Date for PT Re-Evaluation  12/23/19    Authorization Type  Medicaid    PT Start Time  1100    PT Stop Time  1133    PT Time Calculation (min)  33 min    Activity Tolerance  Patient tolerated treatment well;No increased pain    Behavior During Therapy  WFL for tasks assessed/performed       Past Medical History:  Diagnosis Date  . Abnormal uterine bleeding   . Allergy   . Anxiety   . Arthritis   . Asthma   . Depression   . Fibroid   . GERD (gastroesophageal reflux disease)    tums prn  . Heart murmur   . PONV (postoperative nausea and vomiting)   . Ruptured disk   . Urinary incontinence     Past Surgical History:  Procedure Laterality Date  . ANOPLASTY    . APPENDECTOMY    . COLPOSCOPY    . DIAGNOSTIC LAPAROSCOPY     x3 for endometriosis  . GYNECOLOGIC CRYOSURGERY    . KNEE ARTHROSCOPY     right-x3  . LAPAROSCOPY  03/26/2012   Procedure: LAPAROSCOPY OPERATIVE;  Surgeon: Lovenia Kim, MD;  Location: [facility];  Service: Gynecology;  Laterality: N/A;  with ablation of endometriosis   . left hand surgery  03/11/12  . MOUTH SURGERY    . PELVIC LAPAROSCOPY    . SHOULDER ARTHROSCOPY  right  . TENDON EXPLORATION  03/11/2012   Procedure: TENDON EXPLORATION;  Surgeon: Wynonia Sours, MD;  Location: [facility];  Service: Orthopedics;  Laterality: Left;  EXPLORATION OF FLEXOR TENDON LEFT INDEX FINGER       There were no vitals filed for this visit.   Subjective Assessment - 09/30/19 1105    Subjective  Started 14 years ago and became progressively worse.  I have no muscle tone in the pelvic floor. My pelvic floor fatiques. I am unable to stop my urine flow. Sometimes I spontaneously during the day.    Patient Stated Goals  reduce urinary leakage    Currently in Pain?  Yes    Pain Score  1     Pain Location  Pelvis    Pain Orientation  Mid    Pain Descriptors / Indicators  Aching    Pain Type  Chronic pain    Pain Onset  More than a month ago    Pain Frequency  Intermittent    Aggravating Factors   walking when bad pain    Pain Relieving Factors  heat pad    Multiple Pain Sites  No         [facility] PT Assessment - 09/30/19 0001      Assessment   Medical Diagnosis  N39.3 GSI ( genuine stress incontinence) female    Referring Provider (PT)  Dr. Salvadore Dom    Onset Date/Surgical Date  --   14 year ago   Prior Therapy  None      Precautions   Precautions  None      Restrictions   Weight Bearing  Restrictions  No      Balance Screen   Has the patient fallen in the past 6 months  No    Has the patient had a decrease in activity level because of a fear of falling?   No    Is the patient reluctant to leave their home because of a fear of falling?   No      Home Film/video editor residence      Prior Function   Level of Independence  Independent    Vocation  Part time employment    Vocation Requirements  caregiver with bending, housework    Leisure  none      Cognition   Overall Cognitive Status  Within Functional Limits for tasks assessed      Posture/Postural Control   Posture/Postural Control  Postural limitations    Postural Limitations  Rounded Shoulders;Forward head      ROM / Strength   AROM / PROM / Strength  AROM;PROM;Strength      AROM   Lumbar Flexion  full    Lumbar Extension  decreased by 50%    Lumbar - Right Side Bend  full    Lumbar - Left Side Bend  full    Lumbar - Right Rotation  decreased by 25%     Lumbar - Left Rotation  decreased by 25%      Strength   Right Hip  External Rotation   4/5    Right Hip Internal Rotation  4/5    Right Hip ABduction  4/5    Right Hip ADduction  4/5    Left Hip Extension  4/5    Left Hip External Rotation  4/5    Left Hip Internal Rotation  4/5    Left Hip ADduction  4/5      Palpation   SI assessment   left ilium rotated posteriorly    Palpation comment  bilateral iliopsoas, suprapubic area, lumbar paraspinals                Objective measurements completed on examination: See above findings.    Pelvic Floor Special Questions - 09/30/19 0001    Prior Pregnancies  Yes    Number of Pregnancies  2    Number of Vaginal Deliveries  2    Diastasis Recti  none    Urinary Leakage  Yes    Activities that cause leaking  Coughing;Sneezing;Laughing;With strong urge;Lifting    Fecal incontinence  Yes    Skin Integrity  Intact    External Palpation  lef ischiocavernosus    Prolapse  Anterior Wall    Pelvic Floor Internal Exam  Patient confirms identification and approves PT to assess pelvic floor and treatment    Exam Type  Vaginal    Palpation  tenderness located in bil. obturator internist and levator ani    Strength  weak squeeze, no lift    Strength # of reps  3    Strength # of seconds  5                 PT Short Term Goals - 09/30/19 1139      PT SHORT TERM GOAL #1   Title  pt independent with initial HEP    Time  4    Period  Weeks    Status  New    Target Date  10/28/19        PT Long Term Goals - 09/30/19  [place] #1   Title  pt independent with advanced HEP    Baseline  not educated yet    Time  12    Period  Weeks    Status  New    Target Date  12/23/19      PT LONG TERM GOAL #2   Title  urinary leakage reduced >/= 75% with strong urge due to increased pelvic floor strength >/= 4/5 holding for 10 seconds    Baseline  pelvic floor strength 2/5 holding for 5 seconds    Time  12    Period  Weeks    Status  New    Target Date  12/23/19      PT LONG  TERM GOAL #3   Title  cough, sneeze, and laugh with urinary leakage decreased >/= 75% due to improve pelvic floor strength >/= 4/5    Baseline  pelvic floor strength 2/5    Time  12    Period  Weeks    Status  New    Target Date  12/23/19      PT LONG TERM GOAL #4   Title  able to perform daily activities with pelvic pain decreased </= 0-1/10    Baseline  pain level 2/10, trigger points in pelvic floor muscles    Time  12    Period  Weeks    Status  New    Target Date  12/23/19             Plan - 09/30/19 1133    Clinical Impression Statement  Patient is a 48 year old female with stress incontinence for the past 14 years. Patient also has IBS and endometriosis. Patient reports constant pelvic pain at level 1/10 but times it will increase with flare-up. Pelvic floor strength is 2/5 holding for 5 sec 3 times. Tenderness located in bilateral levator ani, obturator internist, left ischiocavernosus, suprapubic area, and bilateral iliopsoas. Patient bilateral hip strength is 4/5 and abdominal strength is 3/5. Patient has decreased movement of lumbar spine with extension decreased by 50% and bilateral rotation is 25% limited. Patient does not wear a pad but will leak urine with strong urge, laughing, coughing, sneezing and lifting. Patient will benefit from skilled therapy to improve pelvic strength and coordination while working on pelvic floor trigger points to reduce pain.    Personal Factors and Comorbidities  Comorbidity 1;Comorbidity 2;Fitness    Comorbidities  IBS, endometriosis    Examination-Activity Limitations  Lift;Continence    Examination-Participation Restrictions  Interpersonal Relationship    Stability/Clinical Decision Making  Evolving/Moderate complexity    Clinical Decision Making  Low    Rehab Potential  Excellent    PT Frequency  1x / week    PT Duration  12 weeks    PT Treatment/Interventions  Biofeedback;Electrical Stimulation;Cryotherapy;Moist  Heat;Ultrasound;Therapeutic exercise;Therapeutic activities;Neuromuscular re-education;Patient/family education;Dry needling;Manual techniques;Spinal Manipulations    PT Next Visit Plan  hip stretches, pelvic floor soft tissue work, lower abdominal strength, hookly with ball squeeze and hip abducton    Consulted and Agree with Plan of Care  Patient       Patient will benefit from skilled therapeutic intervention in order to improve the following deficits and impairments:  Decreased coordination, Increased fascial restricitons, Decreased endurance, Increased muscle spasms, Pain, Decreased strength  Visit Diagnosis: Muscle weakness (generalized) - Plan: PT plan of care cert/re-cert  Unspecified lack of coordination - Plan: PT plan of care cert/re-cert  Stress incontinence (female) (female) - Plan: PT plan of care cert/re-cert     Problem List Patient Active Problem List   Diagnosis Date Noted  . 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 Hip pain 02/14/2016  . Endometriosis 09/27/2013  . TMJ (temporomandibular joint syndrome) 02/10/2013  . ANEMIA-NOS 05/26/2009  . ALLERGIC RHINITIS 05/26/2009  . PALPITATIONS 05/26/2009  . CARDIAC MURMUR 05/26/2009  . DYSPNEA 05/26/2009    Earlie Counts, PT 09/30/19 11:46 AM   [facility] [address] [place], [place], [address] Phone: [phone]   Fax:  [phone]  Name: Patricia Newton MRN: [id number] Date of Birth: 1970/12/26

## 2019-10-12 ENCOUNTER — Ambulatory Visit: Payer: Medicaid Other | Admitting: Physical Therapy

## 2019-10-12 ENCOUNTER — Other Ambulatory Visit: Payer: Self-pay

## 2019-10-12 DIAGNOSIS — Z20822 Contact with and (suspected) exposure to covid-19: Secondary | ICD-10-CM

## 2019-10-14 LAB — NOVEL CORONAVIRUS, NAA: SARS-CoV-2, NAA: NOT DETECTED

## 2019-10-19 ENCOUNTER — Ambulatory Visit: Payer: Medicaid Other | Admitting: Physical Therapy

## 2019-10-19 ENCOUNTER — Encounter: Payer: Self-pay | Admitting: Physical Therapy

## 2019-10-19 ENCOUNTER — Other Ambulatory Visit: Payer: Self-pay

## 2019-10-19 DIAGNOSIS — N393 Stress incontinence (female) (male): Secondary | ICD-10-CM

## 2019-10-19 DIAGNOSIS — M6281 Muscle weakness (generalized): Secondary | ICD-10-CM | POA: Diagnosis not present

## 2019-10-19 DIAGNOSIS — R279 Unspecified lack of coordination: Secondary | ICD-10-CM

## 2019-10-19 NOTE — Patient Instructions (Signed)
Access Code: [email or web address]  URL: [email or web address]  Date: 10/19/2019  Prepared by: Earlie Counts   Exercises Supine Hamstring Stretch - 2 reps - 1 sets - 30 sec hold - 1x daily - 7x weekly Supine Butterfly Groin Stretch - 1 reps - 1 sets - 1 min hold - 1x daily - 7x weekly Supine Figure 4 Piriformis Stretch - 2 reps - 1 sets - 30 sec hold - 1x daily - 7x weekly Supine Piriformis Stretch - 2 reps - 1 sets - 30 sec hold - 1x daily - 7x weekly Sidelying Thoracic and Shoulder Rotation - 2 reps - 1 sets - 30 sec hold - 1x daily - 7x weekly Cat-Camel - 10 reps - 1 sets - 1x daily - 7x weekly Child's Pose Stretch - 1 reps - 1 sets - 30 sec hold - 1x daily - 7x weekly Supine Hip Adduction Isometric with Ball - 10 reps - 1 sets - 5 sec hold - 1x daily - 7x weekly Hooklying Clamshell with Resistance - 10 reps - 1 sets - 1x daily - 7x weekly [facility] [address], [place], [place] [address] Phone # [phone] Fax [phone]

## 2019-10-19 NOTE — Therapy (Signed)
[facility] [address], [place], [place], [address] Phone: [phone]   Fax:  [phone]  Physical Therapy Treatment  Patient Details  Name: Patricia Newton MRN: [id number] Date of Birth: Sep 21, 1971 Referring Provider (PT): Dr. Salvadore Dom   Encounter Date: 10/19/2019  PT End of Session - 10/19/19 0850    Visit Number  2    Date for PT Re-Evaluation  12/23/19    Authorization Type  Medicaid    Authorization Time Period  10/12/2019-11/01/2019    Authorization - Visit Number  2    Authorization - Number of Visits  4    PT Start Time  5638   had to go to the bathroom   PT Stop Time  0929    PT Time Calculation (min)  38 min    Activity Tolerance  Patient tolerated treatment well;No increased pain    Behavior During Therapy  WFL for tasks assessed/performed       Past Medical History:  Diagnosis Date  . Abnormal uterine bleeding   . Allergy   . Anxiety   . Arthritis   . Asthma   . Depression   . Fibroid   . GERD (gastroesophageal reflux disease)    tums prn  . Heart murmur   . PONV (postoperative nausea and vomiting)   . Ruptured disk   . Urinary incontinence     Past Surgical History:  Procedure Laterality Date  . ANOPLASTY    . APPENDECTOMY    . COLPOSCOPY    . DIAGNOSTIC LAPAROSCOPY     x3 for endometriosis  . GYNECOLOGIC CRYOSURGERY    . KNEE ARTHROSCOPY     right-x3  . LAPAROSCOPY  03/26/2012   Procedure: LAPAROSCOPY OPERATIVE;  Surgeon: Lovenia Kim, MD;  Location: [facility];  Service: Gynecology;  Laterality: N/A;  with ablation of endometriosis   . left hand surgery  03/11/12  . MOUTH SURGERY    . PELVIC LAPAROSCOPY    . SHOULDER ARTHROSCOPY  right  . TENDON EXPLORATION  03/11/2012   Procedure: TENDON EXPLORATION;  Surgeon: Wynonia Sours, MD;  Location: [facility];  Service: Orthopedics;  Laterality: Left;  EXPLORATION OF FLEXOR TENDON LEFT INDEX FINGER        There were no vitals filed for this visit.  Subjective Assessment - 10/19/19 0852    Subjective  I feel like I have an ovarian cyst and it hurts. I have an appendectomy scar and when I bend over it feels like something will get torque. I have been having this pain more often. There is a bruise on the side of the appendectomy scar.    Patient Stated Goals  reduce urinary leakage    Currently in Pain?  Yes    Pain Score  5     Pain Location  Pelvis    Pain Orientation  Mid    Pain Descriptors / Indicators  Aching    Pain Type  Chronic pain    Pain Onset  More than a month ago    Pain Frequency  Intermittent    Aggravating Factors   walking when bad pain    Pain Relieving Factors  heat pad    Multiple Pain Sites  Yes    Pain Score  7    Pain Location  Abdomen    Pain Orientation  Right    Pain Descriptors / Indicators  Aching;Tender    Pain Type  Acute pain  Pain Onset  More than a month ago    Pain Frequency  Intermittent    Aggravating Factors   bending foward, pressing on the area    Pain Relieving Factors  not bending over         [facility] PT Assessment - 10/19/19 0001      Observation/Other Assessments   Skin Integrity  right side of appendectomy scar is bruised, tender to touch                   [facility] Adult PT Treatment/Exercise - 10/19/19 0001      Self-Care   Self-Care  Other Self-Care Comments    Other Self-Care Comments   education on using a tampon in the vaginal canal to tighten the muscles for tactile cues      Lumbar Exercises: Stretches   Active Hamstring Stretch  Right;Left;1 rep;30 seconds   supine   Lower Trunk Rotation  2 reps;30 seconds   both sides supine   Quadruped Mid Back Stretch  1 rep;30 seconds    Quadruped Mid Back Stretch Limitations  childs pose    Piriformis Stretch  Right;Left;1 rep;30 seconds   supine   Other Lumbar Stretch Exercise  butterfly stretch      Lumbar Exercises: Supine   Clam  10 reps   pelvic floor  contraction, yellow band   Other Supine Lumbar Exercises  hookly ball squeeze with pelvic floor contraction hold 5 sec 10x      Lumbar Exercises: Quadruped   [place]/Old Horse  10 reps             PT Education - 10/19/19 0928    Education Details  Access Code: [email or web address]    Person(s) Educated  Patient    Methods  Explanation;Demonstration;Verbal cues;Handout    Comprehension  Verbalized understanding;Returned demonstration       PT Short Term Goals - 09/30/19 1139      PT SHORT TERM GOAL #1   Title  pt independent with initial HEP    Time  4    Period  Weeks    Status  New    Target Date  10/28/19        PT Long Term Goals - 09/30/19 1140      PT LONG TERM GOAL #1   Title  pt independent with advanced HEP    Baseline  not educated yet    Time  12    Period  Weeks    Status  New    Target Date  12/23/19      PT LONG TERM GOAL #2   Title  urinary leakage reduced >/= 75% with strong urge due to increased pelvic floor strength >/= 4/5 holding for 10 seconds    Baseline  pelvic floor strength 2/5 holding for 5 seconds    Time  12    Period  Weeks    Status  New    Target Date  12/23/19      PT LONG TERM GOAL #3   Title  cough, sneeze, and laugh with urinary leakage decreased >/= 75% due to improve pelvic floor strength >/= 4/5    Baseline  pelvic floor strength 2/5    Time  12    Period  Weeks    Status  New    Target Date  12/23/19      PT LONG TERM GOAL #4   Title  able to perform daily activities with pelvic pain decreased </=  0-1/10    Baseline  pain level 2/10, trigger points in pelvic floor muscles    Time  12    Period  Weeks    Status  New    Target Date  12/23/19            Plan - 10/19/19 1610    Clinical Impression Statement  Patient has bent over and a pain in the appenctomy scar happened followed by a discolored area. PT suggested to patient to call her doctor to be looked at. No internal work done due to bruise feeling. Patient has  learned hip stretches for flexibility. Patient will be using tampons to give her tactile cues to contract the pelvic floor muscles. Patient just started therapy so she has not met goals. Patient will benefit from skilled therapy to improve pelvic strength and coordination while workon on pelvic floor trigger points to reduce pain.    Personal Factors and Comorbidities  Comorbidity 1;Comorbidity 2;Fitness    Comorbidities  IBS, endometriosis    Examination-Activity Limitations  Lift;Continence    Examination-Participation Restrictions  Interpersonal Relationship    Stability/Clinical Decision Making  Evolving/Moderate complexity    Rehab Potential  Excellent    PT Frequency  1x / week    PT Duration  12 weeks    PT Treatment/Interventions  Biofeedback;Electrical Stimulation;Cryotherapy;Moist Heat;Ultrasound;Therapeutic exercise;Therapeutic activities;Neuromuscular re-education;Patient/family education;Dry needling;Manual techniques;Spinal Manipulations    PT Next Visit Plan  pelvic floor soft tissue work, lower abdominal strength, work on abdominal bracing    PT Home Exercise Plan  Access Code: [email or web address]    Consulted and Agree with Plan of Care  Patient       Patient will benefit from skilled therapeutic intervention in order to improve the following deficits and impairments:  Decreased coordination, Increased fascial restricitons, Decreased endurance, Increased muscle spasms, Pain, Decreased strength  Visit Diagnosis: Muscle weakness (generalized)  Unspecified lack of coordination  Stress incontinence (female) (female)     Problem List Patient Active Problem List   Diagnosis Date Noted  . 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 Hip pain 02/14/2016  . Endometriosis 09/27/2013  . TMJ (temporomandibular joint syndrome) 02/10/2013  . ANEMIA-NOS 05/26/2009  . ALLERGIC RHINITIS 05/26/2009  . PALPITATIONS 05/26/2009  . CARDIAC MURMUR 05/26/2009  .  DYSPNEA 05/26/2009    Earlie Counts, PT 10/19/19 9:33 AM   [facility] [address] [place], [place], [address] Phone: [phone]   Fax:  [phone]  Name: Tiauna Whisnant MRN: [id number] Date of Birth: July 05, 1971

## 2019-10-26 ENCOUNTER — Encounter: Payer: Self-pay | Admitting: Physical Therapy

## 2019-10-26 ENCOUNTER — Ambulatory Visit: Payer: Medicaid Other | Attending: Obstetrics and Gynecology | Admitting: Physical Therapy

## 2019-10-26 ENCOUNTER — Other Ambulatory Visit: Payer: Self-pay

## 2019-10-26 DIAGNOSIS — R279 Unspecified lack of coordination: Secondary | ICD-10-CM | POA: Insufficient documentation

## 2019-10-26 DIAGNOSIS — M6281 Muscle weakness (generalized): Secondary | ICD-10-CM | POA: Insufficient documentation

## 2019-10-26 DIAGNOSIS — N393 Stress incontinence (female) (male): Secondary | ICD-10-CM

## 2019-10-26 NOTE — Therapy (Signed)
[facility] [address] [place], [place], [address] Phone: [phone]   Fax:  [phone]  Physical Therapy Treatment  Patient Details  Name: Patricia Newton MRN: [id number] Date of Birth: 1971/12/05 Referring Provider (PT): Dr. Salvadore Dom   Encounter Date: 10/26/2019  PT End of Session - 10/26/19 0851    Visit Number  3    Date for PT Re-Evaluation  12/23/19    Authorization Type  Medicaid    Authorization Time Period  10/12/2019-11/01/2019    Authorization - Visit Number  3    Authorization - Number of Visits  4    PT Start Time  0845    PT Stop Time  0925    PT Time Calculation (min)  40 min    Activity Tolerance  Patient tolerated treatment well;No increased pain    Behavior During Therapy  WFL for tasks assessed/performed       Past Medical History:  Diagnosis Date  . Abnormal uterine bleeding   . Allergy   . Anxiety   . Arthritis   . Asthma   . Depression   . Fibroid   . GERD (gastroesophageal reflux disease)    tums prn  . Heart murmur   . PONV (postoperative nausea and vomiting)   . Ruptured disk   . Urinary incontinence     Past Surgical History:  Procedure Laterality Date  . ANOPLASTY    . APPENDECTOMY    . COLPOSCOPY    . DIAGNOSTIC LAPAROSCOPY     x3 for endometriosis  . GYNECOLOGIC CRYOSURGERY    . KNEE ARTHROSCOPY     right-x3  . LAPAROSCOPY  03/26/2012   Procedure: LAPAROSCOPY OPERATIVE;  Surgeon: Lovenia Kim, MD;  Location: [facility];  Service: Gynecology;  Laterality: N/A;  with ablation of endometriosis   . left hand surgery  03/11/12  . MOUTH SURGERY    . PELVIC LAPAROSCOPY    . SHOULDER ARTHROSCOPY  right  . TENDON EXPLORATION  03/11/2012   Procedure: TENDON EXPLORATION;  Surgeon: Wynonia Sours, MD;  Location: [facility];  Service: Orthopedics;  Laterality: Left;  EXPLORATION OF FLEXOR TENDON LEFT INDEX FINGER       There were no vitals filed  for this visit.  Subjective Assessment - 10/26/19 0855    Subjective  I saw the doctor about the bruising and had an ultrasound. I am having the pelvic ultrasound this Thursday. The urinary leakage is not good. I am leaking. Patient does not wear a pad at this time due to worrying about the smell. I am not having pain in the abdominal and pelvic today.    Patient Stated Goals  reduce urinary leakage    Currently in Pain?  No/denies    Multiple Pain Sites  No         [facility] PT Assessment - 10/26/19 0001      Assessment   Medical Diagnosis  N39.3 GSI ( genuine stress incontinence) female    Referring Provider (PT)  Dr. Salvadore Dom    Prior Therapy  None      Precautions   Precautions  None      Restrictions   Weight Bearing Restrictions  No      Home Environment   Living Environment  Private residence      Prior Function   Level of Independence  Independent    Vocation  Part time employment    Vocation Requirements  caregiver with bending, housework    Leisure  none      Cognition   Overall Cognitive Status  Within Functional Limits for tasks assessed      Observation/Other Assessments   Skin Integrity  barely any bruise in the right appendectomy scar      Posture/Postural Control   Posture/Postural Control  Postural limitations    Postural Limitations  Rounded Shoulders;Forward head      ROM / Strength   AROM / PROM / Strength  AROM;PROM;Strength      AROM   Lumbar Flexion  full    Lumbar Extension  decreased by 50%    Lumbar - Right Side Bend  full    Lumbar - Left Side Bend  full    Lumbar - Right Rotation  full    Lumbar - Left Rotation  full      Strength   Right Hip Extension  5/5    Right Hip External Rotation   4/5    Right Hip Internal Rotation  4/5    Right Hip ABduction  4/5    Right Hip ADduction  5/5    Left Hip Extension  5/5    Left Hip External Rotation  4/5    Left Hip Internal Rotation  4/5    Left Hip ADduction  5/5       Palpation   SI assessment   pelvis in correct laignment    Palpation comment  tenderness along the right abdomen                Pelvic Floor Special Questions - 10/26/19 0001    Number of Pregnancies  2    Number of Vaginal Deliveries  2    Diastasis Recti  none    Urinary Leakage  Yes    Activities that cause leaking  Coughing;Sneezing;Laughing;With strong urge;Lifting    Fecal incontinence  Yes    External Palpation  no external tenderness    Pelvic Floor Internal Exam  Patient confirms identification and approves PT to assess pelvic floor and treatment    Exam Type  Vaginal    Palpation  tenderness located on left ischiococcygeus, bladder , left OI,     Strength  fair squeeze, definite lift        [facility] Adult PT Treatment/Exercise - 10/26/19 0001      Lumbar Exercises: Supine   Other Supine Lumbar Exercises  supine contract pelvic floor 5 seconds with pulling therapist finger into the vaginal, supine contract pelvic floor breath in lift head then breath out as she is contracting the pelvic floor      Manual Therapy   Manual Therapy  Internal Pelvic Floor    Internal Pelvic Floor  lefte ischiococcygeus referring pain right lower abdominal, left obturator internist referring pain to left suprapubic area,; release on the right side of bladder with one finger gently on the right and the other on the suprapubic area  externally , release of the neck of the bladder internally, stroking of the introitus to improve mobility of the bulbocavernosus             PT Education - 10/26/19 0927    Education Details  massage suprapubically, supine contract pelvic floor and lift head    Person(s) Educated  Patient    Methods  Explanation;Demonstration;Verbal cues;Handout    Comprehension  Returned demonstration;Verbalized understanding       PT Short Term Goals - 10/26/19 [phone]  PT SHORT TERM GOAL #1   Title  pt independent with initial HEP    Time  4    Period  Weeks     Status  On-going    Target Date  10/28/19        PT Long Term Goals - 10/26/19 0907      PT LONG TERM GOAL #1   Title  pt independent with advanced HEP    Baseline  still learning    Time  12    Period  Weeks    Status  On-going      PT LONG TERM GOAL #2   Title  urinary leakage reduced >/= 75% with strong urge due to increased pelvic floor strength >/= 4/5 holding for 10 seconds    Baseline  pelvic floor strength 2/5 holding for 5 seconds    Time  12    Period  Weeks    Status  On-going      PT LONG TERM GOAL #3   Baseline  pelvic floor strength 2/5    Time  12    Period  Weeks      PT LONG TERM GOAL #4   Title  able to perform daily activities with pelvic pain decreased </= 0-1/10    Baseline  pain level 2/10, trigger points in pelvic floor muscles, having an ultrasound    Time  12    Period  Weeks    Status  On-going            Plan - 10/26/19 0942    Clinical Impression Statement  Patient has attended the initial eval and 2 treatment sessions. Patient continues to have urinary leakage with coughing, sneezing, laughing and strong urge but just started therapy. Patient does not wear a pad just changes her clothes. Patient had a sudden bruise on the right lower abdominal area last week and scheduled for a pelvic ultrasound this week. Patient bilateral hip strength averages 4/5. Lumbar extension decreased by 50% fut all other motions have improved to full.  Pelvis in correct alignment now.Pelvic strength has increased to 3/5. Patient has trigger points in the left lObturator internis, left ischiococcygues, right side of bladder, fascial restrictions in the urethra. Patient will benefit from skilled therapy to improve pelvic strength and coordination while working on pelvic floor trigger points to reduce pain.    Personal Factors and Comorbidities  Comorbidity 1;Comorbidity 2;Fitness    Comorbidities  IBS, endometriosis    Examination-Activity Limitations   Lift;Continence    Examination-Participation Restrictions  Interpersonal Relationship    Stability/Clinical Decision Making  Evolving/Moderate complexity    Rehab Potential  Excellent    PT Frequency  1x / week    PT Duration  12 weeks    PT Treatment/Interventions  Biofeedback;Electrical Stimulation;Cryotherapy;Moist Heat;Ultrasound;Therapeutic exercise;Therapeutic activities;Neuromuscular re-education;Patient/family education;Dry needling;Manual techniques;Spinal Manipulations    PT Next Visit Plan  release of the bladder, pubovesical ligaments, internal work, see what [place] says, pelvic floor contraction with cough progression    PT Home Exercise Plan  Access Code: [email or web address]    Recommended Other Services  MD signed intial note on 10/01/2019    Consulted and Agree with Plan of Care  Patient       Patient will benefit from skilled therapeutic intervention in order to improve the following deficits and impairments:  Decreased coordination, Increased fascial restricitons, Decreased endurance, Increased muscle spasms, Pain, Decreased strength  Visit Diagnosis: Muscle weakness (generalized)  Unspecified lack of coordination  Stress incontinence (  female) (female)     Problem List Patient Active Problem List   Diagnosis Date Noted  . 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 Hip pain 02/14/2016  . Endometriosis 09/27/2013  . TMJ (temporomandibular joint syndrome) 02/10/2013  . ANEMIA-NOS 05/26/2009  . ALLERGIC RHINITIS 05/26/2009  . PALPITATIONS 05/26/2009  . CARDIAC MURMUR 05/26/2009  . DYSPNEA 05/26/2009    Earlie Counts, PT 10/26/19 10:28 AM   [facility] [address], [place], [place], [address] Phone: [phone]   Fax:  [phone]  Name: Patricia Newton MRN: [id number] Date of Birth: 1971/07/10

## 2019-10-26 NOTE — Patient Instructions (Addendum)
Cough: Phase 2 (Supine)    Lie flat. Squeeze pelvic floor and hold. Inhale. Lift head and shoulders. Exhale. Relax. Repeat __5_ times. Do _2__ times a day.   Copyright  VHI. All rights reserved.  [facility] [address] [place], [place] [address] Phone # [phone] Fax [phone]

## 2019-11-02 ENCOUNTER — Ambulatory Visit: Payer: Medicaid Other | Admitting: Physical Therapy

## 2019-11-02 ENCOUNTER — Other Ambulatory Visit: Payer: Self-pay

## 2019-11-02 ENCOUNTER — Encounter: Payer: Self-pay | Admitting: Physical Therapy

## 2019-11-02 DIAGNOSIS — M6281 Muscle weakness (generalized): Secondary | ICD-10-CM | POA: Diagnosis not present

## 2019-11-02 DIAGNOSIS — R279 Unspecified lack of coordination: Secondary | ICD-10-CM

## 2019-11-02 DIAGNOSIS — N393 Stress incontinence (female) (male): Secondary | ICD-10-CM

## 2019-11-02 NOTE — Therapy (Signed)
[facility] [address], [place], [place], [address] Phone: [phone]   Fax:  [phone]  Physical Therapy Treatment  Patient Details  Name: Patricia Newton MRN: [id number] Date of Birth: 1971/05/18 Referring Provider (PT): Dr. Salvadore Dom   Encounter Date: 11/02/2019  PT End of Session - 11/02/19 1658    Visit Number  4    Date for PT Re-Evaluation  12/23/19    Authorization Type  Medicaid    Authorization Time Period  11/02/2019-12/20/2019    Authorization - Visit Number  1    Authorization - Number of Visits  7    PT Start Time  [id number]    PT Stop Time  [id number]    PT Time Calculation (min)  40 min    Activity Tolerance  Patient tolerated treatment well;No increased pain    Behavior During Therapy  WFL for tasks assessed/performed       Past Medical History:  Diagnosis Date  . Abnormal uterine bleeding   . Allergy   . Anxiety   . Arthritis   . Asthma   . Depression   . Fibroid   . GERD (gastroesophageal reflux disease)    tums prn  . Heart murmur   . PONV (postoperative nausea and vomiting)   . Ruptured disk   . Urinary incontinence     Past Surgical History:  Procedure Laterality Date  . ANOPLASTY    . APPENDECTOMY    . COLPOSCOPY    . DIAGNOSTIC LAPAROSCOPY     x3 for endometriosis  . GYNECOLOGIC CRYOSURGERY    . KNEE ARTHROSCOPY     right-x3  . LAPAROSCOPY  03/26/2012   Procedure: LAPAROSCOPY OPERATIVE;  Surgeon: Lovenia Kim, MD;  Location: [facility];  Service: Gynecology;  Laterality: N/A;  with ablation of endometriosis   . left hand surgery  03/11/12  . MOUTH SURGERY    . PELVIC LAPAROSCOPY    . SHOULDER ARTHROSCOPY  right  . TENDON EXPLORATION  03/11/2012   Procedure: TENDON EXPLORATION;  Surgeon: Wynonia Sours, MD;  Location: [facility];  Service: Orthopedics;  Laterality: Left;  EXPLORATION OF FLEXOR TENDON LEFT INDEX FINGER       There were no vitals filed  for this visit.  Subjective Assessment - 11/02/19 1620    Subjective  The ultrasound was normal. I am having a colonoscopy and will see a MD this week. The heating pad is helping the achiness. The urinary leakage is better since last visit. The urinary leakage is 50% better since last visit. Sometimes I can get through a day without leakage.    Patient Stated Goals  reduce urinary leakage    Currently in Pain?  Yes    Pain Score  3     Pain Location  Abdomen    Pain Orientation  Lower    Pain Descriptors / Indicators  Aching    Pain Type  Chronic pain    Pain Onset  More than a month ago    Pain Frequency  Intermittent    Aggravating Factors   walking when bad pain    Pain Relieving Factors  heat pad    Multiple Pain Sites  No                       [facility] Adult PT Treatment/Exercise - 11/02/19 0001      Lumbar Exercises: Stretches   Piriformis Stretch  Right;Left;1  rep;30 seconds   sitting     Lumbar Exercises: Supine   Ab Set  10 reps;5 seconds    AB Set Limitations  with tactile cues to lower abdominals    Other Supine Lumbar Exercises  breath into lower rib cage then abdomen to work on breath control and moveing the diaphgram      Manual Therapy   Manual Therapy  Joint mobilization;Soft tissue mobilization;Myofascial release    Joint Mobilization  lower rib cage mobilization with breath to improve mobility    Soft tissue mobilization  to left diaphragm    Myofascial Release  right lower quadrant, left upper quadrant, along the midline of abdomen             PT Education - 11/02/19 1654    Education Details  Access Code: [email or web address]    Person(s) Educated  Patient    Methods  Explanation;Demonstration;Verbal cues;Handout    Comprehension  Verbalized understanding;Returned demonstration       PT Short Term Goals - 11/02/19 1624      PT SHORT TERM GOAL #1   Title  pt independent with initial HEP    Time  4    Period  Weeks    Status  Achieved     Target Date  10/28/19        PT Long Term Goals - 11/02/19 1625      PT LONG TERM GOAL #1   Title  pt independent with advanced HEP    Baseline  still learning    Time  12    Period  Weeks    Status  On-going      PT LONG TERM GOAL #2   Title  urinary leakage reduced >/= 75% with strong urge due to increased pelvic floor strength >/= 4/5 holding for 10 seconds    Baseline  pelvic floor strength 2/5 holding for 5 seconds; leakage is 50% better    Time  12    Period  Weeks    Status  On-going      PT LONG TERM GOAL #3   Title  cough, sneeze, and laugh with urinary leakage decreased >/= 75% due to improve pelvic floor strength >/= 4/5    Baseline  pelvic floor strength 2/5; 50% better with leakage    Time  12    Period  Weeks    Status  On-going            Plan - 11/02/19 1627    Clinical Impression Statement  Patient has tightness in the left diaphragm with tenderness and decreased lower rib cage mobility. Patient had tightness in the right lower quadrant and left upper quadrant with manual work causes some pain. Patient needed tactile cues to engage the abdomen correctly to work on the pelvic floor. Patient reports her urinary leakage is 50% better. Patient will benefit from skilled therapy to improve pelvic strength and coordination while working on pelvic floor trigger points to reduce pain.    Personal Factors and Comorbidities  Comorbidity 1;Comorbidity 2;Fitness    Comorbidities  IBS, endometriosis    Examination-Activity Limitations  Lift;Continence    Examination-Participation Restrictions  Interpersonal Relationship    Stability/Clinical Decision Making  Evolving/Moderate complexity    Rehab Potential  Excellent    PT Frequency  1x / week    PT Duration  12 weeks    PT Treatment/Interventions  Biofeedback;Electrical Stimulation;Cryotherapy;Moist Heat;Ultrasound;Therapeutic exercise;Therapeutic activities;Neuromuscular re-education;Patient/family education;Dry  needling;Manual techniques;Spinal Manipulations    PT Next  Visit Plan  internal work,  pelvic floor contraction with cough progression; work on left diaphgram    PT Home Exercise Plan  Access Code: [email or web address] and Agree with Plan of Care  Patient       Patient will benefit from skilled therapeutic intervention in order to improve the following deficits and impairments:  Decreased coordination, Increased fascial restricitons, Decreased endurance, Increased muscle spasms, Pain, Decreased strength  Visit Diagnosis: Muscle weakness (generalized)  Unspecified lack of coordination  Stress incontinence (female) (female)     Problem List Patient Active Problem List   Diagnosis Date Noted  . 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 Hip pain 02/14/2016  . Endometriosis 09/27/2013  . TMJ (temporomandibular joint syndrome) 02/10/2013  . ANEMIA-NOS 05/26/2009  . ALLERGIC RHINITIS 05/26/2009  . PALPITATIONS 05/26/2009  . CARDIAC MURMUR 05/26/2009  . DYSPNEA 05/26/2009    Earlie Counts, PT 11/02/19 5:02 PM   [facility] [address] [place], [place], [address] Phone: [phone]   Fax:  [phone]  Name: Patricia Newton MRN: [id number] Date of Birth: February 10, 1971

## 2019-11-02 NOTE — Patient Instructions (Signed)
Access Code: [email or web address]  URL: [email or web address]  Date: 11/02/2019  Prepared by: Earlie Counts   Exercises Supine Hamstring Stretch - 2 reps - 1 sets - 30 sec hold - 1x daily - 7x weekly Supine Butterfly Groin Stretch - 1 reps - 1 sets - 1 min hold - 1x daily - 7x weekly Supine Figure 4 Piriformis Stretch - 2 reps - 1 sets - 30 sec hold - 1x daily - 7x weekly Supine Piriformis Stretch - 2 reps - 1 sets - 30 sec hold - 1x daily - 7x weekly Sidelying Thoracic and Shoulder Rotation - 2 reps - 1 sets - 30 sec hold - 1x daily - 7x weekly Cat-Camel - 10 reps - 1 sets - 1x daily - 7x weekly Child's Pose Stretch - 1 reps - 1 sets - 30 sec hold - 1x daily - 7x weekly Supine Hip Adduction Isometric with Ball - 10 reps - 1 sets - 5 sec hold - 1x daily - 7x weekly Hooklying Clamshell with Resistance - 10 reps - 1 sets - 1x daily - 7x weekly Hooklying Transversus Abdominis Palpation - 10 reps - 1 sets - 5 hold - 2x daily - 7x weekly [facility] [address], [place], [place] [address] Phone # [phone] Fax [phone]

## 2019-11-11 ENCOUNTER — Ambulatory Visit: Payer: Medicaid Other | Admitting: Physical Therapy

## 2019-11-25 ENCOUNTER — Ambulatory Visit: Payer: Medicaid Other | Admitting: Physical Therapy

## 2019-12-01 ENCOUNTER — Ambulatory Visit: Payer: Medicaid Other | Attending: Obstetrics and Gynecology | Admitting: Physical Therapy

## 2019-12-01 ENCOUNTER — Other Ambulatory Visit: Payer: Self-pay

## 2019-12-01 ENCOUNTER — Encounter: Payer: Self-pay | Admitting: Physical Therapy

## 2019-12-01 DIAGNOSIS — N393 Stress incontinence (female) (male): Secondary | ICD-10-CM | POA: Diagnosis present

## 2019-12-01 DIAGNOSIS — M6281 Muscle weakness (generalized): Secondary | ICD-10-CM | POA: Diagnosis present

## 2019-12-01 DIAGNOSIS — R279 Unspecified lack of coordination: Secondary | ICD-10-CM

## 2019-12-01 NOTE — Patient Instructions (Signed)
Access Code: [email or web address]  URL: [email or web address]  Date: 12/01/2019  Prepared by: Earlie Counts   Exercises Supine Hamstring Stretch - 2 reps - 1 sets - 30 sec hold - 1x daily - 7x weekly Supine Butterfly Groin Stretch - 1 reps - 1 sets - 1 min hold - 1x daily - 7x weekly Supine Figure 4 Piriformis Stretch - 2 reps - 1 sets - 30 sec hold - 1x daily - 7x weekly Supine Piriformis Stretch - 2 reps - 1 sets - 30 sec hold - 1x daily - 7x weekly Sidelying Thoracic and Shoulder Rotation - 2 reps - 1 sets - 30 sec hold - 1x daily - 7x weekly Cat-Camel - 10 reps - 1 sets - 1x daily - 7x weekly Child's Pose Stretch - 1 reps - 1 sets - 30 sec hold - 1x daily - 7x weekly Supine Hip Adduction Isometric with Ball - 10 reps - 1 sets - 5 sec hold - 1x daily - 7x weekly Hooklying Clamshell with Resistance - 10 reps - 1 sets - 1x daily - 7x weekly Hooklying Transversus Abdominis Palpation - 10 reps - 1 sets - 5 hold - 2x daily - 7x weekly Functional Pelvic Floor Contractions with Cough Simulation - 5 reps - 1 sets - 2x daily - 7x weekly Sit to Stand with Pelvic Floor Contraction - 5 reps - 1 sets - 3x daily - 7x weekly [facility] [address], [place],  [address] Phone # [phone] Fax [phone]

## 2019-12-01 NOTE — Therapy (Addendum)
[facility] [address] [place], [place], [address] Phone: [phone]   Fax:  [phone]  Physical Therapy Treatment  Patient Details  Name: Patricia Newton MRN: [id number] Date of Birth: February 24, 1971 Referring Provider (PT): Dr. Salvadore Dom   Encounter Date: 12/01/2019  PT End of Session - 12/01/19 0848    Visit Number  5    Date for PT Re-Evaluation  12/23/19    Authorization Type  Medicaid    Authorization Time Period  11/02/2019-12/20/2019    Authorization - Visit Number  2    Authorization - Number of Visits  7    PT Start Time  0845    PT Stop Time  0925    PT Time Calculation (min)  40 min    Activity Tolerance  Patient tolerated treatment well;No increased pain    Behavior During Therapy  WFL for tasks assessed/performed       Past Medical History:  Diagnosis Date  . Abnormal uterine bleeding   . Allergy   . Anxiety   . Arthritis   . Asthma   . Depression   . Fibroid   . GERD (gastroesophageal reflux disease)    tums prn  . Heart murmur   . PONV (postoperative nausea and vomiting)   . Ruptured disk   . Urinary incontinence     Past Surgical History:  Procedure Laterality Date  . ANOPLASTY    . APPENDECTOMY    . COLPOSCOPY    . DIAGNOSTIC LAPAROSCOPY     x3 for endometriosis  . GYNECOLOGIC CRYOSURGERY    . KNEE ARTHROSCOPY     right-x3  . LAPAROSCOPY  03/26/2012   Procedure: LAPAROSCOPY OPERATIVE;  Surgeon: Lovenia Kim, MD;  Location: [facility];  Service: Gynecology;  Laterality: N/A;  with ablation of endometriosis   . left hand surgery  03/11/12  . MOUTH SURGERY    . PELVIC LAPAROSCOPY    . SHOULDER ARTHROSCOPY  right  . TENDON EXPLORATION  03/11/2012   Procedure: TENDON EXPLORATION;  Surgeon: Wynonia Sours, MD;  Location: [facility];  Service: Orthopedics;  Laterality: Left;  EXPLORATION OF FLEXOR TENDON LEFT INDEX FINGER       There were no vitals filed  for this visit.  Subjective Assessment - 12/01/19 0849    Subjective  The colonoscopy was fine and found a lipoma inside intestines. I see the doctor about the results in 2 weeks.    Patient Stated Goals  reduce urinary leakage    Currently in Pain?  Yes    Pain Location  Abdomen    Pain Orientation  Lower    Pain Descriptors / Indicators  Aching;Sharp    Pain Type  Chronic pain    Pain Onset  More than a month ago    Pain Frequency  Intermittent    Aggravating Factors   coughing, activity,    Pain Relieving Factors  sit up when have sharp pain will sit up, resting                    Pelvic Floor Special Questions - 12/01/19 0001    Pelvic Floor Internal Exam  Patient confirms identification and approves PT to assess pelvic floor and treatment    Exam Type  Vaginal    Strength  good squeeze, good lift, able to hold agaisnt strong resistance    Strength # of seconds  20  [facility] PT Treatment/Exercise - 12/01/19 0001      Lumbar Exercises: Seated   Sit to Stand  10 reps    Sit to Stand Limitations  with pelvic floor contraction    Other Seated Lumbar Exercises  seated pelvic floor contraction with cough 10x      Lumbar Exercises: Supine   Other Supine Lumbar Exercises  resisted breathing into the left rib cage to improve mobility and improve opening of the rib cage      Manual Therapy   Manual Therapy  Soft tissue mobilization;Internal Pelvic Floor    Joint Mobilization  lower rib cage mobilization with breath to improve mobility    Soft tissue mobilization  to left diaphragm    Internal Pelvic Floor  soft  tissue work to the left levaotr ani, OI, myofascial release to the cervix and one hand on lower abdominal area releasing the fascia and release the left side of the vaginal canal             PT Education - 12/01/19 0927    Education Details  Access Code: [email or web address]    Person(s) Educated  Patient    Methods  Explanation;Demonstration;Verbal  cues;Handout    Comprehension  Verbalized understanding;Returned demonstration       PT Short Term Goals - 11/02/19 1624      PT SHORT TERM GOAL #1   Title  pt independent with initial HEP    Time  4    Period  Weeks    Status  Achieved    Target Date  10/28/19        PT Long Term Goals - 12/01/19 0933      PT LONG TERM GOAL #1   Title  pt independent with advanced HEP    Baseline  still learning    Time  12    Period  Weeks    Status  On-going      PT LONG TERM GOAL #2   Title  urinary leakage reduced >/= 75% with strong urge due to increased pelvic floor strength >/= 4/5 holding for 10 seconds    Baseline  pelvic floor strength 4/5 holding for 5 seconds; leakage is 70% better    Time  12    Period  Weeks    Status  On-going      PT LONG TERM GOAL #3   Title  cough, sneeze, and laugh with urinary leakage decreased >/= 75% due to improve pelvic floor strength >/= 4/5    Baseline  pelvic floor strength 4/5; 50% better with leakage    Time  12    Period  Weeks    Status  On-going      PT LONG TERM GOAL #4   Title  able to perform daily activities with pelvic pain decreased </= 0-1/10    Baseline  pain level 2/10, trigger points in pelvic floor muscles    Time  12    Period  Weeks    Status  On-going            Plan - 12/01/19 0855    Clinical Impression Statement  Patient has 25 % decreased in urinary leakage from last visit. . Patient pelvic floor strength went from 3/5 to 4/5 holding for 20 seconds. Patient sometimes has difficulty with holding pelvic floor contraction in sitting and standing. Patient had fascial releases in the pelvic floor that would refer pain to the left diaphragm and lower abdomin. After myofascial work  there was freer movement of the pelvic floor tissue. Patient will benefit from skilled therapy to improve pelvic strength and coordination while working on pelvic floor trigger points to reduce pain.    Personal Factors and Comorbidities   Comorbidity 1;Comorbidity 2;Fitness    Comorbidities  IBS, endometriosis    Examination-Activity Limitations  Lift;Continence    Examination-Participation Restrictions  Interpersonal Relationship    Stability/Clinical Decision Making  Evolving/Moderate complexity    Rehab Potential  Excellent    PT Frequency  1x / week    PT Duration  12 weeks    PT Treatment/Interventions  Biofeedback;Electrical Stimulation;Cryotherapy;Moist Heat;Ultrasound;Therapeutic exercise;Therapeutic activities;Neuromuscular re-education;Patient/family education;Dry needling;Manual techniques;Spinal Manipulations    PT Next Visit Plan  internal work with releasing the fascial by the cervix  pelvic floor contraction with cough progression and standing    PT Home Exercise Plan  Access Code: [email or web address]    Consulted and Agree with Plan of Care  Patient       Patient will benefit from skilled therapeutic intervention in order to improve the following deficits and impairments:  Decreased coordination, Increased fascial restricitons, Decreased endurance, Increased muscle spasms, Pain, Decreased strength  Visit Diagnosis: Muscle weakness (generalized)  Unspecified lack of coordination  Stress incontinence (female) (female)     Problem List Patient Active Problem List   Diagnosis Date Noted  . 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 Hip pain 02/14/2016  . Endometriosis 09/27/2013  . TMJ (temporomandibular joint syndrome) 02/10/2013  . ANEMIA-NOS 05/26/2009  . ALLERGIC RHINITIS 05/26/2009  . PALPITATIONS 05/26/2009  . CARDIAC MURMUR 05/26/2009  . DYSPNEA 05/26/2009   Earlie Counts, PT 12/01/19 9:34 AM   [facility] [address] [place], [place], [address] Phone: [phone]   Fax:  [phone]  Name: Patricia Newton MRN: [id number] Date of Birth: [birth year]  PHYSICAL THERAPY DISCHARGE SUMMARY  Visits  from Start of Care: 5  Current functional level related to goals / functional outcomes: See above.    Remaining deficits: See above.    Education / Equipment: HEP Plan:                                                    Patient goals were not met. Patient is being discharged due to not returning since the last visit.  Thank you for the referral. Earlie Counts, PT 01/17/20 7:54 AM  ?????

## 2019-12-03 ENCOUNTER — Encounter: Payer: Self-pay | Admitting: Obstetrics and Gynecology

## 2019-12-03 ENCOUNTER — Telehealth: Payer: Self-pay | Admitting: Obstetrics and Gynecology

## 2019-12-03 DIAGNOSIS — N83202 Unspecified ovarian cyst, left side: Secondary | ICD-10-CM

## 2019-12-03 DIAGNOSIS — N852 Hypertrophy of uterus: Secondary | ICD-10-CM

## 2019-12-03 NOTE — Telephone Encounter (Signed)
Patient is calling regarding CT results from pelvic pain.

## 2019-12-03 NOTE — Telephone Encounter (Signed)
See telephone encounter dated 12/03/19.   Encounter closed.

## 2019-12-03 NOTE — Telephone Encounter (Signed)
Spoke with patient. Patient had a CT of abd/pelvis on 11/15/19 at [facility], ordered by GI. Copy of results attached to [facility]. Patient reports longer and heavier menses for the past few months. Patient is asking Dr. Talbert Nan to review CT results and advise on f/u. Advised patient I will print results for Dr. Talbert Nan to review, our office will f/u with recommendations. Patient is agreeable.   Reviewed CT report.   Last AEX 08/25/19 Copy of results to Dr. Talbert Nan to review and advise.

## 2019-12-03 NOTE — Telephone Encounter (Signed)
Call returned to patient, left detailed message per patient request. Advised as seen below per Dr. Talbert Nan, advised order has been placed for PUS. Return call to office to schedule.

## 2019-12-03 NOTE — Telephone Encounter (Signed)
Please let the patient know that I have reviewed the CT report, I don't have access to the images. The describe an enlarged appearing uterine fundus concerning for leiomyoma, and a 2.9 cm left ovarian cyst. Please set her up for an ultrasound so we can better evaluate, but please let her know that the CT findings don't sound very concerning.

## 2019-12-03 NOTE — Telephone Encounter (Signed)
Patient returning call to [facility]. Patient stated that a detailed message can be left if she does not answer the phone.

## 2019-12-03 NOTE — Telephone Encounter (Signed)
Left message to call Jaclyn Andy, RN at [facility] [phone].   

## 2019-12-06 NOTE — Telephone Encounter (Signed)
Spoke with patient, PUS scheduled for 12/14/19 at 4pm, consult to follow at 4:30pm. Patient declined earlier appt for 12/15. Order previously placed for precert. Patient verbalizes understanding and is agreeable. [id number] precautions reviewed.   Routing to provider for final review. Patient is agreeable to disposition. Will close encounter.   Cc: Lerry Liner, Magdalene Patricia

## 2019-12-08 ENCOUNTER — Ambulatory Visit: Payer: Medicaid Other | Admitting: Physical Therapy

## 2019-12-13 NOTE — Progress Notes (Signed)
GYNECOLOGY  VISIT   HPI: 48 y.o.   Legally Separated White or Caucasian Not Hispanic or Latino  female   [phone] with Patient's last menstrual period was 12/07/2019 (exact date).   here for consult after PUS for f/u from CT with left ovarian cyst and enlarged fundus concerning for fibroid.  She is on micronor for AUB. Cycles have been worse in the last 3 months. She is a smoker and can't be on OCP's.  Cycles 10/6 x 12 days Cycle 11/3 x 11-12 days, can stop for a few days, then spots Cycle 11/27, painful with clots for 11-12 days Cycle 12/15 just stopped bleeding yesterday.  Cramps are worse prior to her cycle  Sexually active, same partner x 3 years. Some deep dyspareunia. She is doing pelvic floor PT for incontinence, helping.   She has had 5 abdominal surgeries, wondering if she has adhesions. She has aching in her pelvis/lower abdomen. Can last for a few days or all month.  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 She has a f/u with GI next week.  She has IBS-D. Better in the last month.    GYNECOLOGIC HISTORY: Patient's last menstrual period was 12/07/2019 (exact date). Contraception: POP Menopausal hormone therapy: None        OB History    Gravida  3   Para  2   Term  2   Preterm      AB  1   Living  2     SAB  1   TAB      Ectopic      Multiple      Live Births  2              Patient Active Problem List   Diagnosis Date Noted  . 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 Hip pain 02/14/2016  . Endometriosis 09/27/2013  . TMJ (temporomandibular joint syndrome) 02/10/2013  . ANEMIA-NOS 05/26/2009  . ALLERGIC RHINITIS 05/26/2009  . PALPITATIONS 05/26/2009  . CARDIAC MURMUR 05/26/2009  . DYSPNEA 05/26/2009    Past Medical History:  Diagnosis Date  . Abnormal uterine bleeding   . Allergy   . Anxiety   . Arthritis   . Asthma   . Depression   . Fibroid   . GERD  (gastroesophageal reflux disease)    tums prn  . Heart murmur   . PONV (postoperative nausea and vomiting)   . Ruptured disk   . Urinary incontinence     Past Surgical History:  Procedure Laterality Date  . ANOPLASTY    . APPENDECTOMY    . COLPOSCOPY    . DIAGNOSTIC LAPAROSCOPY     x3 for endometriosis  . GYNECOLOGIC CRYOSURGERY    . KNEE ARTHROSCOPY     right-x3  . LAPAROSCOPY  03/26/2012   Procedure: LAPAROSCOPY OPERATIVE;  Surgeon: Lovenia Kim, MD;  Location: [facility];  Service: Gynecology;  Laterality: N/A;  with ablation of endometriosis   . left hand surgery  03/11/12  . MOUTH SURGERY    . PELVIC LAPAROSCOPY    . SHOULDER ARTHROSCOPY  right  . TENDON EXPLORATION  03/11/2012   Procedure: TENDON EXPLORATION;  Surgeon: Wynonia Sours, MD;  Location: [facility];  Service: Orthopedics;  Laterality: Left;  EXPLORATION OF FLEXOR TENDON LEFT INDEX FINGER     4 laparoscopies for endometriosis. Last in 4/13 with treatment of endometriosis.  Current Outpatient Medications  Medication Sig Dispense Refill  . buPROPion (WELLBUTRIN) 75 MG tablet Take 75 mg by mouth daily.     . clonazePAM (KLONOPIN) 1 MG tablet Take 1 mg by mouth at bedtime. Take 1/2 tablet at bedtime.    . diclofenac Sodium (VOLTAREN) 1 % GEL Apply topically.    . dicyclomine (BENTYL) 20 MG tablet Take 20 mg by mouth every 6 (six) hours.    . gabapentin (NEURONTIN) 300 MG capsule TAKE 1 CAPSULE BY MOUTH IN THE MORNING,1 CAP IN THE AFTERNOON, AND 2 CAPSULES AT NIGHT AS NEEDED 120 capsule 3  . ibuprofen (ADVIL,MOTRIN) 200 MG tablet Take 200 mg by mouth every 6 (six) hours as needed.    . norethindrone (MICRONOR) 0.35 MG tablet Take 1 tablet (0.35 mg total) by mouth daily. 3 Package 3  . ondansetron (ZOFRAN) 4 MG tablet Take 4 mg by mouth 3 (three) times daily as needed.    . sucralfate (CARAFATE) 1 g tablet Take 1 g by mouth as needed.    . traMADol (ULTRAM) 50 MG tablet Take 1-2 tablets (50-100 mg total)  by mouth every 6 (six) hours as needed. 120 tablet 1  . traZODone (DESYREL) 50 MG tablet Take 50-100 mg by mouth at bedtime.     No current facility-administered medications for this visit.     ALLERGIES: Erythromycin and Vancomycin cross reactors  Family History  Problem Relation Age of Onset  . Heart disease Mother   . Diabetes Mother   . Atrial fibrillation Mother   . Diabetes Father   . Hyperlipidemia Father   . Asthma Sister   . Heart disease Daughter   . Heart disease Brother   . Stroke Maternal Grandmother   . Dementia Paternal Grandmother     Social History   Socioeconomic History  . Marital status: Legally Separated    Spouse name: Not on file  . Number of children: Not on file  . Years of education: Not on file  . Highest education level: Not on file  Occupational History  . Not on file  Tobacco Use  . Smoking status: Current Every Day Smoker    Packs/day: 1.00    Years: 15.00    Pack years: 15.00    Types: Cigarettes  . Smokeless tobacco: Never Used  Substance and Sexual Activity  . Alcohol use: Yes    Alcohol/week: 2.0 - 3.0 standard drinks    Types: 2 - 3 Cans of beer per week  . Drug use: No  . Sexual activity: Yes    Partners: Male    Birth control/protection: Pill  Other Topics Concern  . Not on file  Social History Narrative  . Not on file   Social Determinants of Health   Financial Resource Strain:   . Difficulty of Paying Living Expenses: Not on file  Food Insecurity:   . Worried About Charity fundraiser in the Last Year: Not on file  . Ran Out of Food in the Last Year: Not on file  Transportation Needs:   . Lack of Transportation (Medical): Not on file  . Lack of Transportation (Non-Medical): Not on file  Physical Activity:   . Days of Exercise per Week: Not on file  . Minutes of Exercise per Session: Not on file  Stress:   . Feeling of Stress : Not on file  Social Connections:   . Frequency of Communication with Friends and  Family: Not on file  . Frequency of  Social Gatherings with Friends and Family: Not on file  . Attends Religious Services: Not on file  . Active Member of Clubs or Organizations: Not on file  . Attends Archivist Meetings: Not on file  . Marital Status: Not on file  Intimate Partner Violence:   . Fear of Current or Ex-Partner: Not on file  . Emotionally Abused: Not on file  . Physically Abused: Not on file  . Sexually Abused: Not on file    Review of Systems  Constitutional: Negative.   HENT: Negative.   Eyes: Negative.   Respiratory: Negative.   Cardiovascular: Negative.   Gastrointestinal: Negative.   Genitourinary: Negative.   Musculoskeletal: Negative.   Skin: Negative.   Neurological: Negative.   Endo/Heme/Allergies: Negative.   Psychiatric/Behavioral: Negative.     PHYSICAL EXAMINATION:    BP 114/72 (BP Location: Right Arm, Patient Position: Sitting, Cuff Size: Large)   Pulse 80   Temp 98.4 F (36.9 C) (Skin)   Wt 165 lb (74.8 kg)   LMP 12/07/2019 (Exact Date)   BMI 24.19 kg/m     General appearance: alert, cooperative and appears stated age  ASSESSMENT H/O AUB, on micronor, now with prolonged bleeding. Ultrasound with suspected adenomyosis.  Ovarian cyst on CT, c/w follicle  Abdominal pelvic cramping, h/o IBS-D, seeing GI Normal pelvic exam at her annual exam in 9/20. No pain today, some tenderness after the ultrasound     PLAN Discussed the option of depo-provera, mirena IUD and TLH/BS Given likely adenomyosis and cramping endometrial ablation is not as likely to be successful  Discussed TLH and recovery She will consider and let me know.     An After Visit Summary was printed and given to the patient.  ~30  minutes face to face time of which over 50% was spent in counseling.

## 2019-12-14 ENCOUNTER — Encounter: Payer: Self-pay | Admitting: Obstetrics and Gynecology

## 2019-12-14 ENCOUNTER — Ambulatory Visit (INDEPENDENT_AMBULATORY_CARE_PROVIDER_SITE_OTHER): Payer: Medicaid Other

## 2019-12-14 ENCOUNTER — Other Ambulatory Visit: Payer: Self-pay

## 2019-12-14 ENCOUNTER — Ambulatory Visit: Payer: Medicaid Other | Admitting: Obstetrics and Gynecology

## 2019-12-14 ENCOUNTER — Telehealth: Payer: Self-pay | Admitting: Physical Therapy

## 2019-12-14 ENCOUNTER — Ambulatory Visit: Payer: Medicaid Other | Admitting: Physical Therapy

## 2019-12-14 VITALS — BP 114/72 | HR 80 | Temp 98.4°F | Wt 165.0 lb

## 2019-12-14 DIAGNOSIS — R109 Unspecified abdominal pain: Secondary | ICD-10-CM | POA: Diagnosis not present

## 2019-12-14 DIAGNOSIS — N921 Excessive and frequent menstruation with irregular cycle: Secondary | ICD-10-CM

## 2019-12-14 DIAGNOSIS — N83202 Unspecified ovarian cyst, left side: Secondary | ICD-10-CM

## 2019-12-14 DIAGNOSIS — N852 Hypertrophy of uterus: Secondary | ICD-10-CM | POA: Diagnosis not present

## 2019-12-14 DIAGNOSIS — Z8742 Personal history of other diseases of the female genital tract: Secondary | ICD-10-CM | POA: Diagnosis not present

## 2019-12-14 DIAGNOSIS — N8003 Adenomyosis of the uterus: Secondary | ICD-10-CM

## 2019-12-14 DIAGNOSIS — N800 Endometriosis of uterus: Secondary | ICD-10-CM

## 2019-12-14 DIAGNOSIS — R102 Pelvic and perineal pain: Secondary | ICD-10-CM

## 2019-12-14 NOTE — Telephone Encounter (Signed)
Called patient about her no show to today's appointment at 8:45 AM.  Left a message.  Earlie Counts, PT @12 /22/2020@ 9:00 AM

## 2019-12-31 ENCOUNTER — Telehealth: Payer: Self-pay | Admitting: Physical Therapy

## 2019-12-31 NOTE — Telephone Encounter (Signed)
Called patient to see if she is returning to therapy.  Earlie Counts, PT @1 /07/2020@ 9:31 AM

## 2020-01-16 ENCOUNTER — Other Ambulatory Visit: Payer: Self-pay | Admitting: Obstetrics and Gynecology

## 2020-01-17 MED ORDER — GABAPENTIN 300 MG PO CAPS: ORAL_CAPSULE | ORAL | 2 refills | Status: DC

## 2020-01-17 NOTE — Telephone Encounter (Signed)
This script was sent to her pharmacy on 08/25/19 for the year. Please inform the pharmacy, why are we getting this request?

## 2020-01-17 NOTE — Telephone Encounter (Signed)
Medication refill request: Gabapentin 300mg  #120, R3 Last AEX:  08-25-19 Next AEX: None Last MMG (if hormonal medication request): 07-15-19 [id number]  Refill authorized: Refill if appropriate

## 2020-01-17 NOTE — Telephone Encounter (Signed)
Per review of gabapentin Rx, patient is taking a total of 4 capsules daily. Rx sent on 08/25/19 was for 30 days supply (120 caps) with 3 RF.   Ok to send refills until next AEX?

## 2020-01-17 NOTE — Telephone Encounter (Signed)
Rx sent for gabapentin 300 mg caps, #360/2RF.   Call to patient, left detailed message, ok per Dpr. Advised refill request has been completed, f/u with pharmacy for filling. Return call to office if any additional questions.   Encounter closed.

## 2020-01-17 NOTE — Telephone Encounter (Signed)
My error, please send in a 3 month supply with 2 refills. Thank you!

## 2020-04-21 ENCOUNTER — Telehealth: Payer: Self-pay | Admitting: Obstetrics and Gynecology

## 2020-04-21 ENCOUNTER — Encounter: Payer: Self-pay | Admitting: Obstetrics & Gynecology

## 2020-04-21 ENCOUNTER — Other Ambulatory Visit: Payer: Self-pay

## 2020-04-21 ENCOUNTER — Ambulatory Visit (INDEPENDENT_AMBULATORY_CARE_PROVIDER_SITE_OTHER): Payer: Medicaid Other | Admitting: Obstetrics & Gynecology

## 2020-04-21 ENCOUNTER — Other Ambulatory Visit: Payer: Self-pay | Admitting: Obstetrics & Gynecology

## 2020-04-21 VITALS — BP 122/80 | HR 72 | Temp 97.9°F | Resp 16 | Wt 160.0 lb

## 2020-04-21 DIAGNOSIS — R350 Frequency of micturition: Secondary | ICD-10-CM

## 2020-04-21 DIAGNOSIS — R300 Dysuria: Secondary | ICD-10-CM | POA: Diagnosis not present

## 2020-04-21 DIAGNOSIS — N898 Other specified noninflammatory disorders of vagina: Secondary | ICD-10-CM

## 2020-04-21 LAB — POCT URINALYSIS DIPSTICK
Bilirubin, UA: NEGATIVE
Blood, UA: NEGATIVE
Glucose, UA: NEGATIVE
Ketones, UA: NEGATIVE
Leukocytes, UA: NEGATIVE
Nitrite, UA: NEGATIVE
Protein, UA: NEGATIVE
Urobilinogen, UA: NEGATIVE E.U./dL — AB
pH, UA: 5 (ref 5.0–8.0)

## 2020-04-21 MED ORDER — NITROFURANTOIN MONOHYD MACRO 100 MG PO CAPS
100.0000 mg | ORAL_CAPSULE | Freq: Two times a day (BID) | ORAL | 0 refills | Status: DC
Start: 2020-04-21 — End: 2020-09-27

## 2020-04-21 MED ORDER — TERCONAZOLE 0.4 % VA CREA
1.0000 | TOPICAL_CREAM | Freq: Every day | VAGINAL | 0 refills | Status: DC
Start: 2020-04-21 — End: 2020-09-27

## 2020-04-21 MED ORDER — FLUCONAZOLE 150 MG PO TABS
150.0000 mg | ORAL_TABLET | Freq: Once | ORAL | 0 refills | Status: AC
Start: 2020-04-21 — End: 2020-04-21

## 2020-04-21 MED ORDER — PHENAZOPYRIDINE HCL 100 MG PO TABS
100.0000 mg | ORAL_TABLET | Freq: Three times a day (TID) | ORAL | 0 refills | Status: DC | PRN
Start: 2020-04-21 — End: 2020-09-27

## 2020-04-21 NOTE — Telephone Encounter (Signed)
Patient is cramping and swollen with yeast infection symptoms.

## 2020-04-21 NOTE — Telephone Encounter (Signed)
Spoke with patient. Patient reports symptoms of bladder spasms, urinary frequency, itching, white dry vaginal d/c, fatigue,  throbbing and aching in the vagina, swollen labia. Symptom started on 4/28. Patient states she had a RX for Omnicef, took one dose in the morning and again in the evening on 4/28. Patient reports she got RX macrodantin from a friend, took 3 doses on 4/29 and again this morning. Urinary frequency has improved. Denies dysuria, hematuria, flank pain, fever/chills. Applied vagisil powder last night. Has not tried monistat, states this does not work for her. Patient is requesting an OV today.   OV scheduled for today at 1:15pm with Dr. Sabra Heck. Covid 19 prescreen negative. Patient ended call before RN could review Covid 19 precautions.   Routing to provider for final review. Patient is agreeable to disposition. Will close encounter.  Cc: Dr. Talbert Nan

## 2020-04-21 NOTE — Progress Notes (Signed)
GYNECOLOGY  VISIT  CC:   Dysuria, vulvar itching/swelling  HPI: 49 y.o. [id number] Legally Separated White or Caucasian female here for on Wednesday morning with what felt like a UTI.  She had an antibiotic on hand, Augmentin.  She took this Wednesday morning and in the evening.  Then she used a friend's macrobid 100mg .  She took 3 yesterday and then one today.  The pain with voiding was improved.  She is not sure if the antibiotic is expired.  Today, she's having some vulvar irritation and swelling as well.  Denies back pain or fever.  Denies flank pain as well.  GYNECOLOGIC HISTORY: No LMP recorded. Patient is perimenopausal. Contraception: micronor Menopausal hormone therapy: n/a  Patient Active Problem List   Diagnosis Date Noted  . 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 Hip pain 02/14/2016  . Endometriosis 09/27/2013  . TMJ (temporomandibular joint syndrome) 02/10/2013  . ANEMIA-NOS 05/26/2009  . ALLERGIC RHINITIS 05/26/2009  . PALPITATIONS 05/26/2009  . CARDIAC MURMUR 05/26/2009  . DYSPNEA 05/26/2009    Past Medical History:  Diagnosis Date  . Abnormal uterine bleeding   . Allergy   . Anxiety   . Arthritis   . Asthma   . Depression   . Fibroid   . GERD (gastroesophageal reflux disease)    tums prn  . H/O degenerative disc disease    neck  . Heart murmur   . PONV (postoperative nausea and vomiting)   . Rotator cuff tear    right side  . Ruptured disk   . Urinary incontinence     Past Surgical History:  Procedure Laterality Date  . ANOPLASTY    . APPENDECTOMY    . COLPOSCOPY    . DIAGNOSTIC LAPAROSCOPY     x3 for endometriosis  . GYNECOLOGIC CRYOSURGERY    . KNEE ARTHROSCOPY     right-x3  . LAPAROSCOPY  03/26/2012   Procedure: LAPAROSCOPY OPERATIVE;  Surgeon: Lovenia Kim, MD;  Location: [facility];  Service: Gynecology;  Laterality: N/A;  with ablation of endometriosis   . left hand surgery  03/11/12  . MOUTH SURGERY     . PELVIC LAPAROSCOPY    . SHOULDER ARTHROSCOPY  right  . TENDON EXPLORATION  03/11/2012   Procedure: TENDON EXPLORATION;  Surgeon: Wynonia Sours, MD;  Location: [facility];  Service: Orthopedics;  Laterality: Left;  EXPLORATION OF FLEXOR TENDON LEFT INDEX FINGER       MEDS:   Current Outpatient Medications on File Prior to Visit  Medication Sig Dispense Refill  . buPROPion (WELLBUTRIN XL) 150 MG 24 hr tablet Take 150 mg by mouth daily.    . clonazePAM (KLONOPIN) 1 MG tablet Take 1 mg by mouth at bedtime. Take 1/2 tablet at bedtime.    . diclofenac Sodium (VOLTAREN) 1 % GEL Apply topically.    . dicyclomine (BENTYL) 20 MG tablet Take 20 mg by mouth every 6 (six) hours.    Marland Kitchen ibuprofen (ADVIL,MOTRIN) 200 MG tablet Take 200 mg by mouth every 6 (six) hours as needed.    . norethindrone (MICRONOR) 0.35 MG tablet Take 1 tablet (0.35 mg total) by mouth daily. 3 Package 3  . ondansetron (ZOFRAN) 4 MG tablet Take 4 mg by mouth 3 (three) times daily as needed.    . pregabalin (LYRICA) 75 MG capsule Take 75 mg by mouth 2 (two) times daily.    . traMADol (ULTRAM) 50 MG tablet Take 1-2 tablets (  50-100 mg total) by mouth every 6 (six) hours as needed. 120 tablet 1  . traZODone (DESYREL) 50 MG tablet Take 50-100 mg by mouth at bedtime.     No current facility-administered medications on file prior to visit.    ALLERGIES: Erythromycin and Vancomycin cross reactors  Family History  Problem Relation Age of Onset  . Heart disease Mother   . Diabetes Mother   . Atrial fibrillation Mother   . Diabetes Father   . Hyperlipidemia Father   . Asthma Sister   . Heart disease Daughter   . Heart disease Brother   . Stroke Maternal Grandmother   . Dementia Paternal Grandmother     SH:  Separated, smoker  Review of Systems  Constitutional: Negative.   HENT: Negative.   Eyes: Negative.   Respiratory: Negative.   Cardiovascular: Negative.   Gastrointestinal: Negative.   Endocrine:  Negative.   Genitourinary: Positive for frequency.       Vaginal itching & irritation, swollen, white discharge  Musculoskeletal: Negative.   Skin: Negative.   Allergic/Immunologic: Negative.   Neurological: Negative.   Psychiatric/Behavioral: Negative.     PHYSICAL EXAMINATION:    BP 122/80   Pulse 72   Temp 97.9 F (36.6 C) (Skin)   Resp 16   Wt 160 lb (72.6 kg)   BMI 23.46 kg/m     General appearance: alert, cooperative and appears stated age Flank:  No CVA tenderness Abdomen: soft, non-tender; bowel sounds normal; no masses,  no organomegaly Lymph:  no inguinal LAD noted  Pelvic: External genitalia: significant erythema and swelling that is non localized, whitish discharge noted              Urethra:  normal appearing urethra with no masses, tenderness or lesions              Bartholins and Skenes: normal                 Vagina: erythema with whitish discharge              Cervix: no lesions              Bimanual Exam:  Uterus:  normal size, contour, position, consistency, mobility, non-tender              Adnexa: no mass, fullness, tenderness  Chaperone, Terence Lux, CMA, was present for exam.  Assessment: Dysuria with partial antibiotic treatment with medication from friend Vulvar erythema and whitish d/c c/w yeast vaginitis Bladder spasms  Plan: Urine culture pending Macrobid 100mg  bid x 5 days  Affirm pending Diflucan and terazol rx to pharmacy Pyridium 100mg  tid prn bladder spasm.  #15/0RF.

## 2020-04-22 LAB — VAGINITIS/VAGINOSIS, DNA PROBE
Candida Species: POSITIVE — AB
Gardnerella vaginalis: NEGATIVE
Trichomonas vaginosis: NEGATIVE

## 2020-04-23 LAB — URINE CULTURE: Organism ID, Bacteria: NO GROWTH

## 2020-06-17 ENCOUNTER — Ambulatory Visit: Payer: Medicaid Other | Attending: Internal Medicine

## 2020-06-17 DIAGNOSIS — Z23 Encounter for immunization: Secondary | ICD-10-CM

## 2020-06-17 NOTE — Progress Notes (Signed)
   Covid-19 Vaccination Clinic  Name:  Delonna Ney    MRN: [id number] DOB: 1971-06-18  06/17/2020  Ms. Hertzberg was observed post Covid-19 immunization for 15 minutes without incident. She was provided with Vaccine Information Sheet and instruction to access the V-Safe system.   Ms. Screws was instructed to call 911 with any severe reactions post vaccine: Marland Kitchen Difficulty breathing  . Swelling of face and throat  . A fast heartbeat  . A bad rash all over body  . Dizziness and weakness   Immunizations Administered    Name Date Dose VIS Date Route   Pfizer COVID-19 Vaccine 06/17/2020 11:58 AM 0.3 mL 02/16/2019 Intramuscular   Manufacturer: [place]   Lot: [id number]   [place]: [id number]

## 2020-07-08 ENCOUNTER — Ambulatory Visit: Payer: Medicaid Other | Attending: Internal Medicine

## 2020-07-10 ENCOUNTER — Ambulatory Visit: Payer: Medicaid Other | Attending: Internal Medicine

## 2020-07-10 DIAGNOSIS — Z23 Encounter for immunization: Secondary | ICD-10-CM

## 2020-07-10 NOTE — Progress Notes (Signed)
   Covid-19 Vaccination Clinic  Name:  Patricia Newton    MRN: [id number] DOB: 1971/02/18  07/10/2020  Patricia Newton was observed post Covid-19 immunization for 15 minutes without incident. She was provided with Vaccine Information Sheet and instruction to access the V-Safe system.   Patricia Newton was instructed to call 911 with any severe reactions post vaccine: Marland Kitchen Difficulty breathing  . Swelling of face and throat  . A fast heartbeat  . A bad rash all over body  . Dizziness and weakness   Immunizations Administered    Name Date Dose VIS Date Route   Pfizer COVID-19 Vaccine 07/10/2020  4:10 PM 0.3 mL 02/16/2019 Intramuscular   Manufacturer: [place]   Lot: [id number]   [place]: [id number]

## 2020-07-19 ENCOUNTER — Telehealth: Payer: Self-pay

## 2020-07-19 NOTE — Telephone Encounter (Signed)
Spoke with pt. Pt states calling to give update to Dr Talbert Nan about smoking, BTB and possible hormone labs.  Pt states having vasomotor sx of mood swings, night sweats, hot flashes vaginal dyness and decreased libido for the last couple of months. Pt states still having irregular cycles while taking Micronor as prescribed. Denies any heavy bleeding until the last couple days of menses. Pt states having mostly spotting or light bleeding with cycles. Changing pad/tampon every 3-4 hours.  Last cycles were 6/23-7/6 and then again on 7/12-7/14. Pt states not currently bleeding and denies feeling weak, lightheaded or dizzy during the cycle bleeding. Pt states she has decided not to have IUD or hysterectomy at this time. Pt states has quit smoking as of 7/24 and has motivation to keep cessation going to be able to go on OCPs to help control AUB.   Pt advised to have an OV for discussion with Dr Talbert Nan for plan of care for possible HRT/hormone labs and when/if to start OCPs. Pt agreeable and verbalized understanding.  Pt scheduled with Dr Talbert Nan on 08/01/20. Pt agreeable to date and time of appt. Advised will give update to Dr Talbert Nan and return call if any additional recommendations/advice.   Routing to Dr Talbert Nan for review.  Encounter closed.

## 2020-07-19 NOTE — Telephone Encounter (Signed)
Patient is calling in regards to discuss HRT.

## 2020-08-01 ENCOUNTER — Telehealth: Payer: Self-pay

## 2020-08-01 ENCOUNTER — Ambulatory Visit: Payer: Self-pay | Admitting: Obstetrics and Gynecology

## 2020-08-01 ENCOUNTER — Encounter: Payer: Self-pay | Admitting: Obstetrics and Gynecology

## 2020-08-01 NOTE — Telephone Encounter (Signed)
Per review of Epic, OV for HRT consult/POP update.    Encounter closed.

## 2020-08-01 NOTE — Telephone Encounter (Signed)
Patient did not keep office visit for today.  °

## 2020-08-01 NOTE — Telephone Encounter (Signed)
Patient called to reschedule missed appointment. Patient rescheduled for (09/27/20). Patient stated she missed appointment due to oversleeping.

## 2020-08-07 ENCOUNTER — Other Ambulatory Visit: Payer: Self-pay | Admitting: Obstetrics and Gynecology

## 2020-08-07 MED ORDER — NORETHINDRONE 0.35 MG PO TABS: 1.0000 | ORAL_TABLET | Freq: Every day | ORAL | 0 refills | Status: DC

## 2020-08-07 NOTE — Telephone Encounter (Signed)
Medication refill request: Norethindrone Last AEX:  08/25/19 JJ Next AEX: 09/27/20 Last MMG (if hormonal medication request): 07/15/19 BIRADS 1 negative/density b Refill authorized: Today, please advise

## 2020-08-07 NOTE — Telephone Encounter (Signed)
Patient asked for refills of norethindrone to [facility], [place].

## 2020-09-27 ENCOUNTER — Encounter: Payer: Self-pay | Admitting: Obstetrics and Gynecology

## 2020-09-27 ENCOUNTER — Other Ambulatory Visit: Payer: Self-pay

## 2020-09-27 ENCOUNTER — Ambulatory Visit (INDEPENDENT_AMBULATORY_CARE_PROVIDER_SITE_OTHER): Payer: Medicaid Other | Admitting: Obstetrics and Gynecology

## 2020-09-27 VITALS — BP 130/74 | HR 89 | Resp 14 | Ht 69.0 in | Wt 165.5 lb

## 2020-09-27 DIAGNOSIS — Z3009 Encounter for other general counseling and advice on contraception: Secondary | ICD-10-CM

## 2020-09-27 DIAGNOSIS — R6882 Decreased libido: Secondary | ICD-10-CM

## 2020-09-27 DIAGNOSIS — N951 Menopausal and female climacteric states: Secondary | ICD-10-CM | POA: Diagnosis not present

## 2020-09-27 DIAGNOSIS — N921 Excessive and frequent menstruation with irregular cycle: Secondary | ICD-10-CM

## 2020-09-27 DIAGNOSIS — R4586 Emotional lability: Secondary | ICD-10-CM

## 2020-09-27 MED ORDER — NORETHIN ACE-ETH ESTRAD-FE 1-20 MG-MCG PO TABS: 1.0000 | ORAL_TABLET | Freq: Every day | ORAL | 0 refills | Status: DC

## 2020-09-27 NOTE — Progress Notes (Signed)
GYNECOLOGY  VISIT   HPI: 49 y.o.   Legally Separated White or Caucasian Not Hispanic or Latino  female   [phone] with No LMP recorded. Patient is perimenopausal.   here for HRT consult. Patient states she stopped smoking on 07-15-20. She quit because her husband had to stop and she wanted to support him. She is doing okay off of cigarettes.    She was started on micronor in 12/18 after having a negative w/u for AUB. She was not a candidate for OCP's secondary to smoking.  In 3/19 she was started on gabapentin for night sweats (was already on a SSRI). Her cycles have been irregular on micronor. The last 3 months have been monthly. Can bleed for up to 2 weeks, a few days are heavy then light to spotting. Can have some random spotting. Very unpredictable.   She switched from gabapentin to lyrica over 6 months ago for shoulder pain. Lyrica helps some, night sweats are tolerable, don't occur every night. No hot flashes during the day.  She has a bad temper, can go from 0-60 in a few seconds. Over reacting. Happens every day, not cyclic. There are times it is worse. She has a Teacher, music who managers her medications. No longer on a SSRI.   She has no libido. She can have an orgasm, but much harder.   She has a h/o endometriosis, has had 4 laparoscopies.   GYNECOLOGIC HISTORY: No LMP recorded. Patient is perimenopausal. Contraception: POP Menopausal hormone therapy: none        OB History    Gravida  3   Para  2   Term  2   Preterm      AB  1   Living  2     SAB  1   TAB      Ectopic      Multiple      Live Births  2              Patient Active Problem List   Diagnosis Date Noted  . Anal fissure, unspecified 06/03/2016  . 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 Hip pain 02/14/2016  . Endometriosis 09/27/2013  . TMJ (temporomandibular joint syndrome) 02/10/2013  . ANEMIA-NOS 05/26/2009  . ALLERGIC RHINITIS 05/26/2009  .  PALPITATIONS 05/26/2009  . CARDIAC MURMUR 05/26/2009  . DYSPNEA 05/26/2009    Past Medical History:  Diagnosis Date  . Abnormal uterine bleeding   . Allergy   . Anxiety   . Arthritis   . Asthma   . Depression   . Fibroid   . GERD (gastroesophageal reflux disease)    tums prn  . H/O degenerative disc disease    neck  . Heart murmur   . PONV (postoperative nausea and vomiting)   . Rotator cuff tear    right side  . Ruptured disk   . Urinary incontinence     Past Surgical History:  Procedure Laterality Date  . ANOPLASTY    . APPENDECTOMY    . COLPOSCOPY    . DIAGNOSTIC LAPAROSCOPY     x3 for endometriosis  . GYNECOLOGIC CRYOSURGERY    . KNEE ARTHROSCOPY     right-x3  . LAPAROSCOPY  03/26/2012   Procedure: LAPAROSCOPY OPERATIVE;  Surgeon: Lovenia Kim, MD;  Location: [facility];  Service: Gynecology;  Laterality: N/A;  with ablation of endometriosis   . left hand surgery  03/11/12  . MOUTH SURGERY    .  PELVIC LAPAROSCOPY    . SHOULDER ARTHROSCOPY  right  . TENDON EXPLORATION  03/11/2012   Procedure: TENDON EXPLORATION;  Surgeon: Wynonia Sours, MD;  Location: [facility];  Service: Orthopedics;  Laterality: Left;  EXPLORATION OF FLEXOR TENDON LEFT INDEX FINGER       Current Outpatient Medications  Medication Sig Dispense Refill  . clonazePAM (KLONOPIN) 1 MG tablet Take 1 mg by mouth at bedtime. Take 1/2 tablet at bedtime.    . diclofenac Sodium (VOLTAREN) 1 % GEL Apply topically.    . dicyclomine (BENTYL) 20 MG tablet Take 20 mg by mouth every 6 (six) hours.    . norethindrone (MICRONOR) 0.35 MG tablet Take 1 tablet (0.35 mg total) by mouth daily. 84 tablet 0  . ondansetron (ZOFRAN) 4 MG tablet Take 4 mg by mouth 3 (three) times daily as needed.    . pregabalin (LYRICA) 75 MG capsule Take 75 mg by mouth 2 (two) times daily.    . traMADol (ULTRAM) 50 MG tablet Take 1-2 tablets (50-100 mg total) by mouth every 6 (six) hours as needed. 120 tablet 1  .  traZODone (DESYREL) 50 MG tablet Take 50-100 mg by mouth at bedtime.    Marland Kitchen VYVANSE 10 MG capsule Take 10 mg by mouth daily as needed.     No current facility-administered medications for this visit.     ALLERGIES: Erythromycin and Vancomycin cross reactors  Family History  Problem Relation Age of Onset  . Heart disease Mother   . Diabetes Mother   . Atrial fibrillation Mother   . Diabetes Father   . Hyperlipidemia Father   . Asthma Sister   . Heart disease Daughter   . Heart disease Brother   . Stroke Maternal Grandmother   . Dementia Paternal Grandmother     Social History   Socioeconomic History  . Marital status: Legally Separated    Spouse name: Not on file  . Number of children: Not on file  . Years of education: Not on file  . Highest education level: Not on file  Occupational History  . Not on file  Tobacco Use  . Smoking status: Current Every Day Smoker    Packs/day: 1.00    Years: 15.00    Pack years: 15.00    Types: Cigarettes  . Smokeless tobacco: Never Used  Vaping Use  . Vaping Use: Never used  Substance and Sexual Activity  . Alcohol use: Yes    Alcohol/week: 2.0 - 3.0 standard drinks    Types: 2 - 3 Cans of beer per week  . Drug use: No  . Sexual activity: Yes    Partners: Male    Birth control/protection: Pill  Other Topics Concern  . Not on file  Social History Narrative  . Not on file   Social Determinants of Health   Financial Resource Strain:   . Difficulty of Paying Living Expenses: Not on file  Food Insecurity:   . Worried About Charity fundraiser in the Last Year: Not on file  . Ran Out of Food in the Last Year: Not on file  Transportation Needs:   . Lack of Transportation (Medical): Not on file  . Lack of Transportation (Non-Medical): Not on file  Physical Activity:   . Days of Exercise per Week: Not on file  . Minutes of Exercise per Session: Not on file  Stress:   . Feeling of Stress : Not on file  Social Connections:   .  Frequency of Communication with Friends and Family: Not on file  . Frequency of Social Gatherings with Friends and Family: Not on file  . Attends Religious Services: Not on file  . Active Member of Clubs or Organizations: Not on file  . Attends Archivist Meetings: Not on file  . Marital Status: Not on file  Intimate Partner Violence:   . Fear of Current or Ex-Partner: Not on file  . Emotionally Abused: Not on file  . Physically Abused: Not on file  . Sexually Abused: Not on file    Review of Systems  Constitutional: Positive for malaise/fatigue.  Genitourinary:       Low libido   Endo/Heme/Allergies:       Hot/cold intolerance   Psychiatric/Behavioral:       Irritability      PHYSICAL EXAMINATION:    BP 130/74 (BP Location: Right Arm, Patient Position: Sitting, Cuff Size: Normal)   Pulse 89   Resp 14   Ht 5\' 9"  (1.753 m)   Wt 165 lb 8 oz (75.1 kg)   BMI 24.44 kg/m     General appearance: alert, cooperative and appears stated age  ASSESSMENT Abnormal bleeding on micronor Quit smoking, no contraindications to OCP's Night sweats helped with Lyrica Absent libido. Discussed the multifactorial causes of decreased libido in women.  Mood changes, she wonders if it is hormonal.     PLAN Discussed options of trying OCP's or the mirena IUD to improve her bleeding Discussed that if she needed ERT the mirena would provide endometrial protection  She would like to try OCP's, risks reviewed and information posted in mychart Trial of OCP's x 3 months F/U in 3 months Aware if she resumed smoking she couldn't be on OCP's Information on libido given # for [facility] given    ~30 minutes in total patient care, the vast majority in counseling

## 2020-09-27 NOTE — Patient Instructions (Addendum)
Oral Contraception Information Oral contraceptive pills (OCPs) are medicines taken to prevent pregnancy. OCPs are taken by mouth, and they work by:  Preventing the ovaries from releasing eggs.  Thickening mucus in the lower part of the uterus (cervix), which prevents sperm from entering the uterus.  Thinning the lining of the uterus (endometrium), which prevents a fertilized egg from attaching to the endometrium. OCPs are highly effective when taken exactly as prescribed. However, OCPs do not prevent STIs (sexually transmitted infections). Safe sex practices, such as using condoms while on an OCP, can help prevent STIs. Before starting OCPs Before you start taking OCPs, you may have a physical exam, blood test, and Pap test. However, you are not required to have a pelvic exam in order to be prescribed OCPs. Your health care provider will make sure you are a good candidate for oral contraception. OCPs are not a good option for certain women, including women who smoke and are older than 35 years, and women with a medical history of high blood pressure, deep vein thrombosis, pulmonary embolism, stroke, cardiovascular disease, or peripheral vascular disease. Discuss with your health care provider the possible side effects of the OCP you may be prescribed. When you start an OCP, be aware that it can take 2-3 months for your body to adjust to changes in hormone levels. Follow instructions from your health care provider about how to start taking your first cycle of OCPs. Depending on when you start the pill, you may need to use a backup form of birth control, such as condoms, during the first week. Make sure you know what steps to take if you ever forget to take the pill. Types of oral contraception  The most common types of birth control pills contain the hormones estrogen and progestin (synthetic progesterone) or progestin only. The combination pill This type of pill contains estrogen and progestin  hormones. Combination pills often come in packs of 21, 28, or 91 pills. For each pack, the last 7 pills may not contain hormones, which means you may stop taking the pills for 7 days. Menstrual bleeding occurs during the week that you do not take the pills or that you take the pills with no hormones in them. The minipill This type of pill contains the progestin hormone only. It comes in packs of 28 pills. All 28 pills contain the hormone. You take the pill every day. It is very important to take the pill at the same time each day. Advantages of oral contraceptive pills  Provides reliable and continuous contraception if taken as instructed.  May treat or decrease symptoms of: ? Menstrual period cramps. ? Irregular menstrual cycle or bleeding. ? Heavy menstrual flow. ? Abnormal uterine bleeding. ? Acne, depending on the type of pill. ? Polycystic ovarian syndrome. ? Endometriosis. ? Iron deficiency anemia. ? Premenstrual symptoms, including premenstrual dysphoric disorder.  May reduce the risk of endometrial and ovarian cancer.  Can be used as emergency contraception.  Prevents mislocated (ectopic) pregnancies and infections of the fallopian tubes. Things that can make oral contraceptive pills less effective OCPs can be less effective if:  You forget to take the pill at the same time every day. This is especially important when taking the minipill.  You have a stomach or intestinal disease that reduces your body's ability to absorb the pill.  You take OCPs with other medicines that make OCPs less effective, such as antibiotics, certain HIV medicines, and some seizure medicines.  You take expired OCPs.    You forget to restart the pill on day 7, if using the packs of 21 pills. Risks associated with oral contraceptive pills Oral contraceptive pills can sometimes cause side effects, such as:  Headache.  Depression.  Trouble sleeping.  Nausea and vomiting.  Breast  tenderness.  Irregular bleeding or spotting during the first several months.  Bloating or fluid retention.  Increase in blood pressure. Combination pills are also associated with a small increase in the risk of:  Blood clots.  Heart attack.  Stroke. Summary  Oral contraceptive pills are medicines taken by mouth to prevent pregnancy. They are highly effective when taken exactly as prescribed.  The most common types of birth control pills contain the hormones estrogen and progestin (synthetic progesterone) or progestin only.  Before you start taking the pill, you may have a physical exam, blood test, and Pap test. Your health care provider will make sure you are a good candidate for oral contraception.  The combination pill may come in a 21-day pack, a 28-day pack, or a 91-day pack. The minipill contains the progesterone hormone only and comes in packs of 28 pills.  Oral contraceptive pills can sometimes cause side effects, such as headache, nausea, breast tenderness, or irregular bleeding. This information is not intended to replace advice given to you by your health care provider. Make sure you discuss any questions you have with your health care provider. Document Revised: 11/21/2017 Document Reviewed: 03/04/2017 Elsevier Patient Education  [place] is the normal time of life before and after menstrual periods stop completely (menopause). Perimenopause can begin 2-8 years before menopause, and it usually lasts for 1 year after menopause. During perimenopause, the ovaries may or may not produce an egg. What are the causes? This condition is caused by a natural change in hormone levels that happens as you get older. What increases the risk? This condition is more likely to start at an earlier age if you have certain medical conditions or treatments, including:  A tumor of the pituitary gland in the brain.  A disease that affects the ovaries  and hormone production.  Radiation treatment for cancer.  Certain cancer treatments, such as chemotherapy or hormone (anti-estrogen) therapy.  Heavy smoking and excessive alcohol use.  Family history of early menopause. What are the signs or symptoms? Perimenopausal changes affect each woman differently. Symptoms of this condition may include:  Hot flashes.  Night sweats.  Irregular menstrual periods.  Decreased sex drive.  Vaginal dryness.  Headaches.  Mood swings.  Depression.  Memory problems or trouble concentrating.  Irritability.  Tiredness.  Weight gain.  Anxiety.  Trouble getting pregnant. How is this diagnosed? This condition is diagnosed based on your medical history, a physical exam, your age, your menstrual history, and your symptoms. Hormone tests may also be done. How is this treated? In some cases, no treatment is needed. You and your health care provider should make a decision together about whether treatment is necessary. Treatment will be based on your individual condition and preferences. Various treatments are available, such as:  Menopausal hormone therapy (MHT).  Medicines to treat specific symptoms.  Acupuncture.  Vitamin or herbal supplements. Before starting treatment, make sure to let your health care provider know if you have a personal or family history of:  Heart disease.  Breast cancer.  Blood clots.  Diabetes.  Osteoporosis. Follow these instructions at home: Lifestyle  Do not use any products that contain nicotine or tobacco, such as cigarettes and  e-cigarettes. If you need help quitting, ask your health care provider.  Eat a balanced diet that includes fresh fruits and vegetables, whole grains, soybeans, eggs, lean meat, and low-fat dairy.  Get at least 30 minutes of physical activity on 5 or more days each week.  Avoid alcoholic and caffeinated beverages, as well as spicy foods. This may help prevent hot  flashes.  Get 7-8 hours of sleep each night.  Dress in layers that can be removed to help you manage hot flashes.  Find ways to manage stress, such as deep breathing, meditation, or journaling. General instructions  Keep track of your menstrual periods, including: ? When they occur. ? How heavy they are and how long they last. ? How much time passes between periods.  Keep track of your symptoms, noting when they start, how often you have them, and how long they last.  Take over-the-counter and prescription medicines only as told by your health care provider.  Take vitamin supplements only as told by your health care provider. These may include calcium, vitamin E, and vitamin D.  Use vaginal lubricants or moisturizers to help with vaginal dryness and improve comfort during sex.  Talk with your health care provider before starting any herbal supplements.  Keep all follow-up visits as told by your health care provider. This is important. This includes any group therapy or counseling. Contact a health care provider if:  You have heavy vaginal bleeding or pass blood clots.  Your period lasts more than 2 days longer than normal.  Your periods are recurring sooner than 21 days.  You bleed after having sex. Get help right away if:  You have chest pain, trouble breathing, or trouble talking.  You have severe depression.  You have pain when you urinate.  You have severe headaches.  You have vision problems. Summary  Perimenopause is the time when a woman's body begins to move into menopause. This may happen naturally or as a result of other health problems or medical treatments.  Perimenopause can begin 2-8 years before menopause, and it usually lasts for 1 year after menopause.  Perimenopausal symptoms can be managed through medicines, lifestyle changes, and complementary therapies such as acupuncture. This information is not intended to replace advice given to you by your  health care provider. Make sure you discuss any questions you have with your health care provider. Document Revised: 11/21/2017 Document Reviewed: 01/14/2017 Elsevier Patient Education  2020 [employer].

## 2020-10-25 ENCOUNTER — Other Ambulatory Visit: Payer: Self-pay | Admitting: Obstetrics and Gynecology

## 2020-12-13 ENCOUNTER — Other Ambulatory Visit: Payer: Self-pay | Admitting: Obstetrics and Gynecology

## 2020-12-13 NOTE — Telephone Encounter (Signed)
Medication refill request: JUNEL FE Last AEX:  08/25/19 JJ Next AEX: 12/27/20 Last MMG (if hormonal medication request): 07/15/19 BIRADS 1 negative/density b -- per [facility], no MMG done this year Refill authorized: today, please advise

## 2020-12-26 NOTE — Progress Notes (Deleted)
GYNECOLOGY  VISIT   HPI: 50 y.o.   Legally Separated White or Caucasian Not Hispanic or Latino  female   [phone] with No LMP recorded. Patient is perimenopausal.   here for   3 month OCP follow up   GYNECOLOGIC HISTORY: No LMP recorded. Patient is perimenopausal. Contraception: OCP  Menopausal hormone therapy: none        OB History    Gravida  3   Para  2   Term  2   Preterm      AB  1   Living  2     SAB  1   IAB      Ectopic      Multiple      Live Births  2              Patient Active Problem List   Diagnosis Date Noted  . Anal fissure, unspecified 06/03/2016  . 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 Hip pain 02/14/2016  . Endometriosis 09/27/2013  . TMJ (temporomandibular joint syndrome) 02/10/2013  . ANEMIA-NOS 05/26/2009  . ALLERGIC RHINITIS 05/26/2009  . PALPITATIONS 05/26/2009  . CARDIAC MURMUR 05/26/2009  . DYSPNEA 05/26/2009    Past Medical History:  Diagnosis Date  . Abnormal uterine bleeding   . Allergy   . Anxiety   . Arthritis   . Asthma   . Depression   . Fibroid   . GERD (gastroesophageal reflux disease)    tums prn  . H/O degenerative disc disease    neck  . Heart murmur   . PONV (postoperative nausea and vomiting)   . Rotator cuff tear    right side  . Ruptured disk   . Urinary incontinence     Past Surgical History:  Procedure Laterality Date  . ANOPLASTY    . APPENDECTOMY    . COLPOSCOPY    . DIAGNOSTIC LAPAROSCOPY     x3 for endometriosis  . GYNECOLOGIC CRYOSURGERY    . KNEE ARTHROSCOPY     right-x3  . LAPAROSCOPY  03/26/2012   Procedure: LAPAROSCOPY OPERATIVE;  Surgeon: Lovenia Kim, MD;  Location: [facility];  Service: Gynecology;  Laterality: N/A;  with ablation of endometriosis   . left hand surgery  03/11/12  . MOUTH SURGERY    . PELVIC LAPAROSCOPY    . SHOULDER ARTHROSCOPY  right  . TENDON EXPLORATION  03/11/2012   Procedure: TENDON EXPLORATION;  Surgeon: Wynonia Sours, MD;  Location: [facility];  Service: Orthopedics;  Laterality: Left;  EXPLORATION OF FLEXOR TENDON LEFT INDEX FINGER       Current Outpatient Medications  Medication Sig Dispense Refill  . JUNEL FE 1/20 1-20 MG-MCG tablet TAKE 1 TABLET BY MOUTH EVERY DAY 84 tablet 0  . clonazePAM (KLONOPIN) 1 MG tablet Take 1 mg by mouth at bedtime. Take 1/2 tablet at bedtime.    . diclofenac Sodium (VOLTAREN) 1 % GEL Apply topically.    . dicyclomine (BENTYL) 20 MG tablet Take 20 mg by mouth every 6 (six) hours.    . ondansetron (ZOFRAN) 4 MG tablet Take 4 mg by mouth 3 (three) times daily as needed.    . pregabalin (LYRICA) 75 MG capsule Take 75 mg by mouth 2 (two) times daily.    . traMADol (ULTRAM) 50 MG tablet Take 1-2 tablets (50-100 mg total) by mouth every 6 (six) hours as needed. 120 tablet 1  . traZODone (DESYREL) 50 MG  tablet Take 50-100 mg by mouth at bedtime.    Marland Kitchen VYVANSE 10 MG capsule Take 10 mg by mouth daily as needed.     No current facility-administered medications for this visit.     ALLERGIES: Erythromycin and Vancomycin cross reactors  Family History  Problem Relation Age of Onset  . Heart disease Mother   . Diabetes Mother   . Atrial fibrillation Mother   . Diabetes Father   . Hyperlipidemia Father   . Asthma Sister   . Heart disease Daughter   . Heart disease Brother   . Stroke Maternal Grandmother   . Dementia Paternal Grandmother     Social History   Socioeconomic History  . Marital status: Legally Separated    Spouse name: Not on file  . Number of children: Not on file  . Years of education: Not on file  . Highest education level: Not on file  Occupational History  . Not on file  Tobacco Use  . Smoking status: Former Smoker    Packs/day: 1.00    Years: 15.00    Pack years: 15.00    Types: Cigarettes    Quit date: 07/15/2020    Years since quitting: 0.4  . Smokeless tobacco: Never Used  Vaping Use  . Vaping Use: Never used   Substance and Sexual Activity  . Alcohol use: Yes    Alcohol/week: 2.0 - 3.0 standard drinks    Types: 2 - 3 Cans of beer per week  . Drug use: No  . Sexual activity: Yes    Partners: Male    Birth control/protection: Pill  Other Topics Concern  . Not on file  Social History Narrative  . Not on file   Social Determinants of Health   Financial Resource Strain: Not on file  Food Insecurity: Not on file  Transportation Needs: Not on file  Physical Activity: Not on file  Stress: Not on file  Social Connections: Not on file  Intimate Partner Violence: Not on file    ROS  PHYSICAL EXAMINATION:    There were no vitals taken for this visit.    General appearance: alert, cooperative and appears stated age Neck: no adenopathy, supple, symmetrical, trachea midline and thyroid {CHL AMB PHY EX THYROID NORM DEFAULT:[phone]::"normal to inspection and palpation"} Breasts: {Exam; breast:13139::"normal appearance, no masses or tenderness"} Abdomen: soft, non-tender; non distended, no masses,  no organomegaly  Pelvic: External genitalia:  no lesions              Urethra:  normal appearing urethra with no masses, tenderness or lesions              Bartholins and Skenes: normal                 Vagina: normal appearing vagina with normal color and discharge, no lesions              Cervix: {CHL AMB PHY EX CERVIX NORM [phone]::"no lesions"}              Bimanual Exam:  Uterus:  {CHL AMB PHY EX UTERUS NORM DEFAULT:[id number]::"normal size, contour, position, consistency, mobility, non-tender"}              Adnexa: {CHL AMB PHY EX ADNEXA NO MASS DEFAULT:[phone]::"no mass, fullness, tenderness"}              Rectovaginal: {yes no:[phone]}.  Confirms.              Anus:  normal sphincter tone,  no lesions  Chaperone was present for exam.  ASSESSMENT     PLAN    An After Visit Summary was printed and given to the patient.  *** minutes face to face time of which over 50%  was spent in counseling.

## 2020-12-27 ENCOUNTER — Ambulatory Visit: Payer: Medicaid Other | Admitting: Obstetrics and Gynecology

## 2021-02-28 ENCOUNTER — Other Ambulatory Visit: Payer: Self-pay | Admitting: Obstetrics and Gynecology

## 2021-03-28 NOTE — Progress Notes (Signed)
50 y.o. [id number] Legally Separated White or Caucasian Not Hispanic or Latino female here for annual exam.  She started on OCP's, doing well, mood is better, vasomotor symptoms are better.  Period Cycle (Days): 28 (Occas skip a month) Period Duration (Days): 7 (Occas up to 15 days) Period Pattern: Regular Menstrual Flow: Light Dysmenorrhea: (!) Mild Dysmenorrhea Symptoms: Cramping  Her prolonged bleeding is spotting. No late or missed pills.  Most months she is bleeding for 10-14 days.   Cold all of the time. Fatigue  Occasional deep dyspareunia, 3 out of 10 times, positional  She has a h/o endometriosis, has had 4 laparoscopies.   Patient's last menstrual period was 03/09/2021.          Sexually active: Yes.    The current method of family planning is OCP (estrogen/progesterone).    Exercising: No.   Smoker:  no  Health Maintenance: Pap:  04/13/2018 WNL NEG HPV, 09/09/16 WNL History of abnormal Pap:  Yes, colposcopy2008 MMG:  07/15/2019 density B Bi-rads 1 neg, has appointment  BMD:  Never  Colonoscopy: 2020 inflammation of the ileum, due for another colonoscopy this year TDaP:  2014  Gardasil: none    reports that she quit smoking about 8 months ago. Her smoking use included cigarettes. She has a 15.00 pack-year smoking history. She has never used smokeless tobacco. She reports current alcohol use of about 2.0 - 3.0 standard drinks of alcohol per week. She reports that she does not use drugs. She is working as Building control surveyor.   Past Medical History:  Diagnosis Date  . Abnormal uterine bleeding   . Allergy   . Anxiety   . Arthritis   . Asthma   . Depression   . Fibroid   . GERD (gastroesophageal reflux disease)    tums prn  . H/O degenerative disc disease    neck  . Heart murmur   . PONV (postoperative nausea and vomiting)   . Rotator cuff tear    right side  . Ruptured disk   . Urinary incontinence     Past Surgical History:  Procedure Laterality Date  . ANOPLASTY     . APPENDECTOMY    . COLPOSCOPY    . DIAGNOSTIC LAPAROSCOPY     x3 for endometriosis  . GYNECOLOGIC CRYOSURGERY    . KNEE ARTHROSCOPY     right-x3  . LAPAROSCOPY  03/26/2012   Procedure: LAPAROSCOPY OPERATIVE;  Surgeon: Lovenia Kim, MD;  Location: [facility];  Service: Gynecology;  Laterality: N/A;  with ablation of endometriosis   . left hand surgery  03/11/12  . MOUTH SURGERY    . PELVIC LAPAROSCOPY    . SHOULDER ARTHROSCOPY  right  . TENDON EXPLORATION  03/11/2012   Procedure: TENDON EXPLORATION;  Surgeon: Wynonia Sours, MD;  Location: [facility];  Service: Orthopedics;  Laterality: Left;  EXPLORATION OF FLEXOR TENDON LEFT INDEX FINGER       Current Outpatient Medications  Medication Sig Dispense Refill  . clonazePAM (KLONOPIN) 1 MG tablet Take 1 mg by mouth at bedtime. Take 1/2 tablet at bedtime.    Lenda Kelp FE 1/20 1-20 MG-MCG tablet TAKE 1 TABLET BY MOUTH EVERY DAY 84 tablet 0  . lisdexamfetamine (VYVANSE) 50 MG capsule Take 50 mg by mouth daily.    . ondansetron (ZOFRAN) 4 MG tablet Take 4 mg by mouth 3 (three) times daily as needed.    . pregabalin (LYRICA) 75 MG capsule Take 75 mg by mouth 2 (  two) times daily.    . traMADol (ULTRAM) 50 MG tablet Take 1-2 tablets (50-100 mg total) by mouth every 6 (six) hours as needed. 120 tablet 1  . traZODone (DESYREL) 50 MG tablet Take 50-100 mg by mouth at bedtime.    . diclofenac Sodium (VOLTAREN) 1 % GEL Apply topically. (Patient not taking: Reported on 03/30/2021)    . dicyclomine (BENTYL) 20 MG tablet Take 20 mg by mouth every 6 (six) hours. (Patient not taking: Reported on 03/30/2021)     No current facility-administered medications for this visit.    Family History  Problem Relation Age of Onset  . Heart disease Mother   . Diabetes Mother   . Atrial fibrillation Mother   . Diabetes Father   . Hyperlipidemia Father   . Asthma Sister   . Heart disease Daughter   . Heart disease Brother   . Stroke Maternal  Grandmother   . Dementia Paternal Grandmother     Review of Systems  Exam:   BP 134/84   Ht 5\' 9"  (1.753 m)   Wt 158 lb (71.7 kg)   LMP 03/09/2021   BMI 23.33 kg/m   Weight change: @WEIGHTCHANGE @ Height:   Height: 5\' 9"  (175.3 cm)  Ht Readings from Last 3 Encounters:  03/30/21 5\' 9"  (1.753 m)  09/27/20 5\' 9"  (1.753 m)  08/25/19 5' 9.25" (1.759 m)    General appearance: alert, cooperative and appears stated age Head: Normocephalic, without obvious abnormality, atraumatic Neck: no adenopathy, supple, symmetrical, trachea midline and thyroid normal to inspection and palpation Lungs: clear to auscultation bilaterally Cardiovascular: regular rate and rhythm Breasts: normal appearance, no masses or tenderness Abdomen: soft, non-tender; non distended,  no masses,  no organomegaly Extremities: extremities normal, atraumatic, no cyanosis or edema Skin: Skin color, texture, turgor normal. No rashes or lesions Lymph nodes: Cervical, supraclavicular, and axillary nodes normal. No abnormal inguinal nodes palpated Neurologic: Grossly normal   Pelvic: External genitalia:  no lesions              Urethra:  normal appearing urethra with no masses, tenderness or lesions              Bartholins and Skenes: normal                 Vagina: normal appearing vagina with normal color and discharge, no lesions              Cervix: no lesions               Bimanual Exam:  Uterus:  normal size, contour, position, consistency, mobility, non-tender and anteverted              Adnexa: no mass, fullness, tenderness               Rectovaginal: Confirms               Anus:  normal sphincter tone, no lesions  Caryn Bee chaperoned for the exam.  1. Well woman exam Discussed breast self exam Discussed calcium and vit D intake Mammogram scheduled Colonoscopy f/u is this year  2. Breakthrough bleeding on birth control pills Bleeding for 10-14 days a month, no late or missed pills - TSH - US PELVIS  TRANSVAGINAL NON-OB (TV ONLY); Future  3. Abnormal uterine bleeding - TSH - US PELVIS TRANSVAGINAL NON-OB (TV ONLY); Future - Cytology - PAP with hpv  4. Cold intolerance - CBC - TSH  5. Other fatigue TSH, CBD  6. Encounter for surveillance of contraceptive pills Overall doing well, having abnormal bleeding   7. Screening for cervical cancer - Cytology - PAP with hpv

## 2021-03-30 ENCOUNTER — Other Ambulatory Visit (HOSPITAL_COMMUNITY)
Admission: RE | Admit: 2021-03-30 | Discharge: 2021-03-30 | Disposition: A | Discharge status: Home / Self Care | Payer: Medicaid Other | Source: Ambulatory Visit | Attending: Obstetrics and Gynecology | Admitting: Obstetrics and Gynecology

## 2021-03-30 ENCOUNTER — Encounter: Payer: Self-pay | Admitting: Obstetrics and Gynecology

## 2021-03-30 ENCOUNTER — Other Ambulatory Visit: Payer: Self-pay

## 2021-03-30 ENCOUNTER — Ambulatory Visit: Payer: Medicaid Other | Admitting: Obstetrics and Gynecology

## 2021-03-30 VITALS — BP 134/84 | Ht 69.0 in | Wt 158.0 lb

## 2021-03-30 DIAGNOSIS — N939 Abnormal uterine and vaginal bleeding, unspecified: Secondary | ICD-10-CM | POA: Diagnosis not present

## 2021-03-30 DIAGNOSIS — Z3041 Encounter for surveillance of contraceptive pills: Secondary | ICD-10-CM

## 2021-03-30 DIAGNOSIS — N921 Excessive and frequent menstruation with irregular cycle: Secondary | ICD-10-CM | POA: Diagnosis not present

## 2021-03-30 DIAGNOSIS — R5383 Other fatigue: Secondary | ICD-10-CM

## 2021-03-30 DIAGNOSIS — Z124 Encounter for screening for malignant neoplasm of cervix: Secondary | ICD-10-CM

## 2021-03-30 DIAGNOSIS — Z01419 Encounter for gynecological examination (general) (routine) without abnormal findings: Secondary | ICD-10-CM

## 2021-03-30 DIAGNOSIS — R6889 Other general symptoms and signs: Secondary | ICD-10-CM | POA: Diagnosis not present

## 2021-03-30 NOTE — Patient Instructions (Signed)

## 2021-03-31 LAB — TSH: TSH: 1.14 mIU/L

## 2021-03-31 LAB — CBC
HCT: 42.4 % (ref 35.0–45.0)
Hemoglobin: 14 g/dL (ref 11.7–15.5)
MCH: 32.9 pg (ref 27.0–33.0)
MCHC: 33 g/dL (ref 32.0–36.0)
MCV: 99.8 fL (ref 80.0–100.0)
MPV: 9.7 fL (ref 7.5–12.5)
Platelets: 401 10*3/uL — ABNORMAL HIGH (ref 140–400)
RBC: 4.25 10*6/uL (ref 3.80–5.10)
RDW: 11.7 % (ref 11.0–15.0)
WBC: 7.8 10*3/uL (ref 3.8–10.8)

## 2021-04-02 LAB — CYTOLOGY - PAP
Comment: NEGATIVE
Diagnosis: NEGATIVE
High risk HPV: NEGATIVE

## 2021-04-23 ENCOUNTER — Telehealth: Payer: Self-pay | Admitting: *Deleted

## 2021-04-23 NOTE — Telephone Encounter (Signed)
If she doesn't want to come in, I would recommend she try an over the counter yeast medication (ie monistat). If that doesn't work she should be seen.

## 2021-04-23 NOTE — Telephone Encounter (Signed)
Patient informed with below note reports Monistat does not work. Patient said she will call back and schedule visit.

## 2021-04-23 NOTE — Telephone Encounter (Signed)
Patient called c/o yeast infection itching and white discharge requesting Rx to help for yeast. Reports you have done this is past. I did explain office visit is best. Patient said she was just here and she has recurrent yeast, office visit declined. Please advise

## 2021-05-03 ENCOUNTER — Encounter: Payer: Self-pay | Admitting: Obstetrics and Gynecology

## 2021-05-03 ENCOUNTER — Ambulatory Visit (INDEPENDENT_AMBULATORY_CARE_PROVIDER_SITE_OTHER): Payer: Medicaid Other | Admitting: Obstetrics and Gynecology

## 2021-05-03 ENCOUNTER — Other Ambulatory Visit: Payer: Self-pay

## 2021-05-03 ENCOUNTER — Ambulatory Visit (INDEPENDENT_AMBULATORY_CARE_PROVIDER_SITE_OTHER): Payer: Medicaid Other

## 2021-05-03 VITALS — BP 122/80 | HR 80 | Ht 69.0 in | Wt 156.0 lb

## 2021-05-03 DIAGNOSIS — N939 Abnormal uterine and vaginal bleeding, unspecified: Secondary | ICD-10-CM

## 2021-05-03 DIAGNOSIS — Z3009 Encounter for other general counseling and advice on contraception: Secondary | ICD-10-CM | POA: Diagnosis not present

## 2021-05-03 DIAGNOSIS — N921 Excessive and frequent menstruation with irregular cycle: Secondary | ICD-10-CM

## 2021-05-03 NOTE — Progress Notes (Signed)
GYNECOLOGY  VISIT   HPI: 50 y.o.   Legally Separated White or Caucasian Not Hispanic or Latino  female   [phone] with Patient's last menstrual period was 04/01/2021.   here for  Ultrasound consult for abnormal bleeding on OCP's. Her cycles are monthly for 7-15 days. Most months she bleeds for 10-14 days, light to spotting. Last month she had a normal pap, normal CBC and normal TSH.   She originally was started on micronor in 12/18 after having a negative w/u for AUB, at that time she was a smoker and couldn't be on OCP's. On micronor her cycles were irregular, could bleed for up to 2 weeks. In 10/21 she was switched to OCP's (no longer smoking).  No late or missed pills. She is taking her pills cyclically. Never gets her cycle when she is supposed to. In 3/22 she bleed for 10 days, stopped for a few days then bleed again. The bleeding isn't heavy, just continues, passes some clots. She uses a super tampon, could mostly wear it for 8 hours. Clots are thin and long (inch long). She has cramps, not as bad as prior to OCP's. Not always with her periods.  Feels the pill helps her moods.   GYNECOLOGIC HISTORY: Patient's last menstrual period was 04/01/2021. Contraception:ocp Menopausal hormone therapy: none         OB History    Gravida  3   Para  2   Term  2   Preterm      AB  1   Living  2     SAB  1   IAB      Ectopic      Multiple      Live Births  2              Patient Active Problem List   Diagnosis Date Noted  . Anal fissure, unspecified 06/03/2016  . 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 Hip pain 02/14/2016  . Endometriosis 09/27/2013  . TMJ (temporomandibular joint syndrome) 02/10/2013  . ANEMIA-NOS 05/26/2009  . ALLERGIC RHINITIS 05/26/2009  . PALPITATIONS 05/26/2009  . CARDIAC MURMUR 05/26/2009  . DYSPNEA 05/26/2009    Past Medical History:  Diagnosis Date  . Abnormal uterine bleeding   . Allergy   .  Anxiety   . Arthritis   . Asthma   . Depression   . Fibroid   . GERD (gastroesophageal reflux disease)    tums prn  . H/O degenerative disc disease    neck  . Heart murmur   . PONV (postoperative nausea and vomiting)   . Rotator cuff tear    right side  . Ruptured disk   . Urinary incontinence     Past Surgical History:  Procedure Laterality Date  . ANOPLASTY    . APPENDECTOMY    . COLPOSCOPY    . DIAGNOSTIC LAPAROSCOPY     x3 for endometriosis  . GYNECOLOGIC CRYOSURGERY    . KNEE ARTHROSCOPY     right-x3  . LAPAROSCOPY  03/26/2012   Procedure: LAPAROSCOPY OPERATIVE;  Surgeon: Lovenia Kim, MD;  Location: [facility];  Service: Gynecology;  Laterality: N/A;  with ablation of endometriosis   . left hand surgery  03/11/12  . MOUTH SURGERY    . PELVIC LAPAROSCOPY    . SHOULDER ARTHROSCOPY  right  . TENDON EXPLORATION  03/11/2012   Procedure: TENDON EXPLORATION;  Surgeon: Wynonia Sours, MD;  Location: [facility];  Service: Orthopedics;  Laterality: Left;  EXPLORATION OF FLEXOR TENDON LEFT INDEX FINGER       Current Outpatient Medications  Medication Sig Dispense Refill  . clonazePAM (KLONOPIN) 1 MG tablet Take 1 mg by mouth at bedtime. Take 1/2 tablet at bedtime.    . diclofenac Sodium (VOLTAREN) 1 % GEL Apply topically.    Lenda Kelp FE 1/20 1-20 MG-MCG tablet TAKE 1 TABLET BY MOUTH EVERY DAY 84 tablet 0  . lisdexamfetamine (VYVANSE) 50 MG capsule Take 50 mg by mouth daily.    . ondansetron (ZOFRAN) 4 MG tablet Take 4 mg by mouth 3 (three) times daily as needed.    . pregabalin (LYRICA) 75 MG capsule Take 75 mg by mouth 2 (two) times daily.    . traMADol (ULTRAM) 50 MG tablet Take 1-2 tablets (50-100 mg total) by mouth every 6 (six) hours as needed. 120 tablet 1  . traZODone (DESYREL) 50 MG tablet Take 50-100 mg by mouth at bedtime.     No current facility-administered medications for this visit.     ALLERGIES: Erythromycin, Hydrocodone, and Vancomycin cross  reactors  Family History  Problem Relation Age of Onset  . Heart disease Mother   . Diabetes Mother   . Atrial fibrillation Mother   . Diabetes Father   . Hyperlipidemia Father   . Asthma Sister   . Heart disease Daughter   . Heart disease Brother   . Stroke Maternal Grandmother   . Dementia Paternal Grandmother     Social History   Socioeconomic History  . Marital status: Legally Separated    Spouse name: Not on file  . Number of children: Not on file  . Years of education: Not on file  . Highest education level: Not on file  Occupational History  . Not on file  Tobacco Use  . Smoking status: Former Smoker    Packs/day: 1.00    Years: 15.00    Pack years: 15.00    Types: Cigarettes    Quit date: 07/15/2020    Years since quitting: 0.8  . Smokeless tobacco: Never Used  Vaping Use  . Vaping Use: Never used  Substance and Sexual Activity  . Alcohol use: Yes    Alcohol/week: 2.0 - 3.0 standard drinks    Types: 2 - 3 Cans of beer per week  . Drug use: No  . Sexual activity: Yes    Partners: Male    Birth control/protection: Pill  Other Topics Concern  . Not on file  Social History Narrative  . Not on file   Social Determinants of Health   Financial Resource Strain: Not on file  Food Insecurity: Not on file  Transportation Needs: Not on file  Physical Activity: Not on file  Stress: Not on file  Social Connections: Not on file  Intimate Partner Violence: Not on file    Review of Systems  All other systems reviewed and are negative.   PHYSICAL EXAMINATION:    BP 122/80   Pulse 80   Ht 5\' 9"  (1.753 m)   Wt 156 lb (70.8 kg)   LMP 04/01/2021   SpO2 97%   BMI 23.04 kg/m     General appearance: alert, cooperative and appears stated age  Pelvic ultrasound  Indications: Abnormal uterine bleeding on OCP's  Findings:  Uterus 8.43 x 5.95 x 5.27 cm  Fibroids:   1) 1.09 x 0.77 cm, intramural   2) 0.45 x 0.59 cm, intramural  Endometrium 5.53  mm  Left ovary 2.33 x 1.11 x 1.0 cm  Right ovary 2.58 x 1.85 x 1.65 cm  9.5 x 5.5 mm follicle  No free fluid   Impression:  Normal sized anteverted uterus with 2 small intramural myoma's Thin, symmetrical endometrium Normal ovaries bilaterally No adnexal masses No free fluid  1. Abnormal uterine bleeding She continues to have abnormal bleeding on OCP's, takes her pills at the same time daily, no late pills. Normal Pap, CBC, TSH and ultrasound - Endometrial biopsy; Future -Discussed trying a higher low dose pill, discussed the mirena IUD  2. General counseling and advice on female contraception Reviewed the insertion, the side effects and the risks of the mirena IUD. She would like a mirena Will return for endometrial biopsy and mirena IUD insertion the same day - IUD Insertion; Future  In addition to reviewing the ultrasound images and results with the patient, over 20 minutes was spent in total patient care in regards to management.

## 2021-05-08 ENCOUNTER — Encounter: Payer: Self-pay | Admitting: Obstetrics and Gynecology

## 2021-05-28 ENCOUNTER — Other Ambulatory Visit: Payer: Self-pay | Admitting: Obstetrics and Gynecology

## 2021-05-29 ENCOUNTER — Telehealth: Payer: Self-pay | Admitting: *Deleted

## 2021-05-29 MED ORDER — NORETHIN ACE-ETH ESTRAD-FE 1-20 MG-MCG PO TABS: 1.0000 | ORAL_TABLET | Freq: Every day | ORAL | 0 refills | Status: DC

## 2021-05-29 NOTE — Telephone Encounter (Signed)
Left message for patent to call.

## 2021-05-29 NOTE — Telephone Encounter (Signed)
Patient scheduled on 06/14/21 for IUD insertion and endometrial biopsy. Patient said she is having second thoughts about having IUD inserted, reports she feels great on the pill mood wise and is worried about stopping the pills due to this. She is going to keep the endometrial biopsy part of the appointment,but would like a refill on pills as she will need a new pack for next week. Please advise

## 2021-05-29 NOTE — Telephone Encounter (Signed)
Patient informed, Rx sent.  

## 2021-05-29 NOTE — Telephone Encounter (Signed)
Please send in another pack of pills for her. She can stay on the pills for now if desired. Typically we start trying to get women off of OCP's around age 50 (average age of menopause). We can discuss her options again at her endometrial biopsy appointment.

## 2021-06-07 ENCOUNTER — Telehealth: Payer: Self-pay | Admitting: *Deleted

## 2021-06-07 MED ORDER — NORETHIN ACE-ETH ESTRAD-FE 1-20 MG-MCG PO TABS
1.0000 | ORAL_TABLET | Freq: Every day | ORAL | 0 refills | Status: DC
Start: 2021-06-07 — End: 2021-06-26

## 2021-06-07 NOTE — Telephone Encounter (Signed)
Patient called stating she picked up her birth control pack and now has lost the pack at home from 05/29/21. She asked if I could send another pack to the pharmacy. Aware she will to discuss lost pack with insurance/ pharmacy.

## 2021-06-14 ENCOUNTER — Other Ambulatory Visit: Payer: Self-pay

## 2021-06-14 ENCOUNTER — Other Ambulatory Visit (HOSPITAL_COMMUNITY)
Admission: RE | Admit: 2021-06-14 | Discharge: 2021-06-14 | Disposition: A | Discharge status: Home / Self Care | Payer: Medicaid Other | Source: Ambulatory Visit | Attending: Obstetrics and Gynecology | Admitting: Obstetrics and Gynecology

## 2021-06-14 ENCOUNTER — Ambulatory Visit (INDEPENDENT_AMBULATORY_CARE_PROVIDER_SITE_OTHER): Payer: Medicaid Other | Admitting: Obstetrics and Gynecology

## 2021-06-14 ENCOUNTER — Encounter: Payer: Self-pay | Admitting: Obstetrics and Gynecology

## 2021-06-14 VITALS — BP 124/80

## 2021-06-14 DIAGNOSIS — Z01812 Encounter for preprocedural laboratory examination: Secondary | ICD-10-CM

## 2021-06-14 DIAGNOSIS — N939 Abnormal uterine and vaginal bleeding, unspecified: Secondary | ICD-10-CM

## 2021-06-14 DIAGNOSIS — Z3009 Encounter for other general counseling and advice on contraception: Secondary | ICD-10-CM

## 2021-06-14 LAB — PREGNANCY, URINE: Preg Test, Ur: NEGATIVE

## 2021-06-14 NOTE — Progress Notes (Signed)
GYNECOLOGY  VISIT   HPI: 50 y.o.   Legally Separated White or Caucasian Not Hispanic or Latino  female   [phone] with Patient's last menstrual period was 05/22/2021.   here for   Endometrial Bx and Mirena insertion She has been having persistent AUB on low dose OCP's. Normal Pap, CBC, TSH and ultrasound. She feels better overall on OCP's and doesn't want to go off. Will hold on the IUD for now.     GYNECOLOGIC HISTORY: Patient's last menstrual period was 05/22/2021. 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   Hip pain 02/14/2016   Endometriosis 09/27/2013   TMJ (temporomandibular joint syndrome) 02/10/2013   ANEMIA-NOS 05/26/2009   ALLERGIC RHINITIS 05/26/2009   PALPITATIONS 05/26/2009   CARDIAC MURMUR 05/26/2009   DYSPNEA 05/26/2009    Past Medical History:  Diagnosis Date   Abnormal uterine bleeding    Allergy    Anxiety    Arthritis    Asthma    Depression    Fibroid    GERD (gastroesophageal reflux disease)    tums prn   H/O degenerative disc disease    neck   Heart murmur    PONV (postoperative nausea and vomiting)    Rotator cuff tear    right side   Ruptured disk    Urinary incontinence     Past Surgical History:  Procedure Laterality Date   ANOPLASTY     APPENDECTOMY     COLPOSCOPY     DIAGNOSTIC LAPAROSCOPY     x3 for endometriosis   GYNECOLOGIC CRYOSURGERY     KNEE ARTHROSCOPY     right-x3   LAPAROSCOPY  03/26/2012   Procedure: LAPAROSCOPY OPERATIVE;  Surgeon: Lovenia Kim, MD;  Location: [facility];  Service: Gynecology;  Laterality: N/A;  with ablation of endometriosis    left  hand surgery  03/11/12   MOUTH SURGERY     PELVIC LAPAROSCOPY     SHOULDER ARTHROSCOPY  right   TENDON EXPLORATION  03/11/2012   Procedure: TENDON EXPLORATION;  Surgeon: Wynonia Sours, MD;  Location: [facility];  Service: Orthopedics;  Laterality: Left;  EXPLORATION OF FLEXOR TENDON LEFT INDEX FINGER       Current Outpatient Medications  Medication Sig Dispense Refill   clonazePAM (KLONOPIN) 1 MG tablet Take 1 mg by mouth at bedtime. Take 1/2 tablet at bedtime.     lisdexamfetamine (VYVANSE) 50 MG capsule Take 50 mg by mouth daily.     norethindrone-ethinyl estradiol-FE (JUNEL FE 1/20) 1-20 MG-MCG tablet Take 1 tablet by mouth daily. 28 tablet 0   ondansetron (ZOFRAN) 4 MG tablet Take 4 mg by mouth 3 (three) times daily as needed.     pregabalin (LYRICA) 75 MG capsule Take 75 mg by mouth 2 (two) times daily.     traMADol (ULTRAM) 50 MG tablet  Take 1-2 tablets (50-100 mg total) by mouth every 6 (six) hours as needed. 120 tablet 1   traZODone (DESYREL) 50 MG tablet Take 50-100 mg by mouth at bedtime.     VITAMIN D PO Take 50,000 Int'l Units/1.66m2 by mouth once a week.     diclofenac Sodium (VOLTAREN) 1 % GEL Apply topically. (Patient not taking: Reported on 06/14/2021)     No current facility-administered medications for this visit.     ALLERGIES: Erythromycin, Hydrocodone, and Vancomycin cross reactors  Family History  Problem Relation Age of Onset   Heart disease Mother    Diabetes Mother    Atrial fibrillation Mother    Diabetes Father    Hyperlipidemia Father    Asthma Sister    Heart disease Daughter    Heart disease Brother    Stroke Maternal Grandmother    Dementia Paternal Grandmother     Social History   Socioeconomic History   Marital status: Legally Separated    Spouse name: Not on file   Number of children: Not on file   Years of education: Not on file   Highest education level: Not on file  Occupational History   Not on file  Tobacco Use    Smoking status: Former    Packs/day: 1.00    Years: 15.00    Pack years: 15.00    Types: Cigarettes    Quit date: 07/15/2020    Years since quitting: 0.9   Smokeless tobacco: Never  Vaping Use   Vaping Use: Never used  Substance and Sexual Activity   Alcohol use: Yes    Alcohol/week: 2.0 - 3.0 standard drinks    Types: 2 - 3 Cans of beer per week   Drug use: No   Sexual activity: Yes    Partners: Male    Birth control/protection: Pill  Other Topics Concern   Not on file  Social History Narrative   Not on file   Social Determinants of Health   Financial Resource Strain: Not on file  Food Insecurity: Not on file  Transportation Needs: Not on file  Physical Activity: Not on file  Stress: Not on file  Social Connections: Not on file  Intimate Partner Violence: Not on file    ROS  PHYSICAL EXAMINATION:    BP 124/80 (BP Location: Right Arm, Patient Position: Sitting, Cuff Size: Normal)   LMP 05/22/2021     General appearance: alert, cooperative and appears stated age  Pelvic: External genitalia:  no lesions              Urethra:  normal appearing urethra with no masses, tenderness or lesions              Bartholins and Skenes: normal                 Vagina: normal appearing vagina with normal color and discharge, no lesions              Cervix: no lesions               The risks of endometrial biopsy were reviewed and a consent was obtained.  A speculum was placed in the vagina and the cervix was cleansed with betadine. A tenaculum was placed on the cervix and the pipelle was placed into the endometrial cavity. The uterus sounded to 8 cm. The endometrial biopsy was performed, taking care to get a representative sample, sampling 360 degrees of the uterine cavity. Minimal tissue was obtained. The  tenaculum and speculum were removed. There were no complications.    Chaperone was present for exam.  1. Abnormal uterine bleeding Normal u/s, CBC, TSH, and pap - Surgical  pathology( [facility]/ [facility]) -as long as her biopsy is negative no further w/u is needed  2. General counseling and advice on female contraception She has decided to stay on OCP's for now.  3. Pre-procedure lab exam - Pregnancy, urine

## 2021-06-14 NOTE — Patient Instructions (Signed)
Endometrial Biopsy Post-procedure Instructions Cramping is common.  You may take Ibuprofen, Aleve, or Tylenol for the cramping.  This should resolve within 24 hours.   You may have a small amount of spotting.  You should wear a mini pad for the next few days. You may have intercourse in 24 hours. You need to call the office if you have any pelvic pain, fever, heavy bleeding, or foul smelling vaginal discharge. Shower or bathe as normal You will be notified within one week of your biopsy results or we will discuss your results at your follow-up appointment if needed.  IUD Post-procedure Instructions Cramping is common.  You may take Ibuprofen, Aleve, or Tylenol for the cramping.  This should resolve within 24 hours.   You may have a small amount of spotting.  You should wear a mini pad for the next few days. You may have intercourse in 24 hours. You need to call the office if you have any pelvic pain, fever, heavy bleeding, or foul smelling vaginal discharge. Shower or bathe as normal

## 2021-06-15 LAB — SURGICAL PATHOLOGY

## 2021-06-25 ENCOUNTER — Other Ambulatory Visit: Payer: Self-pay | Admitting: Obstetrics and Gynecology

## 2021-06-26 ENCOUNTER — Telehealth: Payer: Self-pay | Admitting: *Deleted

## 2021-06-26 NOTE — Telephone Encounter (Signed)
Patient had endometrial biopsy done on 06/14/21, aware normal results. Reports she has been spotting since biopsy, the color changes from red to dark brown, back to red blood again. She is wearing tampons,changing twice daily, no pain or cramping anymore. Reports her cycles are "all over the place" and she is not sure when her next cycle will start and unsure if this her cycle at this point or just from the biopsy. Patient asked if spotting post biopsy is normal at this point? Please advise

## 2021-06-26 NOTE — Telephone Encounter (Signed)
Patient had annual exam on 03/30/21 and endo bx on 06/14/21 and said she would like to stay on pills.

## 2021-06-27 NOTE — Telephone Encounter (Signed)
Patient informed with below note. 

## 2021-06-27 NOTE — Telephone Encounter (Signed)
We did the biopsy because of abnormal bleeding on OCP's. She has had a completely negative w/u. I would anticipate any bleeding from the biopsy should be done by now. I would just keep taking her pills at the same time daily and hang in there.

## 2021-07-05 ENCOUNTER — Encounter: Payer: Self-pay | Admitting: Obstetrics and Gynecology

## 2021-10-18 ENCOUNTER — Telehealth: Payer: Self-pay

## 2021-10-18 MED ORDER — NORETHIN ACE-ETH ESTRAD-FE 1-20 MG-MCG PO TABS: 1.0000 | ORAL_TABLET | Freq: Every day | ORAL | 0 refills | Status: DC

## 2021-10-18 NOTE — Telephone Encounter (Signed)
Patient called and states last month she picked up her Rx for bcp for 3 packs. She has taken a pack but cannot find the other 2 packs. She said her insurance will not pay for her to get them again now and she is asking if we will send Rx for additional 2 packs separate of her regular Rx so that she can pick them up and pay out of pocket for them.

## 2022-02-27 ENCOUNTER — Other Ambulatory Visit: Payer: Self-pay | Admitting: *Deleted

## 2022-02-27 ENCOUNTER — Other Ambulatory Visit: Payer: Self-pay | Admitting: Obstetrics and Gynecology

## 2022-02-27 MED ORDER — NORETHIN ACE-ETH ESTRAD-FE 1-20 MG-MCG PO TABS: 1.0000 | ORAL_TABLET | Freq: Every day | ORAL | 0 refills | Status: DC

## 2022-02-27 NOTE — Telephone Encounter (Signed)
Patient called requesting refill on birth control pills, Rx was denied. Message sent to appointments to call and schedule annual exam.  ?

## 2022-02-27 NOTE — Telephone Encounter (Signed)
Patient scheduled on 05/02/22 for annual exam. Called requesting refill on BCP for Junel FE 1/20 mg tablet. Please advise  ?

## 2022-04-25 NOTE — Progress Notes (Deleted)
51 y.o. [id number] Legally Separated White or Caucasian Not Hispanic or Latino female here for annual exam.      No LMP recorded.          Sexually active: {yes no:314532}  The current method of family planning is {contraception:315051}.    Exercising: {yes no:314532}  {types:19826} Smoker:  {YES [id number]  Health Maintenance: Pap:  03/30/21 WNL HR HPV Neg,  04/13/2018 WNL NEG HPV History of abnormal Pap:  yes colpo in 2008  MMG:  07/15/19 density b bi-rads 1 neg  BMD:   never  Colonoscopy: 2020 inflammation of the ileum. *** TDaP:  2014  Gardasil: N/A   reports that she quit smoking about 21 months ago. Her smoking use included cigarettes. She has a 15.00 pack-year smoking history. She has never used smokeless tobacco. She reports current alcohol use of about 2.0 - 3.0 standard drinks per week. She reports that she does not use drugs.  Past Medical History:  Diagnosis Date   Abnormal uterine bleeding    Allergy    Anxiety    Arthritis    Asthma    Depression    Fibroid    GERD (gastroesophageal reflux disease)    tums prn   H/O degenerative disc disease    neck   Heart murmur    PONV (postoperative nausea and vomiting)    Rotator cuff tear    right side   Ruptured disk    Urinary incontinence     Past Surgical History:  Procedure Laterality Date   ANOPLASTY     APPENDECTOMY     COLPOSCOPY     DIAGNOSTIC LAPAROSCOPY     x3 for endometriosis   GYNECOLOGIC CRYOSURGERY     KNEE ARTHROSCOPY     right-x3   LAPAROSCOPY  03/26/2012   Procedure: LAPAROSCOPY OPERATIVE;  Surgeon: Lovenia Kim, MD;  Location: [facility];  Service: Gynecology;  Laterality: N/A;  with ablation of endometriosis    left hand surgery  03/11/12   MOUTH SURGERY     PELVIC LAPAROSCOPY     SHOULDER ARTHROSCOPY  right   TENDON EXPLORATION  03/11/2012   Procedure: TENDON EXPLORATION;  Surgeon: Wynonia Sours, MD;  Location: [facility];  Service: Orthopedics;  Laterality: Left;  EXPLORATION OF  FLEXOR TENDON LEFT INDEX FINGER       Current Outpatient Medications  Medication Sig Dispense Refill   clonazePAM (KLONOPIN) 1 MG tablet Take 1 mg by mouth at bedtime. Take 1/2 tablet at bedtime.     diclofenac Sodium (VOLTAREN) 1 % GEL Apply topically. (Patient not taking: Reported on 06/14/2021)     lisdexamfetamine (VYVANSE) 50 MG capsule Take 50 mg by mouth daily.     norethindrone-ethinyl estradiol-FE (JUNEL FE 1/20) 1-20 MG-MCG tablet Take 1 tablet by mouth daily. 84 tablet 0   ondansetron (ZOFRAN) 4 MG tablet Take 4 mg by mouth 3 (three) times daily as needed.     pregabalin (LYRICA) 75 MG capsule Take 75 mg by mouth 2 (two) times daily.     traMADol (ULTRAM) 50 MG tablet Take 1-2 tablets (50-100 mg total) by mouth every 6 (six) hours as needed. 120 tablet 1   traZODone (DESYREL) 50 MG tablet Take 50-100 mg by mouth at bedtime.     VITAMIN D PO Take 50,000 Int'l Units/1.37m by mouth once a week.     No current facility-administered medications for this visit.    Family History  Problem Relation Age of Onset  Heart disease Mother    Diabetes Mother    Atrial fibrillation Mother    Diabetes Father    Hyperlipidemia Father    Asthma Sister    Heart disease Daughter    Heart disease Brother    Stroke Maternal Grandmother    Dementia Paternal Grandmother     Review of Systems  Exam:   There were no vitals taken for this visit.  Weight change: '@WEIGHTCHANGE'$ @ Height:      Ht Readings from Last 3 Encounters:  05/03/21 '5\' 9"'$  (1.753 m)  03/30/21 '5\' 9"'$  (1.753 m)  09/27/20 '5\' 9"'$  (1.753 m)    General appearance: alert, cooperative and appears stated age Head: Normocephalic, without obvious abnormality, atraumatic Neck: no adenopathy, supple, symmetrical, trachea midline and thyroid {CHL AMB PHY EX THYROID NORM DEFAULT:[phone]::"normal to inspection and palpation"} Lungs: clear to auscultation bilaterally Cardiovascular: regular rate and rhythm Breasts: {Exam;  breast:13139::"normal appearance, no masses or tenderness"} Abdomen: soft, non-tender; non distended,  no masses,  no organomegaly Extremities: extremities normal, atraumatic, no cyanosis or edema Skin: Skin color, texture, turgor normal. No rashes or lesions Lymph nodes: Cervical, supraclavicular, and axillary nodes normal. No abnormal inguinal nodes palpated Neurologic: Grossly normal   Pelvic: External genitalia:  no lesions              Urethra:  normal appearing urethra with no masses, tenderness or lesions              Bartholins and Skenes: normal                 Vagina: normal appearing vagina with normal color and discharge, no lesions              Cervix: {CHL AMB PHY EX CERVIX NORM DEFAULT:[phone]::"no lesions"}               Bimanual Exam:  Uterus:  {CHL AMB PHY EX UTERUS NORM DEFAULT:[phone]::"normal size, contour, position, consistency, mobility, non-tender"}              Adnexa: {CHL AMB PHY EX ADNEXA NO MASS DEFAULT:[phone]::"no mass, fullness, tenderness"}               Rectovaginal: Confirms               Anus:  normal sphincter tone, no lesions  *** chaperoned for the exam.  A:  Well Woman with normal exam  P:

## 2022-04-30 ENCOUNTER — Telehealth: Payer: Self-pay | Admitting: *Deleted

## 2022-04-30 NOTE — Telephone Encounter (Signed)
Patient informed. She will report to ER.  ?

## 2022-04-30 NOTE — Telephone Encounter (Signed)
I'm concerned with the fever, nausea and diarrhea that this could be GI and not GYN (ie diverticulitis). She is on OCP's and shouldn't be ovulating. I would recommend that she be seen in the ER so she can get lab work and imaging. I don't think she should wait.  ? ?

## 2022-04-30 NOTE — Telephone Encounter (Signed)
Patient called c/o abdomen pain on Sunday night at 11:00pm along with diarrhea and nausea the pain subsided. Yesterday she said she felt horrible fever of 102 with left side ovary pain, she took Advil her fever went away. Patient said the left side pain is constant,hurts when walking, sitting, standing, sneezing etc. On Sunday pain was a level 8 out 10, now when I spoke with her she was sitting in her car, it was 3 out of 10. LMP:03/31/22 was only spotting and still spotting now, her annual exam is scheduled on 05/02/22. Patient asked if she could be sooner due to the pain? I did offer her visit tomorrow at 2:30pm slot to address problem, however patient declined said something is wrong and I want to be seen sooner, (I have tomorrow slot held ) I did explain if she could go to ER if pain becomes 8 again. Please advise  ?

## 2022-05-02 ENCOUNTER — Ambulatory Visit: Payer: Medicaid Other | Admitting: Obstetrics and Gynecology

## 2022-05-15 NOTE — Progress Notes (Signed)
51 y.o. [id number] Legally Separated White or Caucasian Not Hispanic or Latino female here for annual exam. She has a long h/o persistnent AUB on low dose OCP's. She had a normal pap, CBC, TSH, U/S and endometrial biopsy last year.  Typically was getting a cycle monthly, light for 10-14 days. Skipped her cycle in 2/23. Got a cycle on 3/11 for 3 days, then 3/26 spotting started, got heavier on 03/31/22 used 2-3 super + tampons a day. Total bleeding lasted about a month.  She takes her pills cyclicly, occasionally misses a pill.  Most of the bleeding is spotting. No longer having heavy cycles.   She has started having night sweats most nights. No hot flashes during the day. She is currently on the placebo week of pills, due to restart in 2 days.    She has a h/o endometriosis, has had 4 laparoscopies.   She reports dyspareunia even with lube, feels vaginal dryness baseline. Occasional deep dyspareunia. Low libido.   She c/o continued urinary incontinence today, would like to get back into PT. She has mixed incontinence, worsening. Leaks several x a day. She has increased urinary urgency and frequency, no dysuria. GSI > Urge  Normal bowel function.  Recently diagnosed with HTN, on medication.     No LMP recorded.          Sexually active: Yes.    The current method of family planning is OCP (estrogen/progesterone).    Exercising: No.   Nothing extra, but is constantly moving/walking with work.  Smoker:  no  Health Maintenance: Pap:  03/30/21 WNL HR HPV-neg, 04/13/2018 WNL NEG HPV History of abnormal Pap:  yes Colpo 2008  MMG:  07/15/19 Bi-rads 1 neg, aware that she is overdue BMD:   none  Colonoscopy: 2022-need to f/u in 5 years per pt TDaP:  2014  Gardasil: none    reports that she quit smoking about 22 months ago. Her smoking use included cigarettes. She has a 15.00 pack-year smoking history. She has never used smokeless tobacco. She reports that she does not currently use alcohol. She  reports current drug use. Drug: Marijuana. Just occasional ETOH. She is working as Building control surveyor.   Past Medical History:  Diagnosis Date   Abnormal uterine bleeding    Allergy    Anxiety    Arthritis    Asthma    Depression    Diverticulitis    Fibroid    GERD (gastroesophageal reflux disease)    tums prn   H/O degenerative disc disease    neck   Heart murmur    Hypertension    PONV (postoperative nausea and vomiting)    Rotator cuff tear    right side   Ruptured disk    Urinary incontinence     Past Surgical History:  Procedure Laterality Date   ANOPLASTY     APPENDECTOMY     COLPOSCOPY     DIAGNOSTIC LAPAROSCOPY     x3 for endometriosis   GYNECOLOGIC CRYOSURGERY     KNEE ARTHROSCOPY     right-x3   LAPAROSCOPY  03/26/2012   Procedure: LAPAROSCOPY OPERATIVE;  Surgeon: Lovenia Kim, MD;  Location: [facility];  Service: Gynecology;  Laterality: N/A;  with ablation of endometriosis    left hand surgery  03/11/12   MOUTH SURGERY     PELVIC LAPAROSCOPY     SHOULDER ARTHROSCOPY  right   TENDON EXPLORATION  03/11/2012   Procedure: TENDON EXPLORATION;  Surgeon: Wynonia Sours, MD;  Location: [facility];  Service: Orthopedics;  Laterality: Left;  EXPLORATION OF FLEXOR TENDON LEFT INDEX FINGER       Current Outpatient Medications  Medication Sig Dispense Refill   clonazePAM (KLONOPIN) 1 MG tablet Take 1 mg by mouth at bedtime. Take 1/2 tablet at bedtime.     lisinopril (ZESTRIL) 10 MG tablet Take 10 mg by mouth daily.     methylphenidate 27 MG PO CR tablet Take by mouth.     naproxen (NAPROSYN) 500 MG tablet Take by mouth.     norethindrone-ethinyl estradiol-FE (JUNEL FE 1/20) 1-20 MG-MCG tablet Take 1 tablet by mouth daily. 84 tablet 0   omeprazole (PRILOSEC) 40 MG capsule Take by mouth.     ondansetron (ZOFRAN) 4 MG tablet Take 4 mg by mouth 3 (three) times daily as needed.     predniSONE (DELTASONE) 20 MG tablet Take by mouth.     pregabalin (LYRICA) 75 MG  capsule Take 75 mg by mouth 2 (two) times daily.     promethazine-dextromethorphan (PROMETHAZINE-DM) 6.25-15 MG/5ML syrup Take 5 mLs by mouth every 6 (six) hours as needed.     traMADol (ULTRAM) 50 MG tablet Take 1-2 tablets (50-100 mg total) by mouth every 6 (six) hours as needed. 120 tablet 1   traZODone (DESYREL) 50 MG tablet Take 50-100 mg by mouth at bedtime.     VITAMIN D PO Take 14,000 Int'l Units by mouth once a week.     No current facility-administered medications for this visit.    Family History  Problem Relation Age of Onset   Heart disease Mother    Diabetes Mother    Atrial fibrillation Mother    Diabetes Father    Hyperlipidemia Father    Asthma Sister    Heart disease Daughter    Heart disease Brother    Stroke Maternal Grandmother    Dementia Paternal Grandmother     Review of Systems  Genitourinary:  Positive for dyspareunia.  All other systems reviewed and are negative.  Exam:   BP (!) 148/98 (BP Location: Left Arm, Patient Position: Sitting, Cuff Size: Normal) Comment: 152/96 Lt Arm, Sitting, Normal Cuff  Pulse (!) 118   Ht 5' 9.25" (1.759 m)   Wt 145 lb (65.8 kg)   SpO2 97%   BMI 21.26 kg/m   Weight change: '@WEIGHTCHANGE'$ @ Height:   Height: 5' 9.25" (175.9 cm)  Ht Readings from Last 3 Encounters:  05/28/22 5' 9.25" (1.759 m)  05/03/21 '5\' 9"'$  (1.753 m)  03/30/21 '5\' 9"'$  (1.753 m)    General appearance: alert, cooperative and appears stated age Head: Normocephalic, without obvious abnormality, atraumatic Neck: no adenopathy, supple, symmetrical, trachea midline and thyroid normal to inspection and palpation Lungs: clear to auscultation bilaterally Cardiovascular: regular rate and rhythm Breasts: normal appearance, no masses or tenderness Abdomen: soft, non-tender; non distended,  no masses,  no organomegaly Extremities: extremities normal, atraumatic, no cyanosis or edema Skin: Skin color, texture, turgor normal. No rashes or lesions Lymph nodes:  Cervical, supraclavicular, and axillary nodes normal. No abnormal inguinal nodes palpated Neurologic: Grossly normal   Pelvic: External genitalia:  no lesions              Urethra:  normal appearing urethra with no masses, tenderness or lesions              Bartholins and Skenes: normal                 Vagina: atrophic appearing vagina  with normal color and discharge, no lesions              Cervix: no lesions               Bimanual Exam:  Uterus:  normal size, contour, position, consistency, mobility, non-tender              Adnexa: no mass, fullness, tenderness               Rectovaginal: Confirms               Anus:  normal sphincter tone, no lesions  Lovena Le, CMA chaperoned for the exam.  1. Well woman exam No pap this year Mammogram overdue, # given Labs with primary  2. Breakthrough bleeding on birth control pills Previously with negative w/u Recently diagnosed with HTN, can't stay on OCP's  3. Night sweats Suspect she is at least perimenopausal - Follicle stimulating hormone  4. Mixed incontinence - Urinalysis,Complete w/RFL Culture - Urine Culture -She will reach out when she is ready to restart PT  5. History of hypertension Has to go off OCP's  6. Vaginal atrophy - estradiol (ESTRACE) 0.1 MG/GM vaginal cream; 1 gram vaginally q hs x 1 week, then space out to twice weekly  Dispense: 42.5 g; Refill: 1  7. Dyspareunia, female Will start vaginal estrogen, discussed vaginal lubrication  8. General counseling and advice on female contraception Needs to stop OCP's If her [facility] is low will restart her on POP

## 2022-05-28 ENCOUNTER — Ambulatory Visit (INDEPENDENT_AMBULATORY_CARE_PROVIDER_SITE_OTHER): Payer: Medicaid Other | Admitting: Obstetrics and Gynecology

## 2022-05-28 ENCOUNTER — Encounter: Payer: Self-pay | Admitting: Obstetrics and Gynecology

## 2022-05-28 VITALS — BP 148/98 | HR 118 | Ht 69.25 in | Wt 145.0 lb

## 2022-05-28 DIAGNOSIS — N952 Postmenopausal atrophic vaginitis: Secondary | ICD-10-CM

## 2022-05-28 DIAGNOSIS — N3946 Mixed incontinence: Secondary | ICD-10-CM | POA: Diagnosis not present

## 2022-05-28 DIAGNOSIS — Z01419 Encounter for gynecological examination (general) (routine) without abnormal findings: Secondary | ICD-10-CM

## 2022-05-28 DIAGNOSIS — N941 Unspecified dyspareunia: Secondary | ICD-10-CM

## 2022-05-28 DIAGNOSIS — Z3009 Encounter for other general counseling and advice on contraception: Secondary | ICD-10-CM

## 2022-05-28 DIAGNOSIS — R61 Generalized hyperhidrosis: Secondary | ICD-10-CM | POA: Diagnosis not present

## 2022-05-28 DIAGNOSIS — N921 Excessive and frequent menstruation with irregular cycle: Secondary | ICD-10-CM

## 2022-05-28 DIAGNOSIS — Z8679 Personal history of other diseases of the circulatory system: Secondary | ICD-10-CM

## 2022-05-28 LAB — URINALYSIS, COMPLETE W/RFL CULTURE
Bacteria, UA: NONE SEEN /HPF
Bilirubin Urine: NEGATIVE
Casts: NONE SEEN /LPF
Crystals: NONE SEEN /HPF
Hgb urine dipstick: NEGATIVE
Hyaline Cast: NONE SEEN /LPF
Ketones, ur: NEGATIVE
Leukocyte Esterase: NEGATIVE
Nitrites, Initial: NEGATIVE
Protein, ur: NEGATIVE
RBC / HPF: NONE SEEN /HPF (ref 0–2)
Specific Gravity, Urine: 1.02 (ref 1.001–1.035)
WBC, UA: NONE SEEN /HPF (ref 0–5)
Yeast: NONE SEEN /HPF
pH: 5 (ref 5.0–8.0)

## 2022-05-28 LAB — NO CULTURE INDICATED

## 2022-05-28 MED ORDER — ESTRADIOL 0.1 MG/GM VA CREA: TOPICAL_CREAM | VAGINAL | 1 refills | Status: DC

## 2022-05-28 NOTE — Patient Instructions (Signed)
EXERCISE   We recommended that you start or continue a regular exercise program for good health. Physical activity is anything that gets your body moving, some is better than none. The CDC recommends 150 minutes per week of Moderate-Intensity Aerobic Activity and 2 or more days of Muscle Strengthening Activity.  Benefits of exercise are limitless: helps weight loss/weight maintenance, improves mood and energy, helps with depression and anxiety, improves sleep, tones and strengthens muscles, improves balance, improves bone density, protects from chronic conditions such as heart disease, high blood pressure and diabetes and so much more. To learn more visit: https://www.cdc.gov/physicalactivity/index.html  DIET: Good nutrition starts with a healthy diet of fruits, vegetables, whole grains, and lean protein sources. Drink plenty of water for hydration. Minimize empty calories, sodium, sweets. For more information about dietary recommendations visit: https://health.gov/our-work/nutrition-physical-activity/dietary-guidelines and https://www.myplate.gov/  ALCOHOL:  Women should limit their alcohol intake to no more than 7 drinks/beers/glasses of wine (combined, not each!) per week. Moderation of alcohol intake to this level decreases your risk of breast cancer and liver damage.  If you are concerned that you may have a problem, or your friends have told you they are concerned about your drinking, there are many resources to help. A well-known program that is free, effective, and available to all people all over the nation is Alcoholics Anonymous.  Check out this site to learn more: https://www.aa.org/   CALCIUM AND VITAMIN D:  Adequate intake of calcium and Vitamin D are recommended for bone health.  You should be getting between 1000-1200 mg of calcium and 800 units of Vitamin D daily between diet and supplements  PAP SMEARS:  Pap smears, to check for cervical cancer or precancers,  have traditionally been  done yearly, scientific advances have shown that most women can have pap smears less often.  However, every woman still should have a physical exam from her gynecologist every year. It will include a breast check, inspection of the vulva and vagina to check for abnormal growths or skin changes, a visual exam of the cervix, and then an exam to evaluate the size and shape of the uterus and ovaries. We will also provide age appropriate advice regarding health maintenance, like when you should have certain vaccines, screening for sexually transmitted diseases, bone density testing, colonoscopy, mammograms, etc.   MAMMOGRAMS:  All women over 40 years old should have a routine mammogram.   COLON CANCER SCREENING: Now recommend starting at age 45. At this time colonoscopy is not covered for routine screening until 50. There are take home tests that can be done between 45-49.   COLONOSCOPY:  Colonoscopy to screen for colon cancer is recommended for all women at age 50.  We know, you hate the idea of the prep.  We agree, BUT, having colon cancer and not knowing it is worse!!  Colon cancer so often starts as a polyp that can be seen and removed at colonscopy, which can quite literally save your life!  And if your first colonoscopy is normal and you have no family history of colon cancer, most women don't have to have it again for 10 years.  Once every ten years, you can do something that may end up saving your life, right?  We will be happy to help you get it scheduled when you are ready.  Be sure to check your insurance coverage so you understand how much it will cost.  It may be covered as a preventative service at no cost, but you should check   your particular policy.      Breast Self-Awareness Breast self-awareness means being familiar with how your breasts look and feel. It involves checking your breasts regularly and reporting any changes to your health care provider. Practicing breast self-awareness is  important. A change in your breasts can be a sign of a serious medical problem. Being familiar with how your breasts look and feel allows you to find any problems early, when treatment is more likely to be successful. All women should practice breast self-awareness, including women who have had breast implants. How to do a breast self-exam One way to learn what is normal for your breasts and whether your breasts are changing is to do a breast self-exam. To do a breast self-exam: Look for Changes  Remove all the clothing above your waist. Stand in front of a mirror in a room with good lighting. Put your hands on your hips. Push your hands firmly downward. Compare your breasts in the mirror. Look for differences between them (asymmetry), such as: Differences in shape. Differences in size. Puckers, dips, and bumps in one breast and not the other. Look at each breast for changes in your skin, such as: Redness. Scaly areas. Look for changes in your nipples, such as: Discharge. Bleeding. Dimpling. Redness. A change in position. Feel for Changes Carefully feel your breasts for lumps and changes. It is best to do this while lying on your back on the floor and again while sitting or standing in the shower or tub with soapy water on your skin. Feel each breast in the following way: Place the arm on the side of the breast you are examining above your head. Feel your breast with the other hand. Start in the nipple area and make  inch (2 cm) overlapping circles to feel your breast. Use the pads of your three middle fingers to do this. Apply light pressure, then medium pressure, then firm pressure. The light pressure will allow you to feel the tissue closest to the skin. The medium pressure will allow you to feel the tissue that is a little deeper. The firm pressure will allow you to feel the tissue close to the ribs. Continue the overlapping circles, moving downward over the breast until you feel your  ribs below your breast. Move one finger-width toward the center of the body. Continue to use the  inch (2 cm) overlapping circles to feel your breast as you move slowly up toward your collarbone. Continue the up and down exam using all three pressures until you reach your armpit.  Write Down What You Find  Write down what is normal for each breast and any changes that you find. Keep a written record with breast changes or normal findings for each breast. By writing this information down, you do not need to depend only on memory for size, tenderness, or location. Write down where you are in your menstrual cycle, if you are still menstruating. If you are having trouble noticing differences in your breasts, do not get discouraged. With time you will become more familiar with the variations in your breasts and more comfortable with the exam. How often should I examine my breasts? Examine your breasts every month. If you are breastfeeding, the best time to examine your breasts is after a feeding or after using a breast pump. If you menstruate, the best time to examine your breasts is 5-7 days after your period is over. During your period, your breasts are lumpier, and it may be more   difficult to notice changes. When should I see my health care provider? See your health care provider if you notice: A change in shape or size of your breasts or nipples. A change in the skin of your breast or nipples, such as a reddened or scaly area. Unusual discharge from your nipples. A lump or thick area that was not there before. Pain in your breasts. Anything that concerns you. Kegel Exercises  Kegel exercises can help strengthen your pelvic floor muscles. The pelvic floor is a group of muscles that support your rectum, small intestine, and bladder. In females, pelvic floor muscles also help support the uterus. These muscles help you control the flow of urine and stool (feces). Kegel exercises are painless and  simple. They do not require any equipment. Your provider may suggest Kegel exercises to: Improve bladder and bowel control. Improve sexual response. Improve weak pelvic floor muscles after surgery to remove the uterus (hysterectomy) or after pregnancy, in females. Improve weak pelvic floor muscles after prostate gland removal or surgery, in males. Kegel exercises involve squeezing your pelvic floor muscles. These are the same muscles you squeeze when you try to stop the flow of urine or keep from passing gas. The exercises can be done while sitting, standing, or lying down, but it is best to vary your position. Ask your health care provider which exercises are safe for you. Do exercises exactly as told by your health care provider and adjust them as directed. Do not begin these exercises until told by your health care provider. Exercises How to do Kegel exercises: Squeeze your pelvic floor muscles tight. You should feel a tight lift in your rectal area. If you are a female, you should also feel a tightness in your vaginal area. Keep your stomach, buttocks, and legs relaxed. Hold the muscles tight for up to 10 seconds. Breathe normally. Relax your muscles for up to 10 seconds. Repeat as told by your health care provider. Repeat this exercise daily as told by your health care provider. Continue to do this exercise for at least 4-6 weeks, or for as long as told by your health care provider. You may be referred to a physical therapist who can help you learn more about how to do Kegel exercises. Depending on your condition, your health care provider may recommend: Varying how long you squeeze your muscles. Doing several sets of exercises every day. Doing exercises for several weeks. Making Kegel exercises a part of your regular exercise routine. This information is not intended to replace advice given to you by your health care provider. Make sure you discuss any questions you have with your health  care provider. Document Revised: 04/19/2021 Document Reviewed: 04/19/2021 Elsevier Patient Education  [place]. Urinary Incontinence Urinary incontinence refers to a condition in which a person is unable to control where and when to pass urine. A person with this condition will urinate involuntarily. This means that the person urinates when he or she does not mean to. What are the causes? This condition may be caused by: Medicines. Infections. Constipation. Overactive bladder muscles. Weak bladder muscles. Weak pelvic floor muscles. These muscles provide support for the bladder, intestine, and, in women, the uterus. Enlarged prostate in men. The prostate is a gland near the bladder. When it gets too big, it can pinch the urethra. With the urethra blocked, the bladder can weaken and lose the ability to empty properly. Surgery. Emotional factors, such as anxiety, stress, or post-traumatic stress disorder (PTSD). Spinal  cord injury, nerve injury, or other neurological conditions. Pelvic organ prolapse. This happens in women when organs move out of place and into the vagina. This movement can prevent the bladder and urethra from working properly. What increases the risk? The following factors may make you more likely to develop this condition: Age. The older you are, the higher the risk. Obesity. Being physically inactive. Pregnancy and childbirth. Menopause. Diseases that affect the nerves or spinal cord. Long-term, or chronic, coughing. This can increase pressure on the bladder and pelvic floor muscles. What are the signs or symptoms? Symptoms may vary depending on the type of urinary incontinence you have. They include: A sudden urge to urinate, and passing urine involuntarily before you can get to a bathroom (urge incontinence). Suddenly passing urine when doing activities that force urine to pass, such as coughing, laughing, exercising, or sneezing (stress  incontinence). Needing to urinate often but urinating only a small amount, or constantly dribbling urine (overflow incontinence). Urinating because you cannot get to the bathroom in time due to a physical disability, such as arthritis or injury, or due to a communication or thinking problem, such as Alzheimer's disease (functional incontinence). How is this diagnosed? This condition may be diagnosed based on: Your medical history. A physical exam. Tests, such as: Urine tests. X-rays of your kidney and bladder. Ultrasound. CT scan. Cystoscopy. In this procedure, a health care provider inserts a tube with a light and camera (cystoscope) through the urethra and into the bladder to check for problems. Urodynamic testing. These tests assess how well the bladder, urethra, and sphincter can store and release urine. There are different types of urodynamic tests, and they vary depending on what the test is measuring. To help diagnose your condition, your health care provider may recommend that you keep a log of when you urinate and how much you urinate. How is this treated? Treatment for this condition depends on the type of incontinence that you have and its cause. Treatment may include: Lifestyle changes, such as: Quitting smoking. Maintaining a healthy weight. Staying active. Try to get 150 minutes of moderate-intensity exercise every week. Ask your health care provider which activities are safe for you. Eating a healthy diet. Avoid high-fat foods, like fried foods. Avoid refined carbohydrates like white bread and white rice. Limit how much alcohol and caffeine you drink. Increase your fiber intake. Healthy sources of fiber include beans, whole grains, and fresh fruits and vegetables. Behavioral changes, such as: Pelvic floor muscle exercises. Bladder training, such as lengthening the amount of time between bathroom breaks, or using the bathroom at regular intervals. Using techniques to  suppress bladder urges. This can include distraction techniques or controlled breathing exercises. Medicines, such as: Medicines to relax the bladder muscles and prevent bladder spasms. Medicines to help slow or prevent the growth of a man's prostate. Botox injections. These can help relax the bladder muscles. Treatments, such as: Using pulses of electricity to help change bladder reflexes (electrical nerve stimulation). For women, using a medical device to prevent urine leaks. This is a small, tampon-like, disposable device that is inserted into the urethra. Injecting collagen or carbon beads (bulking agents) into the urinary sphincter. These can help thicken tissue and close the bladder opening. Surgery. Follow these instructions at home: Lifestyle Limit alcohol and caffeine. These can fill your bladder quickly and irritate it. Keep yourself clean to help prevent odors and skin damage. Ask your health care provider about special skin creams and cleansers that can protect the  skin from urine. Consider wearing pads or adult diapers. Make sure to change them regularly, and always change them right after experiencing incontinence. General instructions Take over-the-counter and prescription medicines only as told by your health care provider. Use the bathroom about every 3-4 hours, even if you do not feel the need to urinate. Try to empty your bladder completely every time. After urinating, wait a minute. Then try to urinate again. Make sure you are in a relaxed position while urinating. If your incontinence is caused by nerve problems, keep a log of the medicines you take and the times you go to the bathroom. Keep all follow-up visits. This is important. Where to find more information [employer] of Diabetes and Digestive and Kidney Diseases: [employer] American Urology Association: www.urologyhealth.org Contact a health care provider if: You have pain that gets worse. Your  incontinence gets worse. Get help right away if: You have a fever or chills. You are unable to urinate. You have redness in your groin area or down your legs. Summary Urinary incontinence refers to a condition in which a person is unable to control where and when to pass urine. This condition may be caused by medicines, infection, weak bladder muscles, weak pelvic floor muscles, enlargement of the prostate (in men), or surgery. Factors such as older age, obesity, pregnancy and childbirth, menopause, neurological diseases, and chronic coughing may increase your risk for developing this condition. Types of urinary incontinence include urge incontinence, stress incontinence, overflow incontinence, and functional incontinence. This condition is usually treated first with lifestyle and behavioral changes, such as quitting smoking, eating a healthier diet, and doing regular pelvic floor exercises. Other treatment options include medicines, bulking agents, medical devices, electrical nerve stimulation, or surgery. This information is not intended to replace advice given to you by your health care provider. Make sure you discuss any questions you have with your health care provider. Document Revised: 07/14/2020 Document Reviewed: 07/14/2020 Elsevier Patient Education  [place].

## 2022-05-29 ENCOUNTER — Telehealth: Payer: Self-pay | Admitting: *Deleted

## 2022-05-29 ENCOUNTER — Telehealth: Payer: Self-pay

## 2022-05-29 ENCOUNTER — Other Ambulatory Visit: Payer: Self-pay

## 2022-05-29 DIAGNOSIS — Z3041 Encounter for surveillance of contraceptive pills: Secondary | ICD-10-CM

## 2022-05-29 LAB — URINE CULTURE
MICRO NUMBER:: 13489266
Result:: NO GROWTH
SPECIMEN QUALITY:: ADEQUATE

## 2022-05-29 LAB — FOLLICLE STIMULATING HORMONE: FSH: 24.3 m[IU]/mL

## 2022-05-29 MED ORDER — NORETHINDRONE 0.35 MG PO TABS: 1.0000 | ORAL_TABLET | Freq: Every day | ORAL | 3 refills | Status: DC

## 2022-05-29 NOTE — Telephone Encounter (Signed)
Addendum to last msg due to speaking with pt re: lab result she confirm that her cycle/spotting had ended a couple days before exam yesterday.

## 2022-05-29 NOTE — Telephone Encounter (Signed)
Patient had annual exam yesterday and forgot to mention a couple of things to you.   Patient reports having discomfort with intercourse (which was 04/20/22)  and bled last time like a period which lasted until the next morning, but then eventually later that day became brown discharge.   She also said since exam yesterday she noticed spotting and mild cramping which is usual for her.    She just wanted to pass this information along.   Please advise

## 2022-05-29 NOTE — Telephone Encounter (Signed)
LDVMOM per DPR. 

## 2022-05-29 NOTE — Telephone Encounter (Signed)
Pt calling to report something she forgot to tell you yesterday at her annual exam. States that with the dyspareunia, there was one time when she experienced vaginal bleeding like a period afterwards that lasted until the next morning (when she wasn't due for reg menses)  Also reports spotting after internal exam yesterday and mild menstrual like cramping this AM. However, per your office notes from yesterday, "due to restart in 2 days."  Please advise.

## 2022-05-29 NOTE — Telephone Encounter (Signed)
It's okay to have some spotting after an exam. If she continues to have entry dyspareunia after is using the vaginal estrogen for a few weeks, she should let me know. If she is having deep pain with intercourse that is persisting, we can set her up for another ultrasound.

## 2022-06-05 ENCOUNTER — Telehealth: Payer: Self-pay

## 2022-06-05 NOTE — Telephone Encounter (Signed)
Patient called back and I informed her with the below directions. Patient has questions about the numbers on the applicator. I explained to patient I have not seen the applicator in person,but numbers should be located on it. If any further questions check with pharmacy.

## 2022-06-05 NOTE — Telephone Encounter (Signed)
Patient called to clarify the directions for her Estrace vaginal cream.  Per Dr. Gentry Fitz 05/28/22 office note "6. Vaginal atrophy - estradiol (ESTRACE) 0.1 MG/GM vaginal cream; 1 gram vaginally q hs x 1 week, then space out to twice weekly  Dispense: 42.5 g; Refill: 1"  Called patient and at her request I reviewed the directions with her in her voice mail. I asked her to call me back if any questions and left direct phone number.

## 2022-08-19 ENCOUNTER — Telehealth: Payer: Self-pay | Admitting: *Deleted

## 2022-08-19 NOTE — Telephone Encounter (Signed)
Yes, she should just restart them. She should use back up contraception for 48 hours. If there is any chance of pregnancy, she should check a UPT in 2 weeks.

## 2022-08-19 NOTE — Telephone Encounter (Signed)
Patient called missed 5 days of OCP's reports she forgot to take them. Reports she had spotting on 08/16/22, only has monthly spotting that last maybe 2 days with Ortho Micronor 0.35 pills. She asked if okay to just start pills? Please advise

## 2022-08-19 NOTE — Telephone Encounter (Signed)
Patient informed. 

## 2022-11-25 ENCOUNTER — Encounter: Payer: Self-pay | Admitting: Obstetrics and Gynecology

## 2023-03-29 ENCOUNTER — Other Ambulatory Visit: Payer: Self-pay | Admitting: Obstetrics and Gynecology

## 2023-03-29 DIAGNOSIS — Z3041 Encounter for surveillance of contraceptive pills: Secondary | ICD-10-CM

## 2023-03-31 NOTE — Telephone Encounter (Signed)
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 Needs annual exam for future refills  Routing to covering provider, Elmarie Shiley, NP - Refill authorized: Please Advise?

## 2023-05-14 ENCOUNTER — Other Ambulatory Visit: Payer: Self-pay | Admitting: Obstetrics and Gynecology

## 2023-05-15 ENCOUNTER — Telehealth: Payer: Self-pay

## 2023-05-15 NOTE — Telephone Encounter (Signed)
Pt LVM in triage line stating she needs rx refill on [place] and already has appt scheduled.

## 2023-05-16 NOTE — Telephone Encounter (Signed)
Call returned to patient, left detailed message, ok per dpr. Advised message received, last RX sent for POP norethindrone on 03/31/23 to [facility] [address] for 3 month supply. F/u with pharmacy for filling if you have not already. If you are not taking this medication, return call to office at [phone], option 4.

## 2023-05-21 NOTE — Telephone Encounter (Signed)
No returned call received, will route to provider and close.

## 2023-06-20 ENCOUNTER — Other Ambulatory Visit: Payer: Self-pay | Admitting: Nurse Practitioner

## 2023-06-20 DIAGNOSIS — Z3041 Encounter for surveillance of contraceptive pills: Secondary | ICD-10-CM

## 2023-06-20 NOTE — Telephone Encounter (Signed)
Med refill request: Micronor Last AEX: 05/28/22 Next AEX: 07/15/23 Last MMG (if hormonal med) 11/22/22 Refill authorized: Please Advise, #84, 0 RF

## 2023-07-15 ENCOUNTER — Ambulatory Visit: Payer: Medicaid Other | Admitting: Nurse Practitioner

## 2023-08-30 ENCOUNTER — Other Ambulatory Visit: Payer: Self-pay | Admitting: Obstetrics & Gynecology

## 2023-08-30 DIAGNOSIS — Z3041 Encounter for surveillance of contraceptive pills: Secondary | ICD-10-CM

## 2023-09-02 NOTE — Telephone Encounter (Signed)
Med refill request:Micronor0.35 mg tab Last AEX: 05/28/22 -ML Next AEX: 10/01/23 -TW Last MMG (if hormonal med) Dx Bilateral MMG, BiRads 1 neg  Rx pended #28/0RF  Refill authorized: Please Advise?

## 2023-09-22 ENCOUNTER — Other Ambulatory Visit: Payer: Self-pay | Admitting: Nurse Practitioner

## 2023-09-22 DIAGNOSIS — Z3041 Encounter for surveillance of contraceptive pills: Secondary | ICD-10-CM

## 2023-09-30 NOTE — Progress Notes (Unsigned)
   Patricia Newton 1971/10/12 [id number]   History:  52 y.o. G3P2012 presents for annual exam. Perimenopausal. Switched to POPs last year due to HTN. H/O AUB, cycles are lighter now. 2008 colpo. H/O endometriosis, lap surgery x 4. Using vaginal estrogen for dryness and painful intercourse.   Gynecologic History No LMP recorded.   Contraception/Family planning: {method:5051} Sexually active: ***  Health Maintenance Last Pap: 04/19/2021. Results were: Normal neg HPV, 5-year repeat Last mammogram: 11/22/2022. Results were: Normal Last colonoscopy: 2022, 5-year recall Last Dexa: Not indicated  Past medical history, past surgical history, family history and social history were all reviewed and documented in the EPIC chart.  ROS:  A ROS was performed and pertinent positives and negatives are included.  Exam:  There were no vitals filed for this visit. There is no height or weight on file to calculate BMI. Physical Exam  General appearance:  Normal Thyroid:  Symmetrical, normal in size, without palpable masses or nodularity. Respiratory  Auscultation:  Clear without wheezing or rhonchi Cardiovascular  Auscultation:  Regular rate, without rubs, murmurs or gallops  Edema/varicosities:  Not grossly evident Abdominal  Soft,nontender, without masses, guarding or rebound.  Liver/spleen:  No organomegaly noted  Hernia:  None appreciated  Skin  Inspection:  Grossly normal Breasts: Examined lying and sitting.   Right: Without masses, retractions, nipple discharge or axillary adenopathy.   Left: Without masses, retractions, nipple discharge or axillary adenopathy. Pelvic: External genitalia:  no lesions              Urethra:  normal appearing urethra with no masses, tenderness or lesions              Bartholins and Skenes: normal                 Vagina: normal appearing vagina with normal color and discharge, no lesions              Cervix: no lesions Bimanual Exam:  Uterus:  no masses  or tenderness              Adnexa: no mass, fullness, tenderness              Rectovaginal: Deferred              Anus:  normal, no lesions   Patient informed chaperone available to be present for breast and pelvic exam. Patient has requested no chaperone to be present. Patient has been advised what will be completed during breast and pelvic exam.   Assessment/Plan:  52 y.o. G *** for annual exam.    No follow-ups on file.   Olivia Mackie DNP, 3:50 PM 09/30/2023

## 2023-10-01 ENCOUNTER — Encounter: Payer: Self-pay | Admitting: Nurse Practitioner

## 2023-10-01 ENCOUNTER — Other Ambulatory Visit: Payer: Self-pay | Admitting: Nurse Practitioner

## 2023-10-01 ENCOUNTER — Ambulatory Visit (INDEPENDENT_AMBULATORY_CARE_PROVIDER_SITE_OTHER): Payer: Medicaid Other | Admitting: Nurse Practitioner

## 2023-10-01 VITALS — BP 110/80 | HR 107 | Ht 68.75 in | Wt 171.0 lb

## 2023-10-01 DIAGNOSIS — Z01419 Encounter for gynecological examination (general) (routine) without abnormal findings: Secondary | ICD-10-CM

## 2023-10-01 DIAGNOSIS — Z3041 Encounter for surveillance of contraceptive pills: Secondary | ICD-10-CM | POA: Diagnosis not present

## 2023-10-01 DIAGNOSIS — N952 Postmenopausal atrophic vaginitis: Secondary | ICD-10-CM | POA: Diagnosis not present

## 2023-10-01 DIAGNOSIS — N951 Menopausal and female climacteric states: Secondary | ICD-10-CM

## 2023-10-01 MED ORDER — NORETHINDRONE 0.35 MG PO TABS
1.0000 | ORAL_TABLET | Freq: Every day | ORAL | 3 refills | Status: DC
Start: 2023-10-01 — End: 2024-06-30

## 2023-10-01 MED ORDER — ESTRADIOL 0.1 MG/GM VA CREA: 1.0000 g | TOPICAL_CREAM | VAGINAL | 1 refills | Status: AC

## 2023-10-02 ENCOUNTER — Other Ambulatory Visit: Payer: Self-pay | Admitting: Nurse Practitioner

## 2023-10-02 DIAGNOSIS — N952 Postmenopausal atrophic vaginitis: Secondary | ICD-10-CM

## 2023-10-02 NOTE — Telephone Encounter (Signed)
Inquiry received from pharmacy re: PA required from insurance.   Responded w/ pt going to pay OOP per rx encounter dated 10/01/2023.

## 2023-10-02 NOTE — Telephone Encounter (Signed)
Inquiry received from pharmacy stating that estrace cream not covered by insurance and OOP cost is $100.  Will contact pt to notify her that with goodrx, she can get a tube to last a little more than 5 months if used correctly for ~32.06.   Spoke w/ pt and she was notified and voiced understanding and appreciation for call.  Link to goodrx coupon sent to pt via [employer]. Routing encounter to provider for final review and closing.

## 2024-06-30 ENCOUNTER — Other Ambulatory Visit: Payer: Self-pay | Admitting: Nurse Practitioner

## 2024-06-30 DIAGNOSIS — Z3041 Encounter for surveillance of contraceptive pills: Secondary | ICD-10-CM

## 2024-06-30 NOTE — Telephone Encounter (Signed)
 Med refill request: norethindrone  0.35 mg New Pharmacy Last AEX: 10/01/23 Next AEX: 10/05/24 Last MMG (if hormonal med) 11/22/22 BI-RADS 1 negative Refill authorized: Please Advise?

## 2024-10-04 NOTE — Progress Notes (Deleted)
 Patricia Newton [birth year] [id number]   History:  53 y.o. [id number] presents for annual exam. Perimenopausal. Switched to POPs last year due to HTN. H/O AUB, cycles are lighter now, lasting 2 weeks instead of months. Still occurring monthly. Complains of hot flashes and night sweats that are causing sleep disturbance. Complains of brain fog. 2008 colpo. H/O endometriosis, lap surgery x 4. Prescribed vaginal estrogen last year but thought it was causing hot flashes so she stopped. Having painful intercourse.   Gynecologic History No LMP recorded.   Contraception/Family planning: oral progesterone-only contraceptive Sexually active: Yes  Health Maintenance Last Pap: 04/19/2021. Results were: Normal neg HPV Last mammogram: 11/22/2022. Results were: Normal Last colonoscopy: 2022, 5-year recall Last Dexa: Not indicated  Past medical history, past surgical history, family history and social history were all reviewed and documented in the EPIC chart. Caregiver. 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   ROS:  A ROS was performed and pertinent positives and negatives are included.  Exam:  There were no vitals filed for this visit.  There is no height or weight on file to calculate BMI.   General appearance:  Normal Thyroid:  Symmetrical, normal in size, without palpable masses or nodularity. Respiratory  Auscultation:  Clear without wheezing or rhonchi Cardiovascular  Auscultation:  Regular rate, without rubs, murmurs or gallops  Edema/varicosities:  Not grossly evident Abdominal  Soft,nontender, without masses, guarding or rebound.  Liver/spleen:  No organomegaly noted  Hernia:  None appreciated  Skin  Inspection:  Grossly normal Breasts: Examined lying and sitting.   Right: Without masses, retractions, nipple discharge or axillary adenopathy.   Left: Without masses, retractions, nipple discharge or axillary adenopathy. Pelvic: External genitalia:  no lesions              Urethra:  normal  appearing urethra with no masses, tenderness or lesions              Bartholins and Skenes: normal                 Vagina: normal appearing vagina with normal color and discharge, no lesions              Cervix: no lesions Bimanual Exam:  Uterus:  no masses or tenderness              Adnexa: no mass, fullness, tenderness              Rectovaginal: Deferred              Anus:  normal, no lesions   Patient informed chaperone available to be present for breast and pelvic exam. Patient has requested no chaperone to be present. Patient has been advised what will be completed during breast and pelvic exam.   Assessment/Plan:  53 y.o. [id number] for annual exam.   Well female exam with routine gynecological exam - Education provided on SBEs, importance of preventative screenings, current guidelines, high calcium diet, regular exercise, and multivitamin daily.  Labs with PCP.   Vaginal atrophy - Plan: estradiol  (ESTRACE ) 0.1 MG/GM vaginal cream twice weekly. Reassured that hot flashes likely not related to vaginal estrogen use. Would like to restart.   Encounter for surveillance of contraceptive pills - Plan: norethindrone  (MICRONOR ) 0.35 MG tablet daily. Cycles are lighter and last for shorter amount of time. Helps with PMS symptoms. Refill x 1 year provided.  Perimenopausal vasomotor symptoms - OTC supplements provided, recommend Mag L-threonate daily for brain fog. We discussed perimenopause and what to expect.  Screening for cervical cancer - Normal Pap history.  Will repeat at 5-year interval per guidelines.  Screening for breast cancer - Normal mammogram history.  Continue annual screenings.  Normal breast exam today.  Screening for colon cancer - 2022 colonoscopy. Will repeat at 5-year interval per GI's recommendation.   Screening for osteoporosis - Average risk. Will plan DXA at age 40.   No follow-ups on file.    Patricia DELENA Shutter DNP, 2:53 PM 10/04/2024

## 2024-10-05 ENCOUNTER — Ambulatory Visit: Payer: Medicaid Other | Admitting: Nurse Practitioner

## 2024-10-06 ENCOUNTER — Other Ambulatory Visit: Payer: Self-pay | Admitting: Nurse Practitioner

## 2024-10-06 DIAGNOSIS — Z3041 Encounter for surveillance of contraceptive pills: Secondary | ICD-10-CM

## 2024-10-07 NOTE — Telephone Encounter (Signed)
 Med refill request:norethindrone  (MICRONOR ) Last AEX: 10/01/23  Next AEX: 12/21/24 Last MMG (if hormonal med) Refill authorized: Last rx 06/30/24 #[id number] with 0 refills. Please approve or deny  Pharmacy comment: REQUEST FOR 90 DAYS PRESCRIPTION. DX Code Needed. Please Advise?

## 2024-11-10 ENCOUNTER — Encounter (HOSPITAL_BASED_OUTPATIENT_CLINIC_OR_DEPARTMENT_OTHER): Payer: Self-pay

## 2024-11-10 ENCOUNTER — Ambulatory Visit (HOSPITAL_BASED_OUTPATIENT_CLINIC_OR_DEPARTMENT_OTHER): Admit: 2024-11-10 | Admitting: Orthopedic Surgery

## 2024-11-10 SURGERY — EXPLORATION, TENDON
Anesthesia: Monitor Anesthesia Care | Laterality: Right

## 2024-12-21 ENCOUNTER — Ambulatory Visit: Admitting: Nurse Practitioner

## 2025-02-23 ENCOUNTER — Ambulatory Visit: Admitting: Nurse Practitioner

## 2025-03-11 ENCOUNTER — Ambulatory Visit: Admitting: Nurse Practitioner
